# Patient Record
Sex: Male | Born: 1980
Health system: Southern US, Community
[De-identification: ages and names within clinical notes are randomized; demographics above are authoritative.]

## PROBLEM LIST (undated history)

## (undated) DIAGNOSIS — S329XXA Fracture of unspecified parts of lumbosacral spine and pelvis, initial encounter for closed fracture: Secondary | ICD-10-CM

## (undated) DIAGNOSIS — F419 Anxiety disorder, unspecified: Secondary | ICD-10-CM

## (undated) DIAGNOSIS — F909 Attention-deficit hyperactivity disorder, unspecified type: Secondary | ICD-10-CM

## (undated) DIAGNOSIS — H209 Unspecified iridocyclitis: Secondary | ICD-10-CM

## (undated) DIAGNOSIS — I10 Essential (primary) hypertension: Secondary | ICD-10-CM

## (undated) DIAGNOSIS — J939 Pneumothorax, unspecified: Secondary | ICD-10-CM

## (undated) HISTORY — PX: WISDOM TOOTH EXTRACTION: SHX21

## (undated) HISTORY — DX: Fracture of unspecified parts of lumbosacral spine and pelvis, initial encounter for closed fracture: S32.9XXA

## (undated) HISTORY — DX: Unspecified iridocyclitis: H20.9

## (undated) HISTORY — DX: Attention-deficit hyperactivity disorder, unspecified type: F90.9

## (undated) HISTORY — DX: Pneumothorax, unspecified: J93.9

## (undated) HISTORY — DX: Anxiety disorder, unspecified: F41.9

---
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
## 2008-05-14 ENCOUNTER — Ambulatory Visit: Payer: Self-pay | Admitting: Internal Medicine

## 2011-10-04 ENCOUNTER — Emergency Department (HOSPITAL_COMMUNITY)
Admission: EM | Admit: 2011-10-04 | Discharge: 2011-10-05 | Disposition: A | Discharge status: Home / Self Care | Payer: Self-pay | Attending: Emergency Medicine | Admitting: Emergency Medicine

## 2011-10-04 ENCOUNTER — Encounter: Payer: Self-pay | Admitting: *Deleted

## 2011-10-04 DIAGNOSIS — H11419 Vascular abnormalities of conjunctiva, unspecified eye: Secondary | ICD-10-CM | POA: Insufficient documentation

## 2011-10-04 DIAGNOSIS — H5789 Other specified disorders of eye and adnexa: Secondary | ICD-10-CM | POA: Insufficient documentation

## 2011-10-04 DIAGNOSIS — H209 Unspecified iridocyclitis: Secondary | ICD-10-CM | POA: Insufficient documentation

## 2011-10-04 DIAGNOSIS — H53149 Visual discomfort, unspecified: Secondary | ICD-10-CM | POA: Insufficient documentation

## 2011-10-04 DIAGNOSIS — H5710 Ocular pain, unspecified eye: Secondary | ICD-10-CM | POA: Insufficient documentation

## 2011-10-04 DIAGNOSIS — H538 Other visual disturbances: Secondary | ICD-10-CM | POA: Insufficient documentation

## 2011-10-04 MED ORDER — OXYCODONE-ACETAMINOPHEN 5-325 MG PO TABS: 1.0000 | ORAL_TABLET | Freq: Four times a day (QID) | ORAL | Status: AC | PRN

## 2011-10-04 MED ORDER — TETRACAINE HCL 0.5 % OP SOLN
2.0000 [drp] | Freq: Once | OPHTHALMIC | Status: AC
  Administered 2011-10-04: 2 [drp] via OPHTHALMIC
  Filled 2011-10-04: qty 2

## 2011-10-04 MED ORDER — FLUORESCEIN SODIUM 1 MG OP STRP
ORAL_STRIP | OPHTHALMIC | Status: AC
  Administered 2011-10-04: 23:00:00
  Filled 2011-10-04: qty 1

## 2011-10-04 MED ORDER — OXYCODONE-ACETAMINOPHEN 5-325 MG PO TABS
2.0000 | ORAL_TABLET | Freq: Once | ORAL | Status: AC
  Administered 2011-10-04: 2 via ORAL
  Filled 2011-10-04: qty 2

## 2011-10-04 NOTE — ED Notes (Signed)
Patient has been diagnosed with iritis or his right eye around New Years Day; patient was seen at the ED, given pain medication, and sent for a follow up at an eye doctor.  Patient was prescribed Cyclogyl 1% and Pred Forte; patient states that the pain has been getting worse, and that he is unable to keep his right eye open due to light sensitivity.  Patient currently rates pain 5/10 on the numerical pain scale; describes pain as "burning" and "irritation".  Patient is alert and oriented; family present at bedside.  Will continue to monitor.

## 2011-10-04 NOTE — ED Provider Notes (Signed)
History     CSN: [id number]  Arrival date & time 10/04/11  2036   First MD Initiated Contact with Patient 10/04/11 2222      Chief Complaint  Patient presents with  . Eye Pain    iritis of R eye     HPI  History provided by the patient. Patient is a 31 year old male with no significant past medical history who presents with persistent continued right eye pain for the past 6-7 days. Patient is originally from [place] and is here visiting family. Patient states that on New Year's Day he began to have gradually increasing right eye pain as if "someone poked him in the eye". Patient states he went to a hospital in [place] and then was referred to an optometrist there. He reports being diagnosed with iritis and being prescribed Pred forte eyedrops as well as cyclogyl. Patient was using these drops and returned for a followup visit a few days after and states that he continued to have increased redness to the eye and pain. He was told to continue the same drops and have another recheck later on. Today patient states he has almost run out of his eyedrops and he continues to have pain in the eye. He states the pain is worse with any bright lights. He has also been wearing a patch over his eye. He denies any fever or chills. Patient states that his vision seems blurry due to tears and has difficulty looking out of his eye. Patient denies any other symptoms.    History reviewed. No pertinent past medical history.  History reviewed. No pertinent past surgical history.  Family History  Problem Relation Age of Onset  . Hypertension Mother   . Hypertension Father   . Cancer Other     History  Substance Use Topics  . Smoking status: Current Everyday Smoker -- 0.5 packs/day  . Smokeless tobacco: Not on file  . Alcohol Use: No      Review of Systems  Constitutional: Negative for fever and chills.  Eyes: Positive for photophobia, pain, discharge and redness. Negative for itching.    All other systems reviewed and are negative.    Allergies  Vicodin  Home Medications   Current Outpatient Rx  Name Route Sig Dispense Refill  . GOODYS EXTRA STRENGTH PO Oral Take 1 packet by mouth once as needed. For pain     . CYCLOPENTOLATE HCL 1 % OP SOLN Right Eye Place 1 drop into the right eye 3 (three) times daily.      . OXYCODONE-ACETAMINOPHEN 5-325 MG PO TABS Oral Take 1-2 tablets by mouth every 4 (four) hours as needed.      Marland Kitchen PREDNISOLONE ACETATE 1 % OP SUSP Right Eye Place 1 drop into the right eye every hour.        BP 131/90  Pulse 71  Temp(Src) 98.6 F (37 C) (Oral)  Resp 20  SpO2 99%  Physical Exam  Nursing note and vitals reviewed. Constitutional: He is oriented to person, place, and time. He appears well-developed and well-nourished.  HENT:  Head: Normocephalic.  Eyes: EOM are normal. Pupils are equal, round, and reactive to light. Right eye exhibits discharge. Right conjunctiva is injected. Left pupil is round and reactive.  Slit lamp exam:      The right eye shows no corneal abrasion, no corneal flare, no corneal ulcer, no foreign body, no hyphema and no fluorescein uptake.  Cardiovascular: Normal rate and regular rhythm.   Pulmonary/Chest: Effort  normal and breath sounds normal.  Abdominal: Soft.  Neurological: He is alert and oriented to person, place, and time.  Skin: Skin is warm.  Psychiatric: He has a normal mood and affect. His behavior is normal.    ED Course  Procedures     1. Iritis       MDM  10:25 PM patient seen and evaluated. Patient in no acute distress.        Angus Seller, [place] 10/05/11 [phone]

## 2011-10-04 NOTE — ED Notes (Addendum)
C/o R eye pain since 12/31. Sudden onset. Seen at [facility] ED. Pain and sx continue. Mentions: light sensitive, HA, drainage, redness. Has taken 2 percocet, 2 advil, BC powder. Has cyclogel & pred forte. Spoke with a [employer] today.

## 2011-10-04 NOTE — ED Notes (Signed)
PA at bedside.

## 2011-10-04 NOTE — ED Notes (Signed)
Patient currently sitting up in bed; no respiratory or acute distress noted.  Patient updated on plan of care; informed patient that tetracaine and fluorescein strip has been placed at bedside for PA use.  Patient has no other questions or concerns at this time.  Will continue to monitor.

## 2011-10-06 NOTE — ED Provider Notes (Signed)
Medical screening examination/treatment/procedure(s) were performed by non-physician practitioner and as supervising physician I was immediately available for consultation/collaboration.    Suzi Roots, MD 10/06/11 [phone]

## 2012-12-20 ENCOUNTER — Emergency Department: Payer: Self-pay | Admitting: Emergency Medicine

## 2014-03-19 ENCOUNTER — Emergency Department: Payer: Self-pay | Admitting: Emergency Medicine

## 2014-03-19 LAB — HEPATIC FUNCTION PANEL A (ARMC)
Albumin: 4.6 g/dL (ref 3.4–5.0)
Alkaline Phosphatase: 75 U/L
Bilirubin, Direct: 0.1 mg/dL (ref 0.00–0.20)
Bilirubin,Total: 0.5 mg/dL (ref 0.2–1.0)
SGOT(AST): 55 U/L — ABNORMAL HIGH (ref 15–37)
SGPT (ALT): 40 U/L (ref 12–78)
Total Protein: 8.9 g/dL — ABNORMAL HIGH (ref 6.4–8.2)

## 2014-03-19 LAB — CBC WITH DIFFERENTIAL/PLATELET
BASOS ABS: 0.1 10*3/uL (ref 0.0–0.1)
BASOS PCT: 0.8 %
Eosinophil #: 0 10*3/uL (ref 0.0–0.7)
Eosinophil %: 0 %
HCT: 48.9 % (ref 40.0–52.0)
HGB: 16.1 g/dL (ref 13.0–18.0)
Lymphocyte #: 0.9 10*3/uL — ABNORMAL LOW (ref 1.0–3.6)
Lymphocyte %: 13.2 %
MCH: 30.4 pg (ref 26.0–34.0)
MCHC: 33 g/dL (ref 32.0–36.0)
MCV: 92 fL (ref 80–100)
MONOS PCT: 8.1 %
Monocyte #: 0.5 x10 3/mm (ref 0.2–1.0)
NEUTROS ABS: 5.1 10*3/uL (ref 1.4–6.5)
NEUTROS PCT: 77.9 %
PLATELETS: 196 10*3/uL (ref 150–440)
RBC: 5.3 10*6/uL (ref 4.40–5.90)
RDW: 12.7 % (ref 11.5–14.5)
WBC: 6.6 10*3/uL (ref 3.8–10.6)

## 2014-03-19 LAB — BASIC METABOLIC PANEL
ANION GAP: 13 (ref 7–16)
BUN: 27 mg/dL — ABNORMAL HIGH (ref 7–18)
CALCIUM: 10 mg/dL (ref 8.5–10.1)
CHLORIDE: 103 mmol/L (ref 98–107)
CO2: 17 mmol/L — AB (ref 21–32)
Creatinine: 1.26 mg/dL (ref 0.60–1.30)
EGFR (African American): >60
GLUCOSE: 129 mg/dL — AB (ref 65–99)
Osmolality: 273 (ref 275–301)
POTASSIUM: 3.9 mmol/L (ref 3.5–5.1)
SODIUM: 133 mmol/L — AB (ref 136–145)

## 2014-03-19 LAB — URINALYSIS, COMPLETE
BILIRUBIN, UR: NEGATIVE
GLUCOSE, UR: NEGATIVE mg/dL (ref 0–75)
Leukocyte Esterase: NEGATIVE
Nitrite: NEGATIVE
Ph: 5 (ref 4.5–8.0)
RBC,UR: 12 /HPF (ref 0–5)
SQUAMOUS EPITHELIAL: NONE SEEN
Specific Gravity: 1.03 (ref 1.003–1.030)
WBC UR: 1 /HPF (ref 0–5)

## 2014-03-19 LAB — TROPONIN I

## 2014-03-19 LAB — CK TOTAL AND CKMB (NOT AT ARMC)
CK, Total: 379 U/L — ABNORMAL HIGH
CK-MB: 1.8 ng/mL (ref 0.5–3.6)

## 2014-03-19 LAB — MAGNESIUM: Magnesium: 1.9 mg/dL

## 2014-12-04 DIAGNOSIS — F909 Attention-deficit hyperactivity disorder, unspecified type: Secondary | ICD-10-CM | POA: Insufficient documentation

## 2017-06-27 DIAGNOSIS — H109 Unspecified conjunctivitis: Secondary | ICD-10-CM | POA: Diagnosis not present

## 2017-06-28 ENCOUNTER — Emergency Department
Admission: EM | Admit: 2017-06-28 | Discharge: 2017-06-28 | Disposition: A | Discharge status: Home / Self Care | Payer: Commercial Managed Care - HMO | Attending: Emergency Medicine | Admitting: Emergency Medicine

## 2017-06-28 ENCOUNTER — Encounter: Payer: Self-pay | Admitting: Emergency Medicine

## 2017-06-28 DIAGNOSIS — H209 Unspecified iridocyclitis: Secondary | ICD-10-CM | POA: Diagnosis not present

## 2017-06-28 DIAGNOSIS — H5711 Ocular pain, right eye: Secondary | ICD-10-CM | POA: Diagnosis not present

## 2017-06-28 MED ORDER — OXYCODONE-ACETAMINOPHEN 5-325 MG PO TABS
1.0000 | ORAL_TABLET | Freq: Once | ORAL | Status: AC
  Administered 2017-06-28: 1 via ORAL
  Filled 2017-06-28: qty 1

## 2017-06-28 MED ORDER — FLUORESCEIN SODIUM 1 MG OP STRP
1.0000 | ORAL_STRIP | Freq: Once | OPHTHALMIC | Status: AC
  Administered 2017-06-28: 1 via OPHTHALMIC
  Filled 2017-06-28: qty 1

## 2017-06-28 MED ORDER — KETOROLAC TROMETHAMINE 60 MG/2ML IM SOLN
60.0000 mg | Freq: Once | INTRAMUSCULAR | Status: AC
  Administered 2017-06-28: 60 mg via INTRAMUSCULAR
  Filled 2017-06-28: qty 2

## 2017-06-28 MED ORDER — TETRACAINE HCL 0.5 % OP SOLN
2.0000 [drp] | Freq: Once | OPHTHALMIC | Status: AC
  Administered 2017-06-28: 2 [drp] via OPHTHALMIC
  Filled 2017-06-28: qty 4
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
## 2017-06-28 NOTE — Discharge Instructions (Signed)
Please go to the ophthalmologist office once you're discharged from the emergency department. I am concerned that he may have iritis and he feels that you need to have a full slit-lamp exam and have them prescribe medications.

## 2017-06-28 NOTE — ED Triage Notes (Signed)
Patient to ER for c/o right sided eye pain, photosensitivity, and redness. States redness began on Friday. Developed pain yesterday or early today. Denies any knowledge of anything hitting eye or feeling like something is in eye. Does HVAC for work.

## 2017-06-28 NOTE — ED Notes (Signed)
NAD noted at time of D/C. Pt denies questions or concerns. Pt ambulatory to the lobby at this time.  

## 2017-06-28 NOTE — ED Notes (Signed)
This RN to bedside, pt noted to be resting in bed with lights turned off, pt noted to be moving around in bed, respirations even and unlabored. Will continue to monitor for further patient needs.

## 2017-06-28 NOTE — ED Notes (Signed)
Patient unable to tolerate visual acuity test d/t pain and watering of eye on both sides.

## 2017-06-30 DIAGNOSIS — H209 Unspecified iridocyclitis: Secondary | ICD-10-CM | POA: Diagnosis not present

## 2017-07-15 DIAGNOSIS — H209 Unspecified iridocyclitis: Secondary | ICD-10-CM | POA: Diagnosis not present

## 2017-08-29 DIAGNOSIS — H209 Unspecified iridocyclitis: Secondary | ICD-10-CM | POA: Diagnosis not present

## 2017-11-23 DIAGNOSIS — H209 Unspecified iridocyclitis: Secondary | ICD-10-CM | POA: Diagnosis not present

## 2017-11-25 ENCOUNTER — Encounter: Payer: Self-pay | Admitting: Family Medicine

## 2017-11-25 ENCOUNTER — Ambulatory Visit (INDEPENDENT_AMBULATORY_CARE_PROVIDER_SITE_OTHER): Payer: 59 | Admitting: Family Medicine

## 2017-11-25 VITALS — BP 110/62 | HR 76 | Temp 97.6°F | Resp 16 | Ht 74.0 in | Wt 269.0 lb

## 2017-11-25 DIAGNOSIS — F909 Attention-deficit hyperactivity disorder, unspecified type: Secondary | ICD-10-CM | POA: Diagnosis not present

## 2017-11-25 DIAGNOSIS — Z8669 Personal history of other diseases of the nervous system and sense organs: Secondary | ICD-10-CM | POA: Insufficient documentation

## 2017-11-25 DIAGNOSIS — F419 Anxiety disorder, unspecified: Secondary | ICD-10-CM | POA: Insufficient documentation

## 2017-11-25 DIAGNOSIS — M546 Pain in thoracic spine: Secondary | ICD-10-CM

## 2017-11-25 DIAGNOSIS — G8929 Other chronic pain: Secondary | ICD-10-CM

## 2017-11-25 DIAGNOSIS — M459 Ankylosing spondylitis of unspecified sites in spine: Secondary | ICD-10-CM | POA: Insufficient documentation

## 2017-11-25 DIAGNOSIS — M545 Low back pain: Secondary | ICD-10-CM

## 2017-11-25 MED ORDER — AMPHETAMINE-DEXTROAMPHETAMINE 20 MG PO TABS: 10.0000 mg | ORAL_TABLET | Freq: Three times a day (TID) | ORAL | 0 refills | Status: DC
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
## 2017-11-25 NOTE — Assessment & Plan Note (Signed)
Suspect this is related to poor core strength, poor posture and lack of stretching Advised that this is all MSK and not neuropathic Advised that he is ok to start working out again Recommend yoga, core strengthening and aerobics in addition to weight training Could consider PT if needs additional help

## 2017-11-25 NOTE — Assessment & Plan Note (Signed)
Uncontrolled Was previously doing well on Adderall10mg  TID 3 month supply given F/u in 3 months

## 2017-11-25 NOTE — Assessment & Plan Note (Signed)
Uncontrolled, but stable Could be related to uncontrolled ADHD and chaotic life Will f/u after stable on Adderall Given coping strategies Consider therapy and SSRI

## 2017-11-25 NOTE — Assessment & Plan Note (Signed)
Doing well currently Continue to follow with [facility] annual eye exams

## 2017-11-25 NOTE — Patient Instructions (Signed)

## 2018-03-06 ENCOUNTER — Encounter: Payer: Self-pay | Admitting: Family Medicine
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
## 2018-03-10 ENCOUNTER — Ambulatory Visit (INDEPENDENT_AMBULATORY_CARE_PROVIDER_SITE_OTHER): Payer: 59 | Admitting: Family Medicine

## 2018-03-10 ENCOUNTER — Encounter: Payer: Self-pay | Admitting: Family Medicine

## 2018-03-10 VITALS — BP 130/84 | HR 79 | Temp 98.0°F | Resp 16 | Ht 74.0 in | Wt 246.0 lb

## 2018-03-10 DIAGNOSIS — Z113 Encounter for screening for infections with a predominantly sexual mode of transmission: Secondary | ICD-10-CM

## 2018-03-10 DIAGNOSIS — Z0000 Encounter for general adult medical examination without abnormal findings: Secondary | ICD-10-CM

## 2018-03-10 DIAGNOSIS — Z23 Encounter for immunization: Secondary | ICD-10-CM | POA: Diagnosis not present

## 2018-03-10 DIAGNOSIS — F909 Attention-deficit hyperactivity disorder, unspecified type: Secondary | ICD-10-CM | POA: Diagnosis not present

## 2018-03-10 DIAGNOSIS — Z114 Encounter for screening for human immunodeficiency virus [HIV]: Secondary | ICD-10-CM

## 2018-03-10 MED ORDER — AMPHETAMINE-DEXTROAMPHETAMINE 20 MG PO TABS: 10.0000 mg | ORAL_TABLET | Freq: Three times a day (TID) | ORAL | 0 refills | Status: DC

## 2018-03-10 NOTE — Patient Instructions (Signed)
Preventive Care 18-39 Years, Male Preventive care refers to lifestyle choices and visits with your health care provider that can promote health and wellness. What does preventive care include?  A yearly physical exam. This is also called an annual well check.  Dental exams once or twice a year.  Routine eye exams. Ask your health care provider how often you should have your eyes checked.  Personal lifestyle choices, including: ? Daily care of your teeth and gums. ? Regular physical activity. ? Eating a healthy diet. ? Avoiding tobacco and drug use. ? Limiting alcohol use. ? Practicing safe sex. What happens during an annual well check? The services and screenings done by your health care provider during your annual well check will depend on your age, overall health, lifestyle risk factors, and family history of disease. Counseling Your health care provider may ask you questions about your:  Alcohol use.  Tobacco use.  Drug use.  Emotional well-being.  Home and relationship well-being.  Sexual activity.  Eating habits.  Work and work Statistician.  Screening You may have the following tests or measurements:  Height, weight, and BMI.  Blood pressure.  Lipid and cholesterol levels. These may be checked every 5 years starting at age 34.  Diabetes screening. This is done by checking your blood sugar (glucose) after you have not eaten for a while (fasting).  Skin check.  Hepatitis C blood test.  Hepatitis B blood test.  Sexually transmitted disease (STD) testing.  Discuss your test results, treatment options, and if necessary, the need for more tests with your health care provider. Vaccines Your health care provider may recommend certain vaccines, such as:  Influenza vaccine. This is recommended every year.  Tetanus, diphtheria, and acellular pertussis (Tdap, Td) vaccine. You may need a Td booster every 10 years.  Varicella vaccine. You may need this if you  have not been vaccinated.  HPV vaccine. If you are 23 or younger, you may need three doses over 6 months.  Measles, mumps, and rubella (MMR) vaccine. You may need at least one dose of MMR.You may also need a second dose.  Pneumococcal 13-valent conjugate (PCV13) vaccine. You may need this if you have certain conditions and have not been vaccinated.  Pneumococcal polysaccharide (PPSV23) vaccine. You may need one or two doses if you smoke cigarettes or if you have certain conditions.  Meningococcal vaccine. One dose is recommended if you are age 65-21 years and a first-year college student living in a residence hall, or if you have one of several medical conditions. You may also need additional booster doses.  Hepatitis A vaccine. You may need this if you have certain conditions or if you travel or work in places where you may be exposed to hepatitis A.  Hepatitis B vaccine. You may need this if you have certain conditions or if you travel or work in places where you may be exposed to hepatitis B.  Haemophilus influenzae type b (Hib) vaccine. You may need this if you have certain risk factors.  Talk to your health care provider about which screenings and vaccines you need and how often you need them. This information is not intended to replace advice given to you by your health care provider. Make sure you discuss any questions you have with your health care provider. Document Released: 11/09/2001 Document Revised: 06/02/2016 Document Reviewed: 07/15/2015 Elsevier Interactive Patient Education  [employer].

## 2018-03-10 NOTE — Assessment & Plan Note (Signed)
Well controlled but does not seem to have as good of symptom control with generic medication Will write for brand name only medication Will continue current dose F/u in 3 months
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
## 2018-03-11 LAB — SPECIMEN STATUS REPORT

## 2018-03-11 LAB — CHLAMYDIA/GONOCOCCUS/TRICHOMONAS, NAA
Chlamydia by NAA: NEGATIVE
Gonococcus by NAA: NEGATIVE
Trich vag by NAA: NEGATIVE

## 2018-03-13 ENCOUNTER — Telehealth: Payer: Self-pay

## 2018-03-13 NOTE — Telephone Encounter (Signed)
-----   Message from Virginia Crews, MD sent at 03/13/2018  9:10 AM EDT ----- GC/CT and trichomonas negative.  Blood work not done yet  Brita Romp, Dionne Bucy, MD, MPH [facility] 03/13/2018 9:10 AM

## 2018-03-13 NOTE — Telephone Encounter (Signed)
Pt advised. Is planning on coming on this week or beginning of next week to have labs drawn.

## 2018-03-14 ENCOUNTER — Other Ambulatory Visit: Payer: Self-pay

## 2018-03-14 ENCOUNTER — Ambulatory Visit
Admission: RE | Admit: 2018-03-14 | Discharge: 2018-03-14 | Disposition: A | Discharge status: Home / Self Care | Payer: 59 | Source: Ambulatory Visit | Attending: Ophthalmology | Admitting: Ophthalmology

## 2018-03-14 ENCOUNTER — Other Ambulatory Visit
Admission: RE | Admit: 2018-03-14 | Discharge: 2018-03-14 | Disposition: A | Discharge status: Home / Self Care | Payer: 59 | Source: Ambulatory Visit | Attending: Ophthalmology | Admitting: Ophthalmology

## 2018-03-14 ENCOUNTER — Other Ambulatory Visit: Payer: Self-pay | Admitting: Physician Assistant

## 2018-03-14 ENCOUNTER — Other Ambulatory Visit: Payer: Self-pay | Admitting: Ophthalmology

## 2018-03-14 DIAGNOSIS — R748 Abnormal levels of other serum enzymes: Secondary | ICD-10-CM | POA: Diagnosis not present

## 2018-03-14 DIAGNOSIS — J984 Other disorders of lung: Secondary | ICD-10-CM | POA: Diagnosis not present

## 2018-03-14 DIAGNOSIS — H209 Unspecified iridocyclitis: Secondary | ICD-10-CM | POA: Insufficient documentation

## 2018-03-14 DIAGNOSIS — Z1159 Encounter for screening for other viral diseases: Secondary | ICD-10-CM

## 2018-03-14 DIAGNOSIS — D869 Sarcoidosis, unspecified: Secondary | ICD-10-CM

## 2018-03-14 DIAGNOSIS — Z0000 Encounter for general adult medical examination without abnormal findings: Secondary | ICD-10-CM | POA: Diagnosis not present

## 2018-03-15 LAB — LIPID PANEL
CHOL/HDL RATIO: 4.3 ratio (ref 0.0–5.0)
CHOLESTEROL TOTAL: 197 mg/dL (ref 100–199)
HDL: 46 mg/dL (ref >39)
LDL CALC: 129 mg/dL — AB (ref 0–99)
TRIGLYCERIDES: 110 mg/dL (ref 0–149)
VLDL Cholesterol Cal: 22 mg/dL (ref 5–40)

## 2018-03-15 LAB — CBC
HEMATOCRIT: 40.7 % (ref 37.5–51.0)
HEMOGLOBIN: 13.7 g/dL (ref 13.0–17.7)
MCH: 31.4 pg (ref 26.6–33.0)
MCHC: 33.7 g/dL (ref 31.5–35.7)
MCV: 93 fL (ref 79–97)
Platelets: 304 10*3/uL (ref 150–450)
RBC: 4.37 x10E6/uL (ref 4.14–5.80)
RDW: 12.8 % (ref 12.3–15.4)
WBC: 8.4 10*3/uL (ref 3.4–10.8)

## 2018-03-15 LAB — COMPREHENSIVE METABOLIC PANEL
A/G RATIO: 1.5 (ref 1.2–2.2)
ALT: 20 IU/L (ref 0–44)
AST: 24 IU/L (ref 0–40)
Albumin: 4.3 g/dL (ref 3.5–5.5)
Alkaline Phosphatase: 60 IU/L (ref 39–117)
BUN/Creatinine Ratio: 18 (ref 9–20)
BUN: 20 mg/dL (ref 6–20)
Bilirubin Total: 0.3 mg/dL (ref 0.0–1.2)
CALCIUM: 9.2 mg/dL (ref 8.7–10.2)
CO2: 23 mmol/L (ref 20–29)
Chloride: 103 mmol/L (ref 96–106)
Creatinine, Ser: 1.1 mg/dL (ref 0.76–1.27)
GFR, EST AFRICAN AMERICAN: 101 mL/min/{1.73_m2} (ref >59)
GFR, EST NON AFRICAN AMERICAN: 87 mL/min/{1.73_m2} (ref >59)
Globulin, Total: 2.8 g/dL (ref 1.5–4.5)
Glucose: 95 mg/dL (ref 65–99)
Potassium: 4.4 mmol/L (ref 3.5–5.2)
Sodium: 143 mmol/L (ref 134–144)
TOTAL PROTEIN: 7.1 g/dL (ref 6.0–8.5)

## 2018-03-15 LAB — ANGIOTENSIN CONVERTING ENZYME: Angiotensin-Converting Enzyme: 41 U/L (ref 14–82)

## 2018-03-15 LAB — RPR: RPR Ser Ql: NONREACTIVE

## 2018-03-15 LAB — HIV ANTIBODY (ROUTINE TESTING W REFLEX): HIV SCREEN 4TH GENERATION: NONREACTIVE

## 2018-03-15 LAB — HCV COMMENT:

## 2018-03-15 LAB — HEPATITIS C ANTIBODY (REFLEX)

## 2018-03-16 ENCOUNTER — Telehealth: Payer: Self-pay | Admitting: Family Medicine

## 2018-03-16 NOTE — Telephone Encounter (Signed)
He does not need a hepatitis panel for screening.  He did have negative Hep C and would have been vaccinated against Hep B as a child.  Virginia Crews, MD, MPH [facility] 03/16/2018 10:09 AM

## 2018-03-16 NOTE — Telephone Encounter (Signed)
-----   Message from Virginia Crews, MD sent at 03/15/2018 10:19 AM EDT ----- Normal kidney function, liver function, electrolytes, Blood counts.  Negative STD screening. Cholesterol is slightly high.  This will improve with diet low in saturated fat and regular exercise.  Virginia Crews, MD, MPH [facility] 03/15/2018 10:19 AM

## 2018-03-16 NOTE — Telephone Encounter (Signed)
Pt is requesting his lab results from 03/14/18. Please advise. Thanks TNP

## 2018-03-16 NOTE — Telephone Encounter (Signed)
Pt advised of results. He would like screening hepatitis panel checked. Would this be ok to order?

## 2018-03-16 NOTE — Telephone Encounter (Signed)
Pt advised.

## 2018-03-17 LAB — LYSOZYME, SERUM: Lysozyme: 6.1 ug/mL (ref 3.0–12.8)

## 2018-03-17 LAB — HLA-B27 ANTIGEN: HLA-B27: POSITIVE

## 2018-03-21 ENCOUNTER — Encounter: Payer: Self-pay | Admitting: Family Medicine

## 2018-03-21 DIAGNOSIS — H209 Unspecified iridocyclitis: Secondary | ICD-10-CM | POA: Diagnosis not present

## 2018-04-03 ENCOUNTER — Telehealth: Payer: Self-pay

## 2018-04-03 DIAGNOSIS — M545 Low back pain: Principal | ICD-10-CM

## 2018-04-03 DIAGNOSIS — Z1589 Genetic susceptibility to other disease: Secondary | ICD-10-CM

## 2018-04-03 DIAGNOSIS — G8929 Other chronic pain: Secondary | ICD-10-CM

## 2018-04-03 NOTE — Telephone Encounter (Signed)
Received note from [facility] regarding pt's uveitis and complaint of back pain. If pt is still experiencing back pain, Dr. Jacinto Reap is requesting xrays and a referral to rheumatology to work up possible ankylosing spondylitis. LMTCB to ask is he is still having back pain.

## 2018-04-11 DIAGNOSIS — H209 Unspecified iridocyclitis: Secondary | ICD-10-CM | POA: Diagnosis not present

## 2018-04-13 DIAGNOSIS — Z1589 Genetic susceptibility to other disease: Secondary | ICD-10-CM | POA: Insufficient documentation

## 2018-04-13 NOTE — Telephone Encounter (Signed)
Yes, pt was advised where to go for imaging, and that he may go at his convenience.

## 2018-04-13 NOTE — Telephone Encounter (Signed)
Pt is experiencing back pain still. Agrees to xray and referral. Please order.

## 2018-04-13 NOTE — Telephone Encounter (Addendum)
As Raquel Sarna said below, we received a letter from Dr. Edison Pace at [facility] eye regarding the patient's recurrent episodes of acute anterior uveitis of the right eye.  He has had about 3 episodes of this over the last 6 years.  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  He had mentioned back pain to his ophthalmologist who is concerned about possibility for ankylosing spondylitis given his HLA-B27 positive status.  As the patient is having back pain, I will order lumbar spine x-rays and refer him to a rheumatologist for further evaluation and treatment of his back pain.  Letter from [facility] will be scanned into the chart.  Does patient know to go to [facility] on [address] to get XRays?  Virginia Crews, MD, MPH [facility] 04/13/2018 11:23 AM

## 2018-05-04 DIAGNOSIS — H209 Unspecified iridocyclitis: Secondary | ICD-10-CM | POA: Diagnosis not present

## 2018-05-04 DIAGNOSIS — M546 Pain in thoracic spine: Secondary | ICD-10-CM | POA: Diagnosis not present

## 2018-05-04 DIAGNOSIS — M533 Sacrococcygeal disorders, not elsewhere classified: Secondary | ICD-10-CM | POA: Diagnosis not present

## 2018-05-04 DIAGNOSIS — M47816 Spondylosis without myelopathy or radiculopathy, lumbar region: Secondary | ICD-10-CM | POA: Diagnosis not present

## 2018-05-04 DIAGNOSIS — G8929 Other chronic pain: Secondary | ICD-10-CM | POA: Insufficient documentation

## 2018-05-09 DIAGNOSIS — H209 Unspecified iridocyclitis: Secondary | ICD-10-CM | POA: Diagnosis not present

## 2018-06-12 ENCOUNTER — Ambulatory Visit (INDEPENDENT_AMBULATORY_CARE_PROVIDER_SITE_OTHER): Payer: 59 | Admitting: Family Medicine

## 2018-06-12 ENCOUNTER — Encounter: Payer: Self-pay | Admitting: Family Medicine

## 2018-06-12 VITALS — BP 138/82 | HR 90 | Temp 98.8°F | Wt 241.6 lb

## 2018-06-12 DIAGNOSIS — Z87891 Personal history of nicotine dependence: Secondary | ICD-10-CM | POA: Insufficient documentation

## 2018-06-12 DIAGNOSIS — F17200 Nicotine dependence, unspecified, uncomplicated: Secondary | ICD-10-CM | POA: Diagnosis not present

## 2018-06-12 DIAGNOSIS — M545 Low back pain: Secondary | ICD-10-CM

## 2018-06-12 DIAGNOSIS — F909 Attention-deficit hyperactivity disorder, unspecified type: Secondary | ICD-10-CM

## 2018-06-12 DIAGNOSIS — Z1589 Genetic susceptibility to other disease: Secondary | ICD-10-CM

## 2018-06-12 DIAGNOSIS — G8929 Other chronic pain: Secondary | ICD-10-CM

## 2018-06-12 MED ORDER — AMPHETAMINE-DEXTROAMPHETAMINE 20 MG PO TABS: 20.0000 mg | ORAL_TABLET | Freq: Three times a day (TID) | ORAL | 0 refills | Status: DC

## 2018-06-12 NOTE — Progress Notes (Signed)
Patient: Joshua Bartlett Male    DOB: [birth year]   37 y.o.   MRN: [id number] Visit Date: 06/12/2018  Today's Provider: Lavon Paganini, MD   Chief Complaint  Patient presents with  . ADD Follow Up   Subjective:    I, [name], CMA, am acting as a scribe for Lavon Paganini, MD.   HPI  Follow up for ADD  The patient was last seen for this 3 months ago. Changes made at last visit include advised to continue Adderall.  He reports good compliance with treatment. He states he takes 1.5 tablets in morning, 1 tablet at lunch, 0.5 tablet between 2-3 pm.  If he takes higher doses in the afternoon, it is difficult to sleep.  He has balanced when to eat while taking medication to avoid appetite suppresion. He feels that condition is stable.   He is not having side effects.   HLA-B27 positive and chronic lumbar and thoracic back pain: Has seen Rheumatology.  Doesn't seem to have ankylosing spondylitis, though he has some features.  Will be seen again in 6 months.  He is considering seeing another Rheumatologist for a second opinion (at the behest of his mother).    Tobacco use disorder: Currently smoking 1 ppd.  He has quit in the past by vaping and working out more.  He believes that he has finally convinced himself to try to quit again.  He is too concerned about side effects to take any medications for this, but wonders if there are any other methods for quitting.  He has used the quitline in the past.   Allergies  Allergen Reactions  . Vicodin [Hydrocodone-Acetaminophen] Nausea And Vomiting     Current Outpatient Medications:  .  amphetamine-dextroamphetamine (ADDERALL) 20 MG tablet, Take 0.5 tablets (10 mg total) by mouth 3 (three) times daily., Disp: 45 tablet, Rfl: 0 .  amphetamine-dextroamphetamine (ADDERALL) 20 MG tablet, Take 0.5 tablets (10 mg total) by mouth 3 (three) times daily., Disp: 45 tablet, Rfl: 0 .  amphetamine-dextroamphetamine (ADDERALL) 20 MG tablet,  Take 0.5 tablets (10 mg total) by mouth 3 (three) times daily., Disp: 45 tablet, Rfl: 0   Review of Systems  Constitutional: Negative.   Respiratory: Negative.   Cardiovascular: Negative.   Musculoskeletal: Negative.   Psychiatric/Behavioral: Positive for decreased concentration.    Social History   Tobacco Use  . Smoking status: Current Every Day Smoker    Packs/day: 0.30    Years: 0.00    Pack years: 0.00    Types: Cigarettes  . Smokeless tobacco: Never Used  . Tobacco comment: started smoking at age 38  Substance Use Topics  . Alcohol use: No   Objective:   BP 138/82 (BP Location: Right Arm, Patient Position: Sitting, Cuff Size: Normal)   Pulse 90   Temp 98.8 F (37.1 C) (Oral)   Wt 241 lb 9.6 oz (109.6 kg)   SpO2 96%   BMI 31.02 kg/m  Vitals:   06/12/18 1558  BP: 138/82  Pulse: 90  Temp: 98.8 F (37.1 C)  TempSrc: Oral  SpO2: 96%  Weight: 241 lb 9.6 oz (109.6 kg)     Physical Exam  Constitutional: He is oriented to person, place, and time. He appears well-developed and well-nourished. No distress.  HENT:  Head: Normocephalic and atraumatic.  Mouth/Throat: Oropharynx is clear and moist.  Eyes: Conjunctivae are normal. No scleral icterus.  Neck: Neck supple. No thyromegaly present.  Cardiovascular: Normal rate, regular rhythm, normal heart  sounds and intact distal pulses.  No murmur heard. Pulmonary/Chest: Effort normal and breath sounds normal. No respiratory distress. He has no wheezes. He has no rales.  Musculoskeletal: He exhibits no edema or deformity.  Lymphadenopathy:    He has no cervical adenopathy.  Neurological: He is alert and oriented to person, place, and time.  Skin: Skin is warm and dry. Capillary refill takes less than 2 seconds. No rash noted.  Psychiatric: He has a normal mood and affect. His behavior is normal.  Vitals reviewed.       Assessment & Plan:   Problem List Items Addressed This Visit      Other   Adult ADHD -  Primary    Well-controlled Increase dose to 30 mg in the morning, 20 mg with lunch and 10 mg in the afternoon.  This seems to give him the best symptom control 76-monthprescriptions given Follow-up in 3 months      Chronic lumbar pain    Does not seem to be related to his HLA-B27 positive status or ankylosing spondylitis Given his line of work, it is most likely that he has MSK back pain As in the past, I recommended yoga, core strengthening, and aerobics in addition to his weight training He may seek a second opinion from another rheumatologist We could consider physical therapy in the future as well      HLA B27 positive    As above, patient does not seem to have ankylosing spondylitis He may seek a second opinion from another rheumatologist He will let me know if he needs a referral for this      Tobacco use disorder    3 to 5-minute discussion regarding the harms of smoking, the benefits of cessation, and methods of cessation He is not interested in Zyban or Chantix Discussed use of patches, gum, lozenges, and inhalers Discussed dangers of vaping and do not advise this method for cessation Discussed use of the quit line and how this can be helpful to have someone hold you accountable Exline we will follow-up at next visit          Return in about 3 months (around 09/11/2018) for ADD f/u.   The entirety of the information documented in the History of Present Illness, Review of Systems and Physical Exam were personally obtained by me. Portions of this information were initially documented by NTiburcio Pea CMA and reviewed by me for thoroughness and accuracy.    BVirginia Crews MD, MPH [facility]9/16/2019 4:52 PM

## 2018-06-12 NOTE — Assessment & Plan Note (Signed)
As above, patient does not seem to have ankylosing spondylitis He may seek a second opinion from another rheumatologist He will let me know if he needs a referral for this

## 2018-06-12 NOTE — Assessment & Plan Note (Signed)
3 to 5-minute discussion regarding the harms of smoking, the benefits of cessation, and methods of cessation He is not interested in Zyban or Chantix Discussed use of patches, gum, lozenges, and inhalers Discussed dangers of vaping and do not advise this method for cessation Discussed use of the quit line and how this can be helpful to have someone hold you accountable Exline we will follow-up at next visit

## 2018-06-12 NOTE — Assessment & Plan Note (Signed)
Does not seem to be related to his HLA-B27 positive status or ankylosing spondylitis Given his line of work, it is most likely that he has MSK back pain As in the past, I recommended yoga, core strengthening, and aerobics in addition to his weight training He may seek a second opinion from another rheumatologist We could consider physical therapy in the future as well

## 2018-06-12 NOTE — Assessment & Plan Note (Signed)
Well-controlled Increase dose to 30 mg in the morning, 20 mg with lunch and 10 mg in the afternoon.  This seems to give him the best symptom control 59-month prescriptions given Follow-up in 3 months

## 2018-06-12 NOTE — Patient Instructions (Signed)
Consider calling the Quitline 1-800-QUIT-NOW  Coping with Quitting Smoking Quitting smoking is a physical and mental challenge. You will face cravings, withdrawal symptoms, and temptation. Before quitting, work with your health care provider to make a plan that can help you cope. Preparation can help you quit and keep you from giving in. How can I cope with cravings? Cravings usually last for 5-10 minutes. If you get through it, the craving will pass. Consider taking the following actions to help you cope with cravings:  Keep your mouth busy: ? Chew sugar-free gum. ? Suck on hard candies or a straw. ? Brush your teeth.  Keep your hands and body busy: ? Immediately change to a different activity when you feel a craving. ? Squeeze or play with a ball. ? Do an activity or a hobby, like making bead jewelry, practicing needlepoint, or working with wood. ? Mix up your normal routine. ? Take a short exercise break. Go for a quick walk or run up and down stairs. ? Spend time in public places where smoking is not allowed.  Focus on doing something kind or helpful for someone else.  Call a friend or family member to talk during a craving.  Join a support group.  Call a quit line, such as 1-800-QUIT-NOW.  Talk with your health care provider about medicines that might help you cope with cravings and make quitting easier for you.  How can I deal with withdrawal symptoms? Your body may experience negative effects as it tries to get used to not having nicotine in the system. These effects are called withdrawal symptoms. They may include:  Feeling hungrier than normal.  Trouble concentrating.  Irritability.  Trouble sleeping.  Feeling depressed.  Restlessness and agitation.  Craving a cigarette.  To manage withdrawal symptoms:  Avoid places, people, and activities that trigger your cravings.  Remember why you want to quit.  Get plenty of sleep.  Avoid coffee and other  caffeinated drinks. These may worsen some of your symptoms.  How can I handle social situations? Social situations can be difficult when you are quitting smoking, especially in the first few weeks. To manage this, you can:  Avoid parties, bars, and other social situations where people might be smoking.  Avoid alcohol.  Leave right away if you have the urge to smoke.  Explain to your family and friends that you are quitting smoking. Ask for understanding and support.  Plan activities with friends or family where smoking is not an option.  What are some ways I can cope with stress? Wanting to smoke may cause stress, and stress can make you want to smoke. Find ways to manage your stress. Relaxation techniques can help. For example:  Breathe slowly and deeply, in through your nose and out through your mouth.  Listen to soothing, relaxing music.  Talk with a family member or friend about your stress.  Light a candle.  Soak in a bath or take a shower.  Think about a peaceful place.  What are some ways I can prevent weight gain? Be aware that many people gain weight after they quit smoking. However, not everyone does. To keep from gaining weight, have a plan in place before you quit and stick to the plan after you quit. Your plan should include:  Having healthy snacks. When you have a craving, it may help to: ? Eat plain popcorn, crunchy carrots, celery, or other cut vegetables. ? Chew sugar-free gum.  Changing how you eat: ? Eat  small portion sizes at meals. ? Eat 4-6 small meals throughout the day instead of 1-2 large meals a day. ? Be mindful when you eat. Do not watch television or do other things that might distract you as you eat.  Exercising regularly: ? Make time to exercise each day. If you do not have time for a long workout, do short bouts of exercise for 5-10 minutes several times a day. ? Do some form of strengthening exercise, like weight lifting, and some form of  aerobic exercise, like running or swimming.  Drinking plenty of water or other low-calorie or no-calorie drinks. Drink 6-8 glasses of water daily, or as much as instructed by your health care provider.  Summary  Quitting smoking is a physical and mental challenge. You will face cravings, withdrawal symptoms, and temptation to smoke again. Preparation can help you as you go through these challenges.  You can cope with cravings by keeping your mouth busy (such as by chewing gum), keeping your body and hands busy, and making calls to family, friends, or a helpline for people who want to quit smoking.  You can cope with withdrawal symptoms by avoiding places where people smoke, avoiding drinks with caffeine, and getting plenty of rest.  Ask your health care provider about the different ways to prevent weight gain, avoid stress, and handle social situations. This information is not intended to replace advice given to you by your health care provider. Make sure you discuss any questions you have with your health care provider. Document Released: 09/10/2016 Document Revised: 09/10/2016 Document Reviewed: 09/10/2016 Elsevier Interactive Patient Education  [employer].

## 2018-09-11 ENCOUNTER — Encounter: Payer: Self-pay | Admitting: Family Medicine

## 2018-09-11 ENCOUNTER — Ambulatory Visit (INDEPENDENT_AMBULATORY_CARE_PROVIDER_SITE_OTHER): Payer: 59 | Admitting: Family Medicine

## 2018-09-11 VITALS — BP 114/74 | HR 83 | Temp 98.5°F | Wt 237.2 lb

## 2018-09-11 DIAGNOSIS — F909 Attention-deficit hyperactivity disorder, unspecified type: Secondary | ICD-10-CM | POA: Diagnosis not present

## 2018-09-11 DIAGNOSIS — K439 Ventral hernia without obstruction or gangrene: Secondary | ICD-10-CM | POA: Diagnosis not present

## 2018-09-11 MED ORDER — AMPHETAMINE-DEXTROAMPHETAMINE 20 MG PO TABS: 20.0000 mg | ORAL_TABLET | Freq: Three times a day (TID) | ORAL | 0 refills | Status: DC

## 2018-09-11 NOTE — Patient Instructions (Signed)
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
## 2018-09-13 DIAGNOSIS — K439 Ventral hernia without obstruction or gangrene: Secondary | ICD-10-CM | POA: Insufficient documentation

## 2018-09-13 NOTE — Assessment & Plan Note (Signed)
Well controlled Continue adderall at current dose 15-month Rx given F/u in 3 months

## 2018-09-13 NOTE — Assessment & Plan Note (Signed)
Present for several years, but new diagnosis Stable, easily reducible Discussed seeing Surgery, but patient would like to wait on this Will continue to monitor Discussed signs of incarceration

## 2018-09-25 DIAGNOSIS — H02403 Unspecified ptosis of bilateral eyelids: Secondary | ICD-10-CM | POA: Diagnosis not present

## 2018-10-20 ENCOUNTER — Ambulatory Visit: Payer: 59 | Admitting: Family Medicine

## 2018-10-20 NOTE — Progress Notes (Deleted)
       Patient: Joshua Bartlett Male    DOB: [birth year]   38 y.o.   MRN: [id number] Visit Date: 10/20/2018  Today's Provider: Lavon Paganini, MD   No chief complaint on file.  Subjective:     Back Pain     Allergies  Allergen Reactions  . Vicodin [Hydrocodone-Acetaminophen] Nausea And Vomiting     Current Outpatient Medications:  .  amphetamine-dextroamphetamine (ADDERALL) 20 MG tablet, Take 1 tablet (20 mg total) by mouth 3 (three) times daily., Disp: 90 tablet, Rfl: 0 .  amphetamine-dextroamphetamine (ADDERALL) 20 MG tablet, Take 1 tablet (20 mg total) by mouth 3 (three) times daily., Disp: 90 tablet, Rfl: 0 .  amphetamine-dextroamphetamine (ADDERALL) 20 MG tablet, Take 1 tablet (20 mg total) by mouth 3 (three) times daily., Disp: 90 tablet, Rfl: 0  Review of Systems  Constitutional: Negative.   Respiratory: Negative.   Cardiovascular: Negative.   Musculoskeletal: Positive for back pain.    Social History   Tobacco Use  . Smoking status: Current Every Day Smoker    Packs/day: 0.30    Years: 0.00    Pack years: 0.00    Types: Cigarettes  . Smokeless tobacco: Never Used  . Tobacco comment: started smoking at age 68  Substance Use Topics  . Alcohol use: No      Objective:   There were no vitals taken for this visit. There were no vitals filed for this visit.   Physical Exam      Assessment & Plan        Lavon Paganini, MD  [facility]

## 2018-10-23 ENCOUNTER — Ambulatory Visit: Payer: Self-pay | Admitting: Family Medicine

## 2018-10-24 ENCOUNTER — Encounter: Payer: Self-pay | Admitting: Family Medicine

## 2018-10-24 ENCOUNTER — Ambulatory Visit (INDEPENDENT_AMBULATORY_CARE_PROVIDER_SITE_OTHER): Payer: 59 | Admitting: Family Medicine

## 2018-10-24 VITALS — BP 125/84 | HR 75 | Temp 97.8°F | Wt 223.8 lb

## 2018-10-24 DIAGNOSIS — G8929 Other chronic pain: Secondary | ICD-10-CM

## 2018-10-24 DIAGNOSIS — Z1589 Genetic susceptibility to other disease: Secondary | ICD-10-CM

## 2018-10-24 DIAGNOSIS — M62830 Muscle spasm of back: Secondary | ICD-10-CM | POA: Diagnosis not present

## 2018-10-24 DIAGNOSIS — M546 Pain in thoracic spine: Secondary | ICD-10-CM

## 2018-10-24 MED ORDER — CYCLOBENZAPRINE HCL 5 MG PO TABS: 5.0000 mg | ORAL_TABLET | Freq: Three times a day (TID) | ORAL | 1 refills | Status: DC | PRN

## 2018-10-24 NOTE — Patient Instructions (Addendum)
Flexeril as needed for muscle spasms  Back stretching

## 2018-10-24 NOTE — Progress Notes (Signed)
Patient: Joshua Bartlett Male    DOB: 17-Nov-1980   38 y.o.   MRN: [id number] Visit Date: 10/24/2018  Today's Provider: Lavon Paganini, MD   Chief Complaint  Patient presents with  . Back Pain   Subjective:    I, [name], CMA, am acting as a scribe for Lavon Paganini, MD.    Back Pain  This is a chronic problem. The current episode started more than 1 year ago. The problem occurs intermittently. The pain is present in the thoracic spine. The quality of the pain is described as aching, shooting and stabbing. The pain is at a severity of 10/10. Worse during: pain is worse in the morning. The symptoms are aggravated by lying down. Stiffness is present in the morning.   Patient is HLA B27 positive and has h/o multiple episodes of iritis.  He also has chronic back pain.  He was evaluated by Rheum for concern for possibility of ankylosing spondylitis.  Wanted to f.u in 6 months and recheck.  Xrays were benign at that time.    Allergies  Allergen Reactions  . Vicodin [Hydrocodone-Acetaminophen] Nausea And Vomiting     Current Outpatient Medications:  .  amphetamine-dextroamphetamine (ADDERALL) 20 MG tablet, Take 1 tablet (20 mg total) by mouth 3 (three) times daily., Disp: 90 tablet, Rfl: 0 .  amphetamine-dextroamphetamine (ADDERALL) 20 MG tablet, Take 1 tablet (20 mg total) by mouth 3 (three) times daily., Disp: 90 tablet, Rfl: 0 .  amphetamine-dextroamphetamine (ADDERALL) 20 MG tablet, Take 1 tablet (20 mg total) by mouth 3 (three) times daily., Disp: 90 tablet, Rfl: 0  Review of Systems  Constitutional: Negative.   Respiratory: Negative.   Cardiovascular: Negative.   Musculoskeletal: Positive for back pain.    Social History   Tobacco Use  . Smoking status: Current Every Day Smoker    Packs/day: 0.30    Years: 0.00    Pack years: 0.00    Types: Cigarettes  . Smokeless tobacco: Never Used  . Tobacco comment: started smoking at age 71  Substance Use Topics    . Alcohol use: No      Objective:   BP 125/84 (BP Location: Right Arm, Patient Position: Sitting, Cuff Size: Large)   Pulse 75   Temp 97.8 F (36.6 C) (Oral)   Wt 223 lb 12.8 oz (101.5 kg)   SpO2 96%   BMI 28.73 kg/m  Vitals:   10/24/18 1049  BP: 125/84  Pulse: 75  Temp: 97.8 F (36.6 C)  TempSrc: Oral  SpO2: 96%  Weight: 223 lb 12.8 oz (101.5 kg)     Physical Exam Vitals signs reviewed.  Constitutional:      General: He is not in acute distress.    Appearance: Normal appearance. He is not diaphoretic.  HENT:     Head: Normocephalic and atraumatic.     Mouth/Throat:     Pharynx: Oropharynx is clear.  Eyes:     General: No scleral icterus.    Conjunctiva/sclera: Conjunctivae normal.  Neck:     Musculoskeletal: Neck supple. No muscular tenderness.  Cardiovascular:     Rate and Rhythm: Normal rate and regular rhythm.     Pulses: Normal pulses.     Heart sounds: Normal heart sounds. No murmur.  Pulmonary:     Effort: Pulmonary effort is normal. No respiratory distress.     Breath sounds: Normal breath sounds. No wheezing or rhonchi.  Abdominal:     General: There is no  distension.     Palpations: Abdomen is soft.     Tenderness: There is no abdominal tenderness.  Musculoskeletal:     Right lower leg: No edema.     Left lower leg: No edema.     Comments: Back: No midline tenderness to palpation over spinous processes of cervical, thoracic, lumbar spine.  Range of motion is somewhat limited in hyperextension.  Normal strength and sensation of lower extremities.  Negative straight leg raise bilaterally.  Tightness and mild tenderness of paraspinal musculature of thoracic spine and latissimus dorsi bilaterally  Lymphadenopathy:     Cervical: No cervical adenopathy.  Skin:    General: Skin is warm and dry.     Capillary Refill: Capillary refill takes less than 2 seconds.     Findings: No rash.  Neurological:     Mental Status: He is alert and oriented to person,  place, and time. Mental status is at baseline.  Psychiatric:        Mood and Affect: Mood normal.        Behavior: Behavior normal.        Assessment & Plan   1. Back muscle spasm -Chronic and intermittent problem -He is working on stretching and home exercise program -He does not have any radiculopathy symptoms - I believe that this is a separate issue from his concern for ankylosing spondylitis -We will try Flexeril as needed -Also discussed NSAIDs, rest, ice, heat, stretching -Discussed return precautions  2. HLA B27 (HLA B27 positive) 3. Chronic bilateral thoracic back pain -Chronic problem -There is been some concern about his risk for ankylosing spondylitis given that he is HLA-B27 positive and has a history of multiple episodes of iritis - He would like a second opinion from another rheumatologist, so referral was placed today -As his x-rays were obtained less than 6 months ago by rheumatology, we will not repeat those today - Ambulatory referral to Rheumatology    Meds ordered this encounter  Medications  . cyclobenzaprine (FLEXERIL) 5 MG tablet    Sig: Take 1 tablet (5 mg total) by mouth 3 (three) times daily as needed for muscle spasms.    Dispense:  30 tablet    Refill:  1     Return if symptoms worsen or fail to improve.   The entirety of the information documented in the History of Present Illness, Review of Systems and Physical Exam were personally obtained by me. Portions of this information were initially documented by [name], CMA and reviewed by me for thoroughness and accuracy.    Virginia Crews, MD, MPH [facility] 10/25/2018 1:01 PM

## 2018-11-02 DIAGNOSIS — Z79899 Other long term (current) drug therapy: Secondary | ICD-10-CM | POA: Diagnosis not present

## 2018-11-02 DIAGNOSIS — M5489 Other dorsalgia: Secondary | ICD-10-CM | POA: Diagnosis not present

## 2018-11-02 DIAGNOSIS — H209 Unspecified iridocyclitis: Secondary | ICD-10-CM | POA: Diagnosis not present

## 2018-11-06 ENCOUNTER — Other Ambulatory Visit: Payer: Self-pay | Admitting: Internal Medicine

## 2018-11-06 ENCOUNTER — Other Ambulatory Visit (HOSPITAL_COMMUNITY): Payer: Self-pay | Admitting: Internal Medicine

## 2018-11-06 DIAGNOSIS — M5489 Other dorsalgia: Secondary | ICD-10-CM

## 2018-11-13 ENCOUNTER — Ambulatory Visit: Payer: 59

## 2018-11-17 DIAGNOSIS — H209 Unspecified iridocyclitis: Secondary | ICD-10-CM | POA: Diagnosis not present

## 2018-11-27 ENCOUNTER — Ambulatory Visit: Payer: 59

## 2018-11-28 ENCOUNTER — Ambulatory Visit
Admission: RE | Admit: 2018-11-28 | Discharge: 2018-11-28 | Disposition: A | Discharge status: Home / Self Care | Payer: 59 | Source: Ambulatory Visit | Attending: Family Medicine | Admitting: Family Medicine

## 2018-11-28 ENCOUNTER — Encounter: Payer: Self-pay | Admitting: Family Medicine

## 2018-11-28 ENCOUNTER — Ambulatory Visit
Admission: RE | Admit: 2018-11-28 | Discharge: 2018-11-28 | Disposition: A | Discharge status: Home / Self Care | Payer: 59 | Attending: Family Medicine | Admitting: Family Medicine

## 2018-11-28 ENCOUNTER — Other Ambulatory Visit: Payer: Self-pay

## 2018-11-28 ENCOUNTER — Ambulatory Visit (INDEPENDENT_AMBULATORY_CARE_PROVIDER_SITE_OTHER): Payer: 59 | Admitting: Family Medicine

## 2018-11-28 VITALS — BP 130/77 | HR 64 | Temp 97.7°F | Wt 224.2 lb

## 2018-11-28 DIAGNOSIS — S61309A Unspecified open wound of unspecified finger with damage to nail, initial encounter: Secondary | ICD-10-CM

## 2018-11-28 DIAGNOSIS — S6992XA Unspecified injury of left wrist, hand and finger(s), initial encounter: Secondary | ICD-10-CM

## 2018-11-28 DIAGNOSIS — S62662A Nondisplaced fracture of distal phalanx of right middle finger, initial encounter for closed fracture: Secondary | ICD-10-CM | POA: Diagnosis not present

## 2018-11-28 MED ORDER — DOXYCYCLINE HYCLATE 100 MG PO TABS: 100.0000 mg | ORAL_TABLET | Freq: Two times a day (BID) | ORAL | 0 refills | Status: AC

## 2018-11-28 NOTE — Patient Instructions (Signed)
Nail Avulsion  Nail avulsion is when a nail tears away from the nail bed due to an accident or injury. Nail avulsion can be painful. Your finger or toe may bleed a lot, and you may have some pain, redness, throbbing, and swelling while it heals. Your nail will grow back within several months. Once it grows back, it might not look the same as the old nail. This may happen even after taking good care of it.  Follow these instructions at home:  Wound care  · Follow instructions from your health care provider about how to take care of your wound. Make sure you:  ? Wash your hands with soap and water before and after you change your bandage (dressing). If soap and water are not available, use hand sanitizer.  ? Change your dressing as told by your health care provider.  ? Leave stitches (sutures), skin glue, or adhesive strips in place, if present. These skin closures may need to stay in place for 2 weeks or longer. If adhesive strip edges start to loosen and curl up, you may trim the loose edges. Do not remove adhesive strips completely unless your health care provider tells you to do that.  · Check your wound every day for signs of infection. Check for:  ? Redness, swelling, or pain.  ? Fluid or blood.  ? Warmth.  ? Pus or a bad smell.  · Do not take baths, swim, or use a hot tub until your health care provider approves. Ask your health care provider if you may take showers. You may only be allowed to take sponge baths.  · If you notice bleeding, press gently on the nail bed with a gauze pad. Do this for 15 minutes.  · Keep the wound dry for 48 hours. After 48 hours have passed, lightly wash the finger or toe in warm, soapy water 2-3 times a day. This helps to reduce pain and swelling. It also prevents infection.  Medicine  · Take over-the-counter and prescription medicines only as told by your health care provider. Do not take aspirin or products containing aspirin unless directed by your health care provider. These  products can increase bleeding.  · If you were prescribed an antibiotic medicine, take it or apply it as told by your health care provider. Do not stop taking or using the antibiotic even if you start to feel better.  General instructions    · Keep the injured hand or foot raised above the level of your heart as much as possible in the first 48 hours after the injury. This helps to reduce pain and swelling.  · Move the toe or finger often to avoid stiffness.  · Do not use any products that contain nicotine or tobacco, such as cigarettes, e-cigarettes, and chewing tobacco. These can delay healing. If you need help quitting, ask your health care provider.  Contact a health care provider if:  · You have increased redness, swelling, or pain around your wound.  · You have fluid or blood coming from your wound.  · You have pus or a bad smell coming from your wound.  · Your wound or the area around your wound feels warm to the touch.  Get help right away if:  · You have bleeding that does not stop, even when you apply pressure to the wound.  · You have a temperature that is higher than 100.4°F (38°C).  · The affected finger or toe looks white or black.    Summary  · Nail avulsion is when a nail tears away from the nail bed due to an accident or injury.  · Follow instructions from your health care provider about how to take care of your wound.  · Keep the injured hand or foot raised above the level of your heart as much as possible in the first 48 hours after the injury. This helps to reduce pain and swelling.  · Check your wound every day for signs of infection.  · Contact a health care provider if you have increased redness, swelling, or pain around your wound.  This information is not intended to replace advice given to you by your health care provider. Make sure you discuss any questions you have with your health care provider.  Document Released: 10/21/2004 Document Revised: 05/10/2018 Document Reviewed:  05/10/2018  Elsevier Interactive Patient Education © 2019 [employer]

## 2018-11-28 NOTE — Progress Notes (Signed)
Patient: Joshua Bartlett Male    DOB: 1981-06-01   38 y.o.   MRN: [id number] Visit Date: 11/28/2018  Today's Provider: Lavon Paganini, MD   Chief Complaint  Patient presents with  . Hand Injury   Subjective:     Hand Injury   The incident occurred 3 to 5 days ago. The incident occurred at work. The injury mechanism was a direct blow (hit with hammer). The pain is present in the left hand (middle finger). The quality of the pain is described as aching, burning, cramping, shooting and stabbing. The pain is at a severity of 6/10. The pain is moderate. The pain has been constant since the incident. Associated symptoms comments: Pt reports that the finger is still bleeding as of this am when he changed the bandage.. The symptoms are aggravated by movement. He has tried NSAIDs, acetaminophen and elevation (Pt only took medication on Saturday) for the symptoms. The treatment provided mild relief.    Allergies  Allergen Reactions  . Vicodin [Hydrocodone-Acetaminophen] Nausea And Vomiting     Current Outpatient Medications:  .  amphetamine-dextroamphetamine (ADDERALL) 20 MG tablet, Take 1 tablet (20 mg total) by mouth 3 (three) times daily., Disp: 90 tablet, Rfl: 0 .  amphetamine-dextroamphetamine (ADDERALL) 20 MG tablet, Take 1 tablet (20 mg total) by mouth 3 (three) times daily., Disp: 90 tablet, Rfl: 0 .  amphetamine-dextroamphetamine (ADDERALL) 20 MG tablet, Take 1 tablet (20 mg total) by mouth 3 (three) times daily., Disp: 90 tablet, Rfl: 0 .  cyclobenzaprine (FLEXERIL) 5 MG tablet, Take 1 tablet (5 mg total) by mouth 3 (three) times daily as needed for muscle spasms., Disp: 30 tablet, Rfl: 1  Review of Systems  Constitutional: Negative.   HENT: Negative.   Eyes: Negative.   Respiratory: Negative.   Cardiovascular: Negative.   Gastrointestinal: Negative.   Endocrine: Negative.   Genitourinary: Negative.   Musculoskeletal: Negative.   Skin: Negative.     Allergic/Immunologic: Negative.   Neurological: Negative.   Hematological: Negative.   Psychiatric/Behavioral: Negative.     Social History   Tobacco Use  . Smoking status: Current Every Day Smoker    Packs/day: 0.30    Years: 0.00    Pack years: 0.00    Types: Cigarettes  . Smokeless tobacco: Never Used  . Tobacco comment: started smoking at age 43  Substance Use Topics  . Alcohol use: No      Objective:   BP 130/77 (BP Location: Right Arm, Patient Position: Sitting, Cuff Size: Normal)   Pulse 64   Temp 97.7 F (36.5 C) (Oral)   Wt 224 lb 3.2 oz (101.7 kg)   SpO2 98%   BMI 28.79 kg/m  Vitals:   11/28/18 0855  BP: 130/77  Pulse: 64  Temp: 97.7 F (36.5 C)  TempSrc: Oral  SpO2: 98%  Weight: 224 lb 3.2 oz (101.7 kg)     Physical Exam Vitals signs reviewed.  Constitutional:      General: He is not in acute distress.    Appearance: Normal appearance. He is not diaphoretic.  HENT:     Head: Normocephalic and atraumatic.  Cardiovascular:     Rate and Rhythm: Normal rate and regular rhythm.     Pulses: Normal pulses.  Pulmonary:     Effort: Pulmonary effort is normal. No respiratory distress.  Musculoskeletal:     Right lower leg: No edema.     Left lower leg: No edema.  Comments: L middle finger: Nail avulsed and loosely sitting on top of nail bed.  Ulnar side of nail bed bleeding with some maceration. Significant bruising noted under nail.  Swelling of DIP with inability to flex well. Good cap refill. Sensation intact.  Skin:    General: Skin is warm and dry.     Capillary Refill: Capillary refill takes less than 2 seconds.     Findings: No rash.  Neurological:     Mental Status: He is alert and oriented to person, place, and time. Mental status is at baseline.     Sensory: No sensory deficit.     Motor: No weakness.     Gait: Gait normal.  Psychiatric:        Mood and Affect: Mood normal.        Behavior: Behavior normal.        Assessment &  Plan     1. Injury of finger of left hand, initial encounter 2. Nail avulsion, finger, initial encounter - new problem s/p hitting tip of finger with hammer - nail avulsed and significant bruising and swelling concerning for possible underlying fracture - will obtain XRay - may need Ortho referral pending XRay results - given possibility of open fracture with nail avulsion, will treat empiricially with Doxycycline - DG Finger Middle Left; Future   Meds ordered this encounter  Medications  . doxycycline (VIBRA-TABS) 100 MG tablet    Sig: Take 1 tablet (100 mg total) by mouth 2 (two) times daily for 7 days.    Dispense:  14 tablet    Refill:  0     Return if symptoms worsen or fail to improve.   The entirety of the information documented in the History of Present Illness, Review of Systems and Physical Exam were personally obtained by me. Portions of this information were initially documented by [name] and Lia Foyer, CMA and reviewed by me for thoroughness and accuracy.    Virginia Crews, MD, MPH [facility] 11/28/2018 10:30 AM

## 2018-11-29 ENCOUNTER — Telehealth: Payer: Self-pay

## 2018-11-29 DIAGNOSIS — S62663A Nondisplaced fracture of distal phalanx of left middle finger, initial encounter for closed fracture: Secondary | ICD-10-CM

## 2018-11-29 NOTE — Telephone Encounter (Signed)
-----   Message from Virginia Crews, MD sent at 11/29/2018 11:57 AM EST ----- Does have fracture of his finger.  Should refer to Hand surgery at [facility]. Please place order for phalanx fracture

## 2018-11-29 NOTE — Telephone Encounter (Signed)
Patient was advised and referral placed. 

## 2018-11-30 DIAGNOSIS — S62633A Displaced fracture of distal phalanx of left middle finger, initial encounter for closed fracture: Secondary | ICD-10-CM | POA: Diagnosis not present

## 2018-12-11 ENCOUNTER — Other Ambulatory Visit: Payer: Self-pay | Admitting: Family Medicine

## 2018-12-11 DIAGNOSIS — S62633A Displaced fracture of distal phalanx of left middle finger, initial encounter for closed fracture: Secondary | ICD-10-CM | POA: Diagnosis not present

## 2018-12-11 NOTE — Telephone Encounter (Signed)
Patient called to see if this had been done yet.  While talking to him and telling him there is an office policy that gives [place] 48 hours to refill med, he said that it would be ok he has the hard copy he just thought he could get you to send in another one so he did not have to turn it in and then go back to pick it up.    I told him that if he had the hard copy he would need to use it that we could not call in another.

## 2018-12-11 NOTE — Telephone Encounter (Signed)
Pt needing refill on:  amphetamine-dextroamphetamine (ADDERALL) 20 MG tablet  Please call into:  [facility] Address: [address], [place], [place] [address] Phone: [phone]  Thanks, [facility]

## 2018-12-11 NOTE — Telephone Encounter (Signed)
Looks like 3 months worth were printed in 08/2018.  It appears he has only filled one of these prescriptions.  Will need to use these before more refills given

## 2018-12-15 ENCOUNTER — Ambulatory Visit (INDEPENDENT_AMBULATORY_CARE_PROVIDER_SITE_OTHER): Payer: 59 | Admitting: Family Medicine

## 2018-12-15 DIAGNOSIS — M545 Low back pain: Secondary | ICD-10-CM

## 2018-12-15 DIAGNOSIS — Z1589 Genetic susceptibility to other disease: Secondary | ICD-10-CM

## 2018-12-15 DIAGNOSIS — S62663D Nondisplaced fracture of distal phalanx of left middle finger, subsequent encounter for fracture with routine healing: Secondary | ICD-10-CM | POA: Diagnosis not present

## 2018-12-15 DIAGNOSIS — S62663A Nondisplaced fracture of distal phalanx of left middle finger, initial encounter for closed fracture: Secondary | ICD-10-CM | POA: Insufficient documentation

## 2018-12-15 DIAGNOSIS — G8929 Other chronic pain: Secondary | ICD-10-CM

## 2018-12-15 DIAGNOSIS — F909 Attention-deficit hyperactivity disorder, unspecified type: Secondary | ICD-10-CM

## 2018-12-15 NOTE — Assessment & Plan Note (Signed)
Healing well per Ortho Discussed that fingernail is acting as protective shield for nail bed and to leave in place if possible

## 2018-12-15 NOTE — Assessment & Plan Note (Signed)
Well controlled Continue Adderall at current dose Refill current Rx on hand and can call for further refills F/u in 4 months

## 2018-12-15 NOTE — Assessment & Plan Note (Signed)
Being worked up by rheumatology for HLAB27 + status and possible AS Continue Naproxen and flexeril prn Have discussed exercises and yoga may be helpful

## 2018-12-15 NOTE — Progress Notes (Signed)
Patient: Joshua Bartlett Male    DOB: 01/10/1981   38 y.o.   MRN: [id number] Visit Date: 12/18/2018  Today's Provider: Lavon Paganini, MD   Chief Complaint  Patient presents with  . Follow-up   Subjective:      Virtual Visit via Video Note  I connected with Joshua Bartlett on 12/18/18 at  2:40 PM EDT by a video enabled telemedicine application and verified that I am speaking with the correct person using two identifiers.   I discussed the limitations of evaluation and management by telemedicine and the availability of in person appointments. The patient expressed understanding and agreed to proceed.  . Location of the patient: home . Location of the provider : office - [place]   HPI  Distal phalanx fracture: Office visit 11/28/2018 showed distal phalanx fracture.  Saw Ortho.  Reported it was closed and healing well.  Ok to keep taped and continue to watch.  F/u prn.  HLAB27 positive and back pain: ?Ankylosing spondylitis. Follwoed by Rheum.  Was Rx'd Naproxen, PPI - not taking PPI. Naproxen helps. MRI was ordered but patient did not get as he is waiting on 2nd opinion visit with [facility].  Flexeril also helps some  ADHD F/u - last seen 09/11/18. Doing well on Adderall.  Has not taken consistently.  Has only filled 1 of 3 Rxs from that time.  States that it works well when he is taking it.  Denies HTN, headaches, insomnia, appetite suppression, weight loss.    Allergies  Allergen Reactions  . Vicodin [Hydrocodone-Acetaminophen] Nausea And Vomiting     Current Outpatient Medications:  .  amphetamine-dextroamphetamine (ADDERALL) 20 MG tablet, Take 1 tablet (20 mg total) by mouth 3 (three) times daily., Disp: 90 tablet, Rfl: 0 .  amphetamine-dextroamphetamine (ADDERALL) 20 MG tablet, Take 1 tablet (20 mg total) by mouth 3 (three) times daily., Disp: 90 tablet, Rfl: 0 .  amphetamine-dextroamphetamine (ADDERALL) 20 MG tablet, Take 1 tablet (20 mg total)  by mouth 3 (three) times daily., Disp: 90 tablet, Rfl: 0 .  cyclobenzaprine (FLEXERIL) 5 MG tablet, Take 1 tablet (5 mg total) by mouth 3 (three) times daily as needed for muscle spasms., Disp: 30 tablet, Rfl: 1  Review of Systems  Constitutional: Negative.   HENT: Negative.   Respiratory: Negative.   Cardiovascular: Negative.   Gastrointestinal: Negative.   Genitourinary: Negative.   Musculoskeletal: Positive for arthralgias and back pain. Negative for gait problem.  Neurological: Negative.   Psychiatric/Behavioral: Negative.     Social History   Tobacco Use  . Smoking status: Current Every Day Smoker    Packs/day: 0.30    Years: 0.00    Pack years: 0.00    Types: Cigarettes  . Smokeless tobacco: Never Used  . Tobacco comment: started smoking at age 32  Substance Use Topics  . Alcohol use: No      Objective:    Physical Exam Constitutional:      General: He is not in acute distress.    Appearance: Normal appearance. He is not diaphoretic.  HENT:     Head: Normocephalic and atraumatic.  Eyes:     General: No scleral icterus.    Conjunctiva/sclera: Conjunctivae normal.  Pulmonary:     Effort: Pulmonary effort is normal. No respiratory distress.  Musculoskeletal:     Comments: L middle finger with finger nail avulsed from base and attached at tip. No further bleeding.  Neurological:  Mental Status: He is alert and oriented to person, place, and time. Mental status is at baseline.  Psychiatric:        Mood and Affect: Mood normal.        Behavior: Behavior normal.        Thought Content: Thought content normal.         Assessment & Plan   Problem List Items Addressed This Visit      Musculoskeletal and Integument   Closed nondisplaced fracture of distal phalanx of left middle finger    Healing well per Ortho Discussed that fingernail is acting as protective shield for nail bed and to leave in place if possible        Other   Adult ADHD - Primary     Well controlled Continue Adderall at current dose Refill current Rx on hand and can call for further refills F/u in 4 months      Chronic lumbar pain    Being worked up by rheumatology for HLAB27 + status and possible AS Continue Naproxen and flexeril prn Have discussed exercises and yoga may be helpful      HLA B27 positive    See above for chronic back pain Awaiting 2nd opinion from another Rheum office          Return in about 4 months (around 04/16/2019) for ADD f/u.    Follow Up Instructions:    I discussed the assessment and treatment plan with the patient. The patient was provided an opportunity to ask questions and all were answered. The patient agreed with the plan and demonstrated an understanding of the instructions.   The patient was advised to call back or seek an in-person evaluation if the symptoms worsen or if the condition fails to improve as anticipated.  I provided 25 minutes of non-face-to-face time during this encounter.    Virginia Crews, MD, MPH [facility] 12/18/2018 8:35 AM

## 2018-12-15 NOTE — Patient Instructions (Addendum)
Naproxen is anti-inflammatory - use as needed for back Omeprazole is to protect stomach lining while on Naproxen  Flexeril for muscle spasms as needed.  Fill prescriptions for Adderall.  Let me know when you run out and I will refill at that time.

## 2018-12-18 ENCOUNTER — Encounter: Payer: Self-pay | Admitting: Family Medicine

## 2018-12-18 NOTE — Assessment & Plan Note (Signed)
See above for chronic back pain Awaiting 2nd opinion from another Rheum office

## 2018-12-25 ENCOUNTER — Ambulatory Visit: Payer: Self-pay | Admitting: Rheumatology

## 2019-01-17 ENCOUNTER — Ambulatory Visit: Payer: Self-pay | Admitting: Rheumatology

## 2019-01-18 ENCOUNTER — Telehealth: Payer: Self-pay

## 2019-01-18 NOTE — Telephone Encounter (Signed)
LVM PHQ-2 Screening

## 2019-01-22 ENCOUNTER — Encounter: Payer: Self-pay | Admitting: Family Medicine

## 2019-01-22 ENCOUNTER — Ambulatory Visit (INDEPENDENT_AMBULATORY_CARE_PROVIDER_SITE_OTHER): Payer: 59 | Admitting: Family Medicine

## 2019-01-22 DIAGNOSIS — B354 Tinea corporis: Secondary | ICD-10-CM | POA: Diagnosis not present

## 2019-01-22 MED ORDER — CYCLOBENZAPRINE HCL 5 MG PO TABS: 5.0000 mg | ORAL_TABLET | Freq: Three times a day (TID) | ORAL | 1 refills | Status: DC | PRN

## 2019-01-22 MED ORDER — TERBINAFINE HCL 250 MG PO TABS: 250.0000 mg | ORAL_TABLET | Freq: Every day | ORAL | 0 refills | Status: AC

## 2019-01-22 NOTE — Patient Instructions (Signed)

## 2019-01-22 NOTE — Progress Notes (Signed)
Patient: Joshua Bartlett Male    DOB: 19-Feb-1981   38 y.o.   MRN: [id number] Visit Date: 01/22/2019  Today's Provider: Lavon Paganini, MD   Chief Complaint  Patient presents with  . Rash   Subjective:    Virtual Visit via Video Note  I connected with Joshua Bartlett on 01/22/19 at  3:00 PM EDT by a video enabled telemedicine application and verified that I am speaking with the correct person using two identifiers.   I discussed the limitations of evaluation and management by telemedicine and the availability of in person appointments. The patient expressed understanding and agreed to proceed.   Patient location: home Provider location: home office Persons involved in the visit: patient, provider   Rash  This is a recurrent problem. The problem has been waxing and waning since onset. The affected locations include the left lower leg and right lower leg. Rash characteristics: circular and red. He was exposed to nothing. Past treatments include nothing.   States he was diagnosed in the past and it got better with antifungal cream.  When it came back up, it seemed to be over larger areas of his body.  He tried topical cream without any relief.  Describes rash as red with raised borders and central clearing.  Pruritic.     Allergies  Allergen Reactions  . Vicodin [Hydrocodone-Acetaminophen] Nausea And Vomiting     Current Outpatient Medications:  .  amphetamine-dextroamphetamine (ADDERALL) 20 MG tablet, Take 1 tablet (20 mg total) by mouth 3 (three) times daily., Disp: 90 tablet, Rfl: 0 .  amphetamine-dextroamphetamine (ADDERALL) 20 MG tablet, Take 1 tablet (20 mg total) by mouth 3 (three) times daily., Disp: 90 tablet, Rfl: 0 .  amphetamine-dextroamphetamine (ADDERALL) 20 MG tablet, Take 1 tablet (20 mg total) by mouth 3 (three) times daily., Disp: 90 tablet, Rfl: 0 .  cyclobenzaprine (FLEXERIL) 5 MG tablet, Take 1 tablet (5 mg total) by mouth 3 (three) times daily as  needed for muscle spasms., Disp: 30 tablet, Rfl: 1 .  terbinafine (LAMISIL) 250 MG tablet, Take 1 tablet (250 mg total) by mouth daily for 10 days., Disp: 10 tablet, Rfl: 0  Review of Systems  Constitutional: Negative.   Respiratory: Negative.   Cardiovascular: Negative.   Musculoskeletal: Negative.   Skin: Positive for rash.    Social History   Tobacco Use  . Smoking status: Current Every Day Smoker    Packs/day: 0.30    Years: 0.00    Pack years: 0.00    Types: Cigarettes  . Smokeless tobacco: Never Used  . Tobacco comment: started smoking at age 89  Substance Use Topics  . Alcohol use: No      Objective:   There were no vitals taken for this visit. There were no vitals filed for this visit.   Physical Exam Constitutional:      Appearance: Normal appearance.  Pulmonary:     Effort: Pulmonary effort is normal. No respiratory distress.  Skin:    Comments: Unable to view rash well due to poor video quality  Neurological:     Mental Status: He is alert and oriented to person, place, and time. Mental status is at baseline.  Psychiatric:        Mood and Affect: Mood normal.        Behavior: Behavior normal.         Assessment & Plan    I discussed the assessment and treatment plan with  the patient. The patient was provided an opportunity to ask questions and all were answered. The patient agreed with the plan and demonstrated an understanding of the instructions.   The patient was advised to call back or seek an in-person evaluation if the symptoms worsen or if the condition fails to improve as anticipated.  1. Tinea corporis - discussed that this sounds like tinea corporis as it initially improved with antifungal, but that differential includes numular eczema - will start oral treatment as it has spread and failed topical therapy - lamisil daily x10 days - if not improving on antifungal therapy, consider topical steroid treatment for possible eczema - discussed  return precautions    Meds ordered this encounter  Medications  . cyclobenzaprine (FLEXERIL) 5 MG tablet    Sig: Take 1 tablet (5 mg total) by mouth 3 (three) times daily as needed for muscle spasms.    Dispense:  30 tablet    Refill:  1  . terbinafine (LAMISIL) 250 MG tablet    Sig: Take 1 tablet (250 mg total) by mouth daily for 10 days.    Dispense:  10 tablet    Refill:  0     Return if symptoms worsen or fail to improve.   The entirety of the information documented in the History of Present Illness, Review of Systems and Physical Exam were personally obtained by me. Portions of this information were initially documented by [name], CMA and reviewed by me for thoroughness and accuracy.    Virginia Crews, MD, MPH [facility] 01/22/2019 4:02 PM

## 2019-02-27 ENCOUNTER — Other Ambulatory Visit: Payer: Self-pay | Admitting: Family Medicine

## 2019-02-27 NOTE — Telephone Encounter (Signed)
Needs refill on   Adderall 20 mg  Flexeril 5 mg  [facility]worm medication?  [facility]

## 2019-02-28 MED ORDER — AMPHETAMINE-DEXTROAMPHETAMINE 20 MG PO TABS: 20.0000 mg | ORAL_TABLET | Freq: Three times a day (TID) | ORAL | 0 refills | Status: DC

## 2019-02-28 MED ORDER — CYCLOBENZAPRINE HCL 5 MG PO TABS: 5.0000 mg | ORAL_TABLET | Freq: Three times a day (TID) | ORAL | 1 refills | Status: DC | PRN

## 2019-03-02 ENCOUNTER — Telehealth: Payer: Self-pay

## 2019-03-02 NOTE — Telephone Encounter (Signed)
Okay to refill then

## 2019-03-02 NOTE — Telephone Encounter (Signed)
LMTCB

## 2019-03-02 NOTE — Telephone Encounter (Signed)
Patient states that the rash got better within three days of use of the medication. Please advise

## 2019-03-02 NOTE — Telephone Encounter (Signed)
Patient is requesting a refill on Terbinafine for ringworm on his elbow.

## 2019-03-02 NOTE — Telephone Encounter (Signed)
If it is not gone after that treatment, it may be eczema rather than ringworm.  Did he get any relief from initial treatment?

## 2019-03-05 MED ORDER — TERBINAFINE HCL 250 MG PO TABS: 250.0000 mg | ORAL_TABLET | Freq: Every day | ORAL | 0 refills | Status: AC

## 2019-03-05 NOTE — Telephone Encounter (Signed)
Medication is not on patient's med list. Place clarify dose and sig

## 2019-03-05 NOTE — Telephone Encounter (Signed)
Rx sent 

## 2019-04-06 ENCOUNTER — Encounter: Payer: Self-pay | Admitting: Emergency Medicine

## 2019-04-06 ENCOUNTER — Other Ambulatory Visit: Payer: Self-pay

## 2019-04-06 ENCOUNTER — Ambulatory Visit (INDEPENDENT_AMBULATORY_CARE_PROVIDER_SITE_OTHER): Payer: 59

## 2019-04-06 ENCOUNTER — Ambulatory Visit
Admission: EM | Admit: 2019-04-06 | Discharge: 2019-04-06 | Disposition: A | Discharge status: Home / Self Care | Payer: 59 | Attending: Family Medicine | Admitting: Family Medicine

## 2019-04-06 ENCOUNTER — Ambulatory Visit
Admission: EM | Admit: 2019-04-06 | Discharge: 2019-04-06 | Disposition: A | Discharge status: Home / Self Care | Payer: No Typology Code available for payment source | Attending: Family Medicine | Admitting: Family Medicine

## 2019-04-06 DIAGNOSIS — R0789 Other chest pain: Secondary | ICD-10-CM | POA: Diagnosis not present

## 2019-04-06 DIAGNOSIS — R079 Chest pain, unspecified: Secondary | ICD-10-CM | POA: Insufficient documentation
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
## 2019-04-06 NOTE — Discharge Instructions (Signed)
Recommend follow up with Primary Care Provider for further evaluation/possible referral

## 2019-04-06 NOTE — ED Triage Notes (Signed)
Patient c/o mid chest pain that started this morning. He denies any other symptoms.

## 2019-04-06 NOTE — ED Triage Notes (Signed)
Patient c/o mid chest pain that started this morning. Denies any other symptoms. Father recently passed away from MI.

## 2019-04-09 ENCOUNTER — Encounter: Payer: Self-pay | Admitting: Emergency Medicine

## 2019-05-03 ENCOUNTER — Other Ambulatory Visit: Payer: Self-pay | Admitting: Family Medicine

## 2019-05-03 MED ORDER — AMPHETAMINE-DEXTROAMPHETAMINE 20 MG PO TABS: 20.0000 mg | ORAL_TABLET | Freq: Three times a day (TID) | ORAL | 0 refills | Status: DC

## 2019-05-03 NOTE — Telephone Encounter (Signed)
Pt needing a refill on:  amphetamine-dextroamphetamine (ADDERALL) 20 MG tablet  Please fill at:  [id number] [name], [place] [phone] (Phone) [phone] (Fax)   Thanks, [employer]

## 2019-05-03 NOTE — Telephone Encounter (Signed)
One month supply sent. Due for f/u appt. Can be virtual

## 2019-05-10 ENCOUNTER — Telehealth: Payer: Self-pay | Admitting: Family Medicine

## 2019-05-10 NOTE — Telephone Encounter (Signed)
bPt called last week for a refill on his Adderall 20 mg.  He wants the generic brand if possible  He said it was sent to [facility]   [facility]  He does not want anything sent to [facility] anymore  CB#  [phone]  Thanks  Joshua Bartlett

## 2019-05-11 MED ORDER — AMPHETAMINE-DEXTROAMPHETAMINE 20 MG PO TABS: 20.0000 mg | ORAL_TABLET | Freq: Three times a day (TID) | ORAL | 0 refills | Status: DC

## 2019-05-11 NOTE — Telephone Encounter (Signed)
Rx sent. Please call and cancel Adderall at [facility]

## 2019-05-15 NOTE — Telephone Encounter (Signed)
rx canceled

## 2019-06-13 ENCOUNTER — Other Ambulatory Visit: Payer: Self-pay | Admitting: Family Medicine

## 2019-06-13 NOTE — Telephone Encounter (Signed)
Pt needs refill on Adderall 20 mg  [facility] [place]

## 2019-06-14 NOTE — Telephone Encounter (Signed)
LMTCB. Patient needs a follow up appointment scheduled it can be in person or virtual.

## 2019-06-14 NOTE — Telephone Encounter (Signed)
Overdue for follow-up. Can be virtual or in person

## 2019-06-15 DIAGNOSIS — Z23 Encounter for immunization: Secondary | ICD-10-CM | POA: Diagnosis not present

## 2019-06-20 ENCOUNTER — Telehealth (INDEPENDENT_AMBULATORY_CARE_PROVIDER_SITE_OTHER): Payer: BC Managed Care – PPO | Admitting: Family Medicine

## 2019-06-20 ENCOUNTER — Encounter: Payer: Self-pay | Admitting: Family Medicine

## 2019-06-20 DIAGNOSIS — G8929 Other chronic pain: Secondary | ICD-10-CM

## 2019-06-20 DIAGNOSIS — F909 Attention-deficit hyperactivity disorder, unspecified type: Secondary | ICD-10-CM

## 2019-06-20 DIAGNOSIS — M546 Pain in thoracic spine: Secondary | ICD-10-CM

## 2019-06-20 DIAGNOSIS — M545 Low back pain: Secondary | ICD-10-CM | POA: Diagnosis not present

## 2019-06-20 MED ORDER — AMPHETAMINE-DEXTROAMPHETAMINE 20 MG PO TABS: 20.0000 mg | ORAL_TABLET | Freq: Three times a day (TID) | ORAL | 0 refills | Status: DC

## 2019-06-20 MED ORDER — CYCLOBENZAPRINE HCL 5 MG PO TABS: 5.0000 mg | ORAL_TABLET | Freq: Three times a day (TID) | ORAL | 1 refills | Status: DC | PRN

## 2019-06-20 NOTE — Assessment & Plan Note (Signed)
Well controlled, no side effects Continue adderall at current dose Refilled Adderral x3 F/u in 4 months

## 2019-06-20 NOTE — Assessment & Plan Note (Signed)
Chronic Continue naproxen and flexeril prn

## 2019-06-20 NOTE — Progress Notes (Signed)
Patient: ABDURRAHEEM Bartlett Male    DOB: Jan 09, 1981   38 y.o.   MRN: [id number] Visit Date: 06/20/2019  Today's Provider: Lavon Paganini, MD   Chief Complaint  Patient presents with  . Adult ADHD   Subjective:    I, Joshua Bartlett CMA, am acting as a Education administrator for Joshua Paganini, MD.  Virtual Visit via Video Note  I connected with Joshua Bartlett on 06/20/19 at  3:20 PM EDT by a video enabled telemedicine application and verified that I am speaking with the correct person using two identifiers.   Patient location: home Provider location: [place] involved in the visit: patient, provider   I discussed the limitations of evaluation and management by telemedicine and the availability of in person appointments. The patient expressed understanding and agreed to proceed.   HPI  Adult ADHD Patient presents today via virtual visit for 6 month adult ADHD follow-up. Patient last office visit was on 12/15/2018. Patient was advised to continue his Adderall at current dose.  He states he is taking with good compliance and it is controlling him well.   Continues to have chronic back pain.  Takes Naproxen and flexeril prn   Allergies  Allergen Reactions  . Hydrocodone   . Vicodin [Hydrocodone-Acetaminophen] Nausea And Vomiting     Current Outpatient Medications:  .  amphetamine-dextroamphetamine (ADDERALL) 20 MG tablet, Take 1 tablet (20 mg total) by mouth 3 (three) times daily., Disp: 90 tablet, Rfl: 0 .  amphetamine-dextroamphetamine (ADDERALL) 20 MG tablet, Take 20 mg by mouth 3 (three) times daily., Disp: , Rfl:  .  amphetamine-dextroamphetamine (ADDERALL) 20 MG tablet, Take 1 tablet (20 mg total) by mouth 3 (three) times daily., Disp: 90 tablet, Rfl: 0 .  amphetamine-dextroamphetamine (ADDERALL) 20 MG tablet, Take 1 tablet (20 mg total) by mouth 3 (three) times daily., Disp: 90 tablet, Rfl: 0 .  cyclobenzaprine (FLEXERIL) 5 MG tablet, Take 1 tablet (5  mg total) by mouth 3 (three) times daily as needed for muscle spasms., Disp: 30 tablet, Rfl: 1  Review of Systems  Constitutional: Negative.   Respiratory: Negative.   Neurological: Negative.   Psychiatric/Behavioral: Negative.     Social History   Tobacco Use  . Smoking status: Former Smoker    Packs/day: 0.30    Years: 0.00    Pack years: 0.00    Types: Cigarettes  . Smokeless tobacco: Never Used  . Tobacco comment: started smoking at age 49  Substance Use Topics  . Alcohol use: No      Objective:   There were no vitals taken for this visit. There were no vitals filed for this visit.There is no height or weight on file to calculate BMI.   Physical Exam Constitutional:      General: He is not in acute distress.    Appearance: Normal appearance.  Pulmonary:     Effort: Pulmonary effort is normal. No respiratory distress.  Neurological:     Mental Status: He is alert and oriented to person, place, and time. Mental status is at baseline.  Psychiatric:        Mood and Affect: Mood normal.        Behavior: Behavior normal.      No results found for any visits on 06/20/19.     Assessment & Plan    I discussed the assessment and treatment plan with the patient. The patient was provided an opportunity to ask questions and all were answered. The  patient agreed with the plan and demonstrated an understanding of the instructions.   The patient was advised to call back or seek an in-person evaluation if the symptoms worsen or if the condition fails to improve as anticipated.  Problem List Items Addressed This Visit      Other   Adult ADHD - Primary    Well controlled, no side effects Continue adderall at current dose Refilled Adderral x3 F/u in 4 months      Chronic lumbar pain    Chronic Continue naproxen and flexeril prn      Relevant Medications   cyclobenzaprine (FLEXERIL) 5 MG tablet   Chronic midline thoracic back pain    Chronic Continue naproxen and  flexeril prn      Relevant Medications   cyclobenzaprine (FLEXERIL) 5 MG tablet       Return in about 4 months (around 10/20/2019) for CPE.   The entirety of the information documented in the History of Present Illness, Review of Systems and Physical Exam were personally obtained by me. Portions of this information were initially documented by [facility], CMA and reviewed by me for thoroughness and accuracy.    Bartlett, Joshua Bucy, MD MPH [facility]

## 2019-06-29 ENCOUNTER — Telehealth: Payer: Self-pay | Admitting: Family Medicine

## 2019-06-29 MED ORDER — AMPHETAMINE-DEXTROAMPHETAMINE 20 MG PO TABS: 20.0000 mg | ORAL_TABLET | Freq: Three times a day (TID) | ORAL | 0 refills | Status: DC

## 2019-06-29 NOTE — Telephone Encounter (Signed)
Pt needing to change his medication refill of amphetamine-dextroamphetamine (ADDERALL) 20 MG tablet over to: [facility] [name], [place] AT [place] [phone] (Phone) [phone] (Fax)   [facility] [address] [facility]   Needing it filled today if possible.  Thanks, [employer]

## 2019-06-29 NOTE — Telephone Encounter (Signed)
Left patient a message advising him that RX has been sent to [facility]

## 2019-06-29 NOTE — Telephone Encounter (Signed)
rx sent

## 2019-08-14 ENCOUNTER — Other Ambulatory Visit: Payer: Self-pay | Admitting: Family Medicine

## 2019-08-14 NOTE — Telephone Encounter (Signed)
Refill request for Adderall and flexeril

## 2019-08-14 NOTE — Telephone Encounter (Signed)
Medication Refill - Medication: amphetamine-dextroamphetamine (ADDERALL) 20 MG tablet/ cyclobenzaprine (FLEXERIL) 5 MG tablet  Has the patient contacted their pharmacy? Yes.   (Agent: If no, request that the patient contact the pharmacy for the refill.) (Agent: If yes, when and what did the pharmacy advise?)  Preferred Pharmacy (with phone number or street name):  [facility] [name], [place] [phone] (Phone) [phone] (Fax)     Agent: Please be advised that RX refills may take up to 3 business days. We ask that you follow-up with your pharmacy.

## 2019-08-15 MED ORDER — AMPHETAMINE-DEXTROAMPHETAMINE 20 MG PO TABS: 20.0000 mg | ORAL_TABLET | Freq: Three times a day (TID) | ORAL | 0 refills | Status: DC

## 2019-08-15 MED ORDER — CYCLOBENZAPRINE HCL 5 MG PO TABS: 5.0000 mg | ORAL_TABLET | Freq: Three times a day (TID) | ORAL | 1 refills | Status: DC | PRN

## 2019-09-27 ENCOUNTER — Other Ambulatory Visit: Payer: Self-pay | Admitting: Family Medicine

## 2019-09-27 NOTE — Telephone Encounter (Signed)
Requested medication (s) are due for refill today: yes  Requested medication (s) are on the active medication list: yes  Last refill: 06/20/2019  Future visit scheduled: no  Notes to clinic:  This refill cannot be delegated    Requested Prescriptions  Pending Prescriptions Disp Refills   amphetamine-dextroamphetamine (ADDERALL) 20 MG tablet 90 tablet 0    Sig: Take 1 tablet (20 mg total) by mouth 3 (three) times daily.      Not Delegated - Psychiatry:  Stimulants/ADHD Failed - 09/27/2019 11:34 AM      Failed - This refill cannot be delegated      Failed - Urine Drug Screen completed in last 360 days.      Failed - Valid encounter within last 3 months    Recent Outpatient Visits           3 months ago Adult ADHD   [place], Dionne Bucy, MD   8 months ago Tinea corporis   [facility], Dionne Bucy, MD   9 months ago Adult ADHD   [facility], Dionne Bucy, MD   10 months ago Injury of finger of left hand, initial encounter   [facility], Dionne Bucy, MD   11 months ago Back muscle spasm   [facility] [place], Dionne Bucy, MD                cyclobenzaprine (FLEXERIL) 5 MG tablet 30 tablet 1    Sig: Take 1 tablet (5 mg total) by mouth 3 (three) times daily as needed for muscle spasms.      Not Delegated - Analgesics:  Muscle Relaxants Failed - 09/27/2019 11:34 AM      Failed - This refill cannot be delegated      Passed - Valid encounter within last 6 months    Recent Outpatient Visits           3 months ago Adult ADHD   [facility], Dionne Bucy, MD   8 months ago [place] [facility], Dionne Bucy, MD   9 months ago Adult ADHD   [place], Dionne Bucy, MD   10 months ago Injury of finger of left hand, initial encounter   [facility], Dionne Bucy, MD   11  months ago Back muscle spasm   [facility] [place], Dionne Bucy, MD

## 2019-09-27 NOTE — Telephone Encounter (Signed)
Medication refill: amphetamine-dextroamphetamine (ADDERALL) 20 MG tablet [id number]  cyclobenzaprine (FLEXERIL) 5 MG tablet [id number]     Pharmacy:  [facility] [place], [place] AT [place] Phone:  [phone]  Fax:  [phone]     Pt aware of turn around time

## 2019-10-01 MED ORDER — CYCLOBENZAPRINE HCL 5 MG PO TABS: 5.0000 mg | ORAL_TABLET | Freq: Three times a day (TID) | ORAL | 1 refills | Status: DC | PRN

## 2019-10-01 MED ORDER — AMPHETAMINE-DEXTROAMPHETAMINE 20 MG PO TABS: 20.0000 mg | ORAL_TABLET | Freq: Three times a day (TID) | ORAL | 0 refills | Status: DC

## 2019-10-01 NOTE — Telephone Encounter (Signed)
Due for f/u at the end of this month for ADHD

## 2019-10-17 ENCOUNTER — Ambulatory Visit: Payer: Self-pay | Admitting: Family Medicine

## 2019-10-18 ENCOUNTER — Ambulatory Visit: Payer: BC Managed Care – PPO | Admitting: Family Medicine

## 2019-10-18 ENCOUNTER — Ambulatory Visit
Admission: RE | Admit: 2019-10-18 | Discharge: 2019-10-18 | Disposition: A | Discharge status: Home / Self Care | Payer: BC Managed Care – PPO | Source: Ambulatory Visit | Attending: Family Medicine | Admitting: Family Medicine

## 2019-10-18 ENCOUNTER — Telehealth: Payer: Self-pay

## 2019-10-18 ENCOUNTER — Encounter: Payer: Self-pay | Admitting: Family Medicine

## 2019-10-18 ENCOUNTER — Other Ambulatory Visit: Payer: Self-pay

## 2019-10-18 VITALS — BP 131/80 | HR 83 | Temp 95.9°F | Wt 251.0 lb

## 2019-10-18 DIAGNOSIS — L910 Hypertrophic scar: Secondary | ICD-10-CM

## 2019-10-18 DIAGNOSIS — S6992XA Unspecified injury of left wrist, hand and finger(s), initial encounter: Secondary | ICD-10-CM | POA: Diagnosis not present

## 2019-10-18 DIAGNOSIS — R2232 Localized swelling, mass and lump, left upper limb: Secondary | ICD-10-CM | POA: Diagnosis not present

## 2019-10-18 DIAGNOSIS — F909 Attention-deficit hyperactivity disorder, unspecified type: Secondary | ICD-10-CM

## 2019-10-18 MED ORDER — AMPHETAMINE-DEXTROAMPHETAMINE 20 MG PO TABS: 20.0000 mg | ORAL_TABLET | Freq: Three times a day (TID) | ORAL | 0 refills | Status: DC

## 2019-10-18 NOTE — Progress Notes (Signed)
Patient: Joshua Bartlett Male    DOB: [birth year]   39 y.o.   MRN: [id number] Visit Date: 10/18/2019  Today's Provider: Lavon Paganini, MD   Chief Complaint  Patient presents with  . ADHD  . Finger Injury   Subjective:     HPI   Patient presents today for 4 month Adult ADHD follow up, patient was told to continue taking Adderrall at the same dose. Patient is in good compliance with medication and the medication is working good for him.   Patient has finger injury on left hand that happened 4 months ago. Pt noticed knot between fingers where it is swollen also has some redness. He is not sure if the dog's tooth caught between 2nd and 3rd finger of L hand.  He is right hand dominant.     Allergies  Allergen Reactions  . Hydrocodone   . Vicodin [Hydrocodone-Acetaminophen] Nausea And Vomiting     Current Outpatient Medications:  .  amphetamine-dextroamphetamine (ADDERALL) 20 MG tablet, Take 1 tablet (20 mg total) by mouth 3 (three) times daily., Disp: 90 tablet, Rfl: 0 .  cyclobenzaprine (FLEXERIL) 5 MG tablet, Take 1 tablet (5 mg total) by mouth 3 (three) times daily as needed for muscle spasms., Disp: 30 tablet, Rfl: 1 .  amphetamine-dextroamphetamine (ADDERALL) 20 MG tablet, Take 1 tablet (20 mg total) by mouth 3 (three) times daily. (Patient not taking: Reported on 10/18/2019), Disp: 90 tablet, Rfl: 0 .  amphetamine-dextroamphetamine (ADDERALL) 20 MG tablet, Take 1 tablet (20 mg total) by mouth 3 (three) times daily. (Patient not taking: Reported on 10/18/2019), Disp: 90 tablet, Rfl: 0  Review of Systems  Constitutional: Negative.   Respiratory: Negative.   Cardiovascular: Negative.   Genitourinary: Negative.   Musculoskeletal: Negative for joint swelling.  Skin: Negative.   Neurological: Negative.   Psychiatric/Behavioral: Negative.     Social History   Tobacco Use  . Smoking status: Former Smoker    Packs/day: 0.30    Years: 0.00    Pack years: 0.00   Types: Cigarettes  . Smokeless tobacco: Never Used  . Tobacco comment: started smoking at age 11  Substance Use Topics  . Alcohol use: No      Objective:   BP 131/80 (BP Location: Right Arm, Patient Position: Sitting, Cuff Size: Large)   Pulse 83   Temp (!) 95.9 F (35.5 C) (Temporal)   Wt 251 lb (113.9 kg)   BMI 32.23 kg/m  Vitals:   10/18/19 0830  BP: 131/80  Pulse: 83  Temp: (!) 95.9 F (35.5 C)  TempSrc: Temporal  Weight: 251 lb (113.9 kg)  Body mass index is 32.23 kg/m.   Physical Exam Constitutional:      Appearance: Normal appearance.  HENT:     Head: Normocephalic and atraumatic.  Eyes:     General: No scleral icterus.    Conjunctiva/sclera: Conjunctivae normal.  Cardiovascular:     Rate and Rhythm: Normal rate and regular rhythm.     Pulses: Normal pulses.     Heart sounds: Normal heart sounds. No murmur.  Pulmonary:     Effort: Pulmonary effort is normal. No respiratory distress.     Breath sounds: Normal breath sounds. No wheezing or rhonchi.  Musculoskeletal:     Cervical back: Neck supple.     Right lower leg: No edema.     Left lower leg: No edema.  Lymphadenopathy:     Cervical: No cervical adenopathy.  Skin:  General: Skin is warm and dry.     Capillary Refill: Capillary refill takes less than 2 seconds.     Findings: No erythema or rash.     Comments: Nodule of scar tissue between L 2nd and 3rd digits  Neurological:     Mental Status: He is alert and oriented to person, place, and time. Mental status is at baseline.  Psychiatric:        Mood and Affect: Mood normal.        Behavior: Behavior normal.      No results found for any visits on 10/18/19.     Assessment & Plan    1. Scar, hypertrophic 2. Nodule of finger of left hand - new problem - occurred after hand injury ~4 months ago - reassurance given that there are no signs of infection - nodule that is palpable is likely some hypertrophic scar tissue  - will get XRay to  ensure no foreign body, given mechanism of previous injury - can use ice, massage to work on breaking it up over time - no functional issues for use of hand or fingers - DG Hand Complete Left; Future  3. Adult ADHD Well-controlled, no side effects Continue Adderall at current dose Refilled Adderall x3 Follow-up in 3 months at CPE    Meds ordered this encounter  Medications  . amphetamine-dextroamphetamine (ADDERALL) 20 MG tablet    Sig: Take 1 tablet (20 mg total) by mouth 3 (three) times daily.    Dispense:  90 tablet    Refill:  0    Do not fill <30 days from last refill  . amphetamine-dextroamphetamine (ADDERALL) 20 MG tablet    Sig: Take 1 tablet (20 mg total) by mouth 3 (three) times daily.    Dispense:  90 tablet    Refill:  0    Do not fill <30 days from last refill  . amphetamine-dextroamphetamine (ADDERALL) 20 MG tablet    Sig: Take 1 tablet (20 mg total) by mouth 3 (three) times daily.    Dispense:  90 tablet    Refill:  0    Do not fill <30 days from last refill     Return in about 3 months (around 01/16/2020) for CPE.   The entirety of the information documented in the History of Present Illness, Review of Systems and Physical Exam were personally obtained by me. Portions of this information were initially documented by Johny Shock, CMA and reviewed by me for thoroughness and accuracy.    Ellenie Salome, Dionne Bucy, MD MPH [facility]

## 2019-10-18 NOTE — Telephone Encounter (Signed)
Patient advised as below.  

## 2019-10-18 NOTE — Assessment & Plan Note (Signed)
Well-controlled, no side effects Continue Adderall at current dose Refilled Adderall x3 Follow-up in 3 months at CPE

## 2019-10-18 NOTE — Telephone Encounter (Signed)
-----   Message from Virginia Crews, MD sent at 10/18/2019  1:17 PM EST ----- Normal x-ray with no fracture or dislocation and no foreign body found.  Seems that the nodule is just scar tissue.  Recommend massage and ice as needed

## 2019-11-19 ENCOUNTER — Other Ambulatory Visit: Payer: Self-pay | Admitting: Family Medicine

## 2019-11-19 NOTE — Telephone Encounter (Signed)
Medication Refill - Medication: amphetamine-dextroamphetamine (ADDERALL) 20 MG tablet/cyclobenzaprine (FLEXERIL) 5 MG tablet  Has the patient contacted their pharmacy? No. (Agent: If no, request that the patient contact the pharmacy for the refill.) (Agent: If yes, when and what did the pharmacy advise?)  Preferred Pharmacy (with phone number or street name):  [facility] [name], [place] Phone:  [phone]  Fax:  [phone]       Agent: Please be advised that RX refills may take up to 3 business days. We ask that you follow-up with your pharmacy.

## 2019-11-20 NOTE — Telephone Encounter (Signed)
3 refills of Adderall were sent last month and he has only filled one.  Can we check with the pharmacy, please

## 2019-11-27 DIAGNOSIS — Z20828 Contact with and (suspected) exposure to other viral communicable diseases: Secondary | ICD-10-CM | POA: Diagnosis not present

## 2019-11-27 NOTE — Telephone Encounter (Signed)
[facility] does have prescriptions on file. They will go ahead and refill.

## 2019-11-28 MED ORDER — CYCLOBENZAPRINE HCL 5 MG PO TABS: 5.0000 mg | ORAL_TABLET | Freq: Three times a day (TID) | ORAL | 1 refills | Status: DC | PRN

## 2020-01-14 NOTE — Progress Notes (Signed)
No show

## 2020-01-18 ENCOUNTER — Ambulatory Visit (INDEPENDENT_AMBULATORY_CARE_PROVIDER_SITE_OTHER): Payer: BC Managed Care – PPO | Admitting: Family Medicine

## 2020-01-18 DIAGNOSIS — F17200 Nicotine dependence, unspecified, uncomplicated: Secondary | ICD-10-CM

## 2020-01-18 DIAGNOSIS — F909 Attention-deficit hyperactivity disorder, unspecified type: Secondary | ICD-10-CM

## 2020-01-18 DIAGNOSIS — Z0000 Encounter for general adult medical examination without abnormal findings: Secondary | ICD-10-CM

## 2020-01-18 DIAGNOSIS — Z5329 Procedure and treatment not carried out because of patient's decision for other reasons: Secondary | ICD-10-CM

## 2020-01-21 ENCOUNTER — Other Ambulatory Visit: Payer: Self-pay | Admitting: Family Medicine

## 2020-01-21 MED ORDER — AMPHETAMINE-DEXTROAMPHETAMINE 20 MG PO TABS: 20.0000 mg | ORAL_TABLET | Freq: Three times a day (TID) | ORAL | 0 refills | Status: DC

## 2020-01-21 MED ORDER — CYCLOBENZAPRINE HCL 5 MG PO TABS: 5.0000 mg | ORAL_TABLET | Freq: Three times a day (TID) | ORAL | 1 refills | Status: DC | PRN

## 2020-01-21 NOTE — Telephone Encounter (Signed)
Medication Refill - Medication: flexeril and adderall   Has the patient contacted their pharmacy? Yes.   (Agent: If no, request that the patient contact the pharmacy for the refill.) (Agent: If yes, when and what did the pharmacy advise?)  Preferred Pharmacy (with phone number or street name):  [facility] [name], [place]  [place] [address]  Phone: [phone] Fax: [phone]  Not a 24 hour pharmacy; exact hours not known.     Agent: Please be advised that RX refills may take up to 3 business days. We ask that you follow-up with your pharmacy.

## 2020-01-21 NOTE — Telephone Encounter (Signed)
Requested medication (s) are due for refill today: yes  Requested medication (s) are on the active medication list: yes  Last refill:  Cyclobenzaprine: 11/28/19    Adderall: 10/18/19  Future visit scheduled: yes  Notes to clinic:  both medications not delegated to NT to refill   Requested Prescriptions  Pending Prescriptions Disp Refills   cyclobenzaprine (FLEXERIL) 5 MG tablet 30 tablet 1    Sig: Take 1 tablet (5 mg total) by mouth 3 (three) times daily as needed for muscle spasms.      Not Delegated - Analgesics:  Muscle Relaxants Failed - 01/21/2020 11:54 AM      Failed - This refill cannot be delegated      Passed - Valid encounter within last 6 months    Recent Outpatient Visits           3 days ago No-show for appointment   [facility], Dionne Bucy, MD   3 months ago Scar, hypertrophic   [facility], Dionne Bucy, MD   7 months ago Adult ADHD   [facility], Dionne Bucy, MD   12 months ago [place] [facility], Dionne Bucy, MD   1 year ago Adult ADHD   [facility] [place], Dionne Bucy, MD                amphetamine-dextroamphetamine (ADDERALL) 20 MG tablet 90 tablet 0    Sig: Take 1 tablet (20 mg total) by mouth 3 (three) times daily.      Not Delegated - Psychiatry:  Stimulants/ADHD Failed - 01/21/2020 11:54 AM      Failed - This refill cannot be delegated      Failed - Urine Drug Screen completed in last 360 days.      Failed - Valid encounter within last 3 months    Recent Outpatient Visits           3 days ago No-show for appointment   [facility], Dionne Bucy, MD   3 months ago Scar, hypertrophic   [facility] [place], Dionne Bucy, MD   7 months ago Adult ADHD   [facility], Dionne Bucy, MD   12 months ago [place] [facility], Dionne Bucy, MD   1  year ago Adult ADHD   [facility] [place], Dionne Bucy, MD
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
## 2020-02-01 NOTE — Patient Instructions (Signed)
Preventive Care 19-37 Years Old, Male Preventive care refers to lifestyle choices and visits with your health care provider that can promote health and wellness. This includes:  A yearly physical exam. This is also called an annual well check.  Regular dental and eye exams.  Immunizations.  Screening for certain conditions.  Healthy lifestyle choices, such as eating a healthy diet, getting regular exercise, not using drugs or products that contain nicotine and tobacco, and limiting alcohol use. What can I expect for my preventive care visit? Physical exam Your health care provider will check:  Height and weight. These may be used to calculate body mass index (BMI), which is a measurement that tells if you are at a healthy weight.  Heart rate and blood pressure.  Your skin for abnormal spots. Counseling Your health care provider may ask you questions about:  Alcohol, tobacco, and drug use.  Emotional well-being.  Home and relationship well-being.  Sexual activity.  Eating habits.  Work and work Statistician. What immunizations do I need?  Influenza (flu) vaccine  This is recommended every year. Tetanus, diphtheria, and pertussis (Tdap) vaccine  You may need a Td booster every 10 years. Varicella (chickenpox) vaccine  You may need this vaccine if you have not already been vaccinated. Human papillomavirus (HPV) vaccine  If recommended by your health care provider, you may need three doses over 6 months. Measles, mumps, and rubella (MMR) vaccine  You may need at least one dose of MMR. You may also need a second dose. Meningococcal conjugate (MenACWY) vaccine  One dose is recommended if you are 45-76 years old and a Market researcher living in a residence hall, or if you have one of several medical conditions. You may also need additional booster doses. Pneumococcal conjugate (PCV13) vaccine  You may need this if you have certain conditions and were not  previously vaccinated. Pneumococcal polysaccharide (PPSV23) vaccine  You may need one or two doses if you smoke cigarettes or if you have certain conditions. Hepatitis A vaccine  You may need this if you have certain conditions or if you travel or work in places where you may be exposed to hepatitis A. Hepatitis B vaccine  You may need this if you have certain conditions or if you travel or work in places where you may be exposed to hepatitis B. Haemophilus influenzae type b (Hib) vaccine  You may need this if you have certain risk factors. You may receive vaccines as individual doses or as more than one vaccine together in one shot (combination vaccines). Talk with your health care provider about the risks and benefits of combination vaccines. What tests do I need? Blood tests  Lipid and cholesterol levels. These may be checked every 5 years starting at age 17.  Hepatitis C test.  Hepatitis B test. Screening   Diabetes screening. This is done by checking your blood sugar (glucose) after you have not eaten for a while (fasting).  Sexually transmitted disease (STD) testing. Talk with your health care provider about your test results, treatment options, and if necessary, the need for more tests. Follow these instructions at home: Eating and drinking   Eat a diet that includes fresh fruits and vegetables, whole grains, lean protein, and low-fat dairy products.  Take vitamin and mineral supplements as recommended by your health care provider.  Do not drink alcohol if your health care provider tells you not to drink.  If you drink alcohol: ? Limit how much you have to 0-2  drinks a day. ? Be aware of how much alcohol is in your drink. In the [place] one drink equals one 12 oz bottle of beer (355 mL), one 5 oz glass of wine (148 mL), or one 1 oz glass of hard liquor (44 mL). Lifestyle  Take daily care of your teeth and gums.  Stay active. Exercise for at least 30 minutes on 5 or  more days each week.  Do not use any products that contain nicotine or tobacco, such as cigarettes, e-cigarettes, and chewing tobacco. If you need help quitting, ask your health care provider.  If you are sexually active, practice safe sex. Use a condom or other form of protection to prevent STIs (sexually transmitted infections). What's next?  Go to your health care provider once a year for a well check visit.  Ask your health care provider how often you should have your eyes and teeth checked.  Stay up to date on all vaccines. This information is not intended to replace advice given to you by your health care provider. Make sure you discuss any questions you have with your health care provider. Document Revised: 09/07/2018 Document Reviewed: 09/07/2018 Elsevier Patient Education  2020 [employer].

## 2020-02-04 ENCOUNTER — Other Ambulatory Visit: Payer: Self-pay

## 2020-02-04 ENCOUNTER — Ambulatory Visit (INDEPENDENT_AMBULATORY_CARE_PROVIDER_SITE_OTHER): Payer: BC Managed Care – PPO | Admitting: Family Medicine

## 2020-02-04 ENCOUNTER — Encounter: Payer: Self-pay | Admitting: Family Medicine

## 2020-02-04 VITALS — BP 124/84 | HR 64 | Temp 97.3°F | Resp 16 | Ht 74.0 in | Wt 253.0 lb

## 2020-02-04 DIAGNOSIS — Z0000 Encounter for general adult medical examination without abnormal findings: Secondary | ICD-10-CM

## 2020-02-04 DIAGNOSIS — R748 Abnormal levels of other serum enzymes: Secondary | ICD-10-CM | POA: Diagnosis not present

## 2020-02-04 DIAGNOSIS — T148XXA Other injury of unspecified body region, initial encounter: Secondary | ICD-10-CM | POA: Diagnosis not present

## 2020-02-04 DIAGNOSIS — F909 Attention-deficit hyperactivity disorder, unspecified type: Secondary | ICD-10-CM

## 2020-02-04 NOTE — Assessment & Plan Note (Addendum)
Well controlled, no side effects on medications Patient doing well at home and work on medication Patient would like to cut back on medication Decrease Adderall to one tablet two times daily Patient concerned about possible ED from medication Refilled Adderall  Follow up in 4 months

## 2020-02-05 MED ORDER — AMPHETAMINE-DEXTROAMPHETAMINE 20 MG PO TABS: 20.0000 mg | ORAL_TABLET | Freq: Two times a day (BID) | ORAL | 0 refills | Status: DC

## 2020-02-20 ENCOUNTER — Other Ambulatory Visit: Payer: Self-pay

## 2020-02-20 ENCOUNTER — Ambulatory Visit: Payer: BC Managed Care – PPO | Admitting: Dermatology

## 2020-02-20 DIAGNOSIS — L309 Dermatitis, unspecified: Secondary | ICD-10-CM | POA: Diagnosis not present

## 2020-02-20 DIAGNOSIS — L905 Scar conditions and fibrosis of skin: Secondary | ICD-10-CM | POA: Diagnosis not present

## 2020-02-20 MED ORDER — CLOBETASOL PROP EMOLLIENT BASE 0.05 % EX CREA: TOPICAL_CREAM | CUTANEOUS | 0 refills | Status: DC
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
## 2020-02-20 NOTE — Patient Instructions (Signed)
Topical steroids (such as triamcinolone, fluocinolone, fluocinonide, mometasone, clobetasol, halobetasol, betamethasone, hydrocortisone) can cause thinning and lightening of the skin if they are used for too long in the same area. Your physician has selected the right strength medicine for your problem and area affected on the body. Please use your medication only as directed by your physician to prevent side effects.   . 

## 2020-03-10 DIAGNOSIS — Z20822 Contact with and (suspected) exposure to covid-19: Secondary | ICD-10-CM | POA: Diagnosis not present

## 2020-03-10 DIAGNOSIS — R509 Fever, unspecified: Secondary | ICD-10-CM | POA: Diagnosis not present

## 2020-03-10 DIAGNOSIS — R0981 Nasal congestion: Secondary | ICD-10-CM | POA: Diagnosis not present

## 2020-03-11 ENCOUNTER — Ambulatory Visit: Payer: BC Managed Care – PPO | Admitting: Dermatology

## 2020-03-11 DIAGNOSIS — Z20822 Contact with and (suspected) exposure to covid-19: Secondary | ICD-10-CM | POA: Diagnosis not present

## 2020-04-06 IMAGING — CR DG HAND COMPLETE 3+V*L*
1 series · 3 of 3 positions shown · non-contrast
Comparison: 11/28/2018

CLINICAL DATA: Previous hand injury between the second and third
digits. Evaluate for foreign body.

EXAM:
LEFT HAND - COMPLETE 3+ VIEW

[Series 1: dg hand complete left · 0.14mm/px · 3 of 3 slices shown]
[im 1/3]
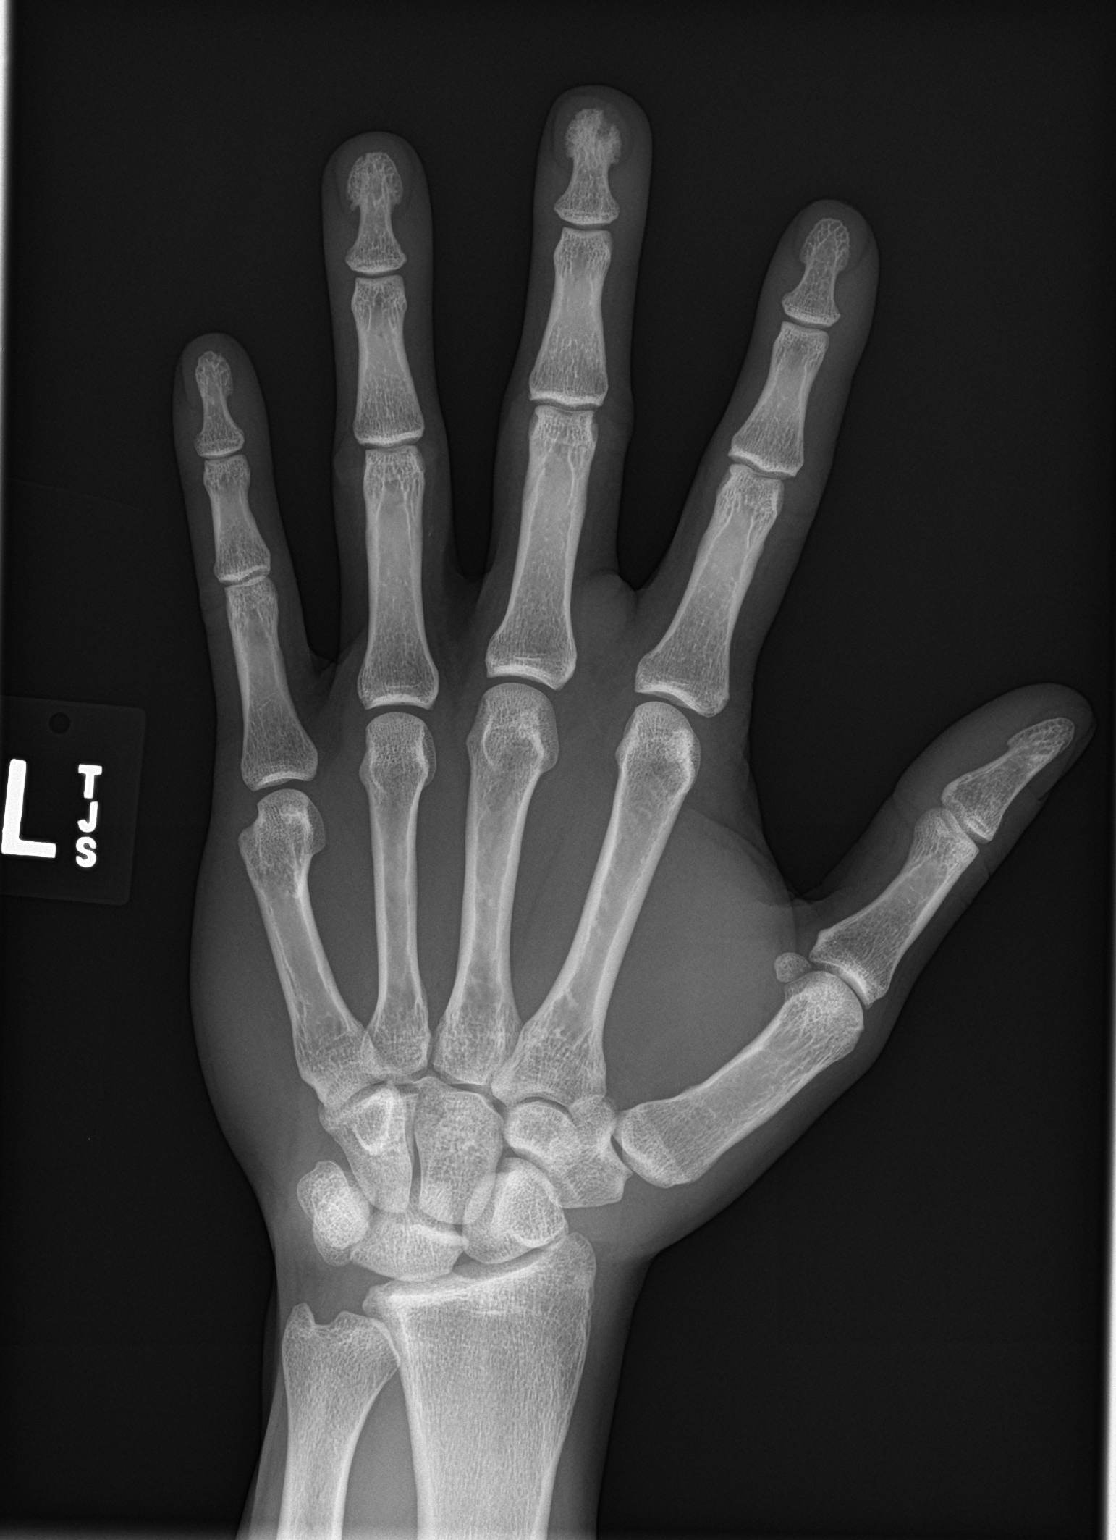
[im 2/3]
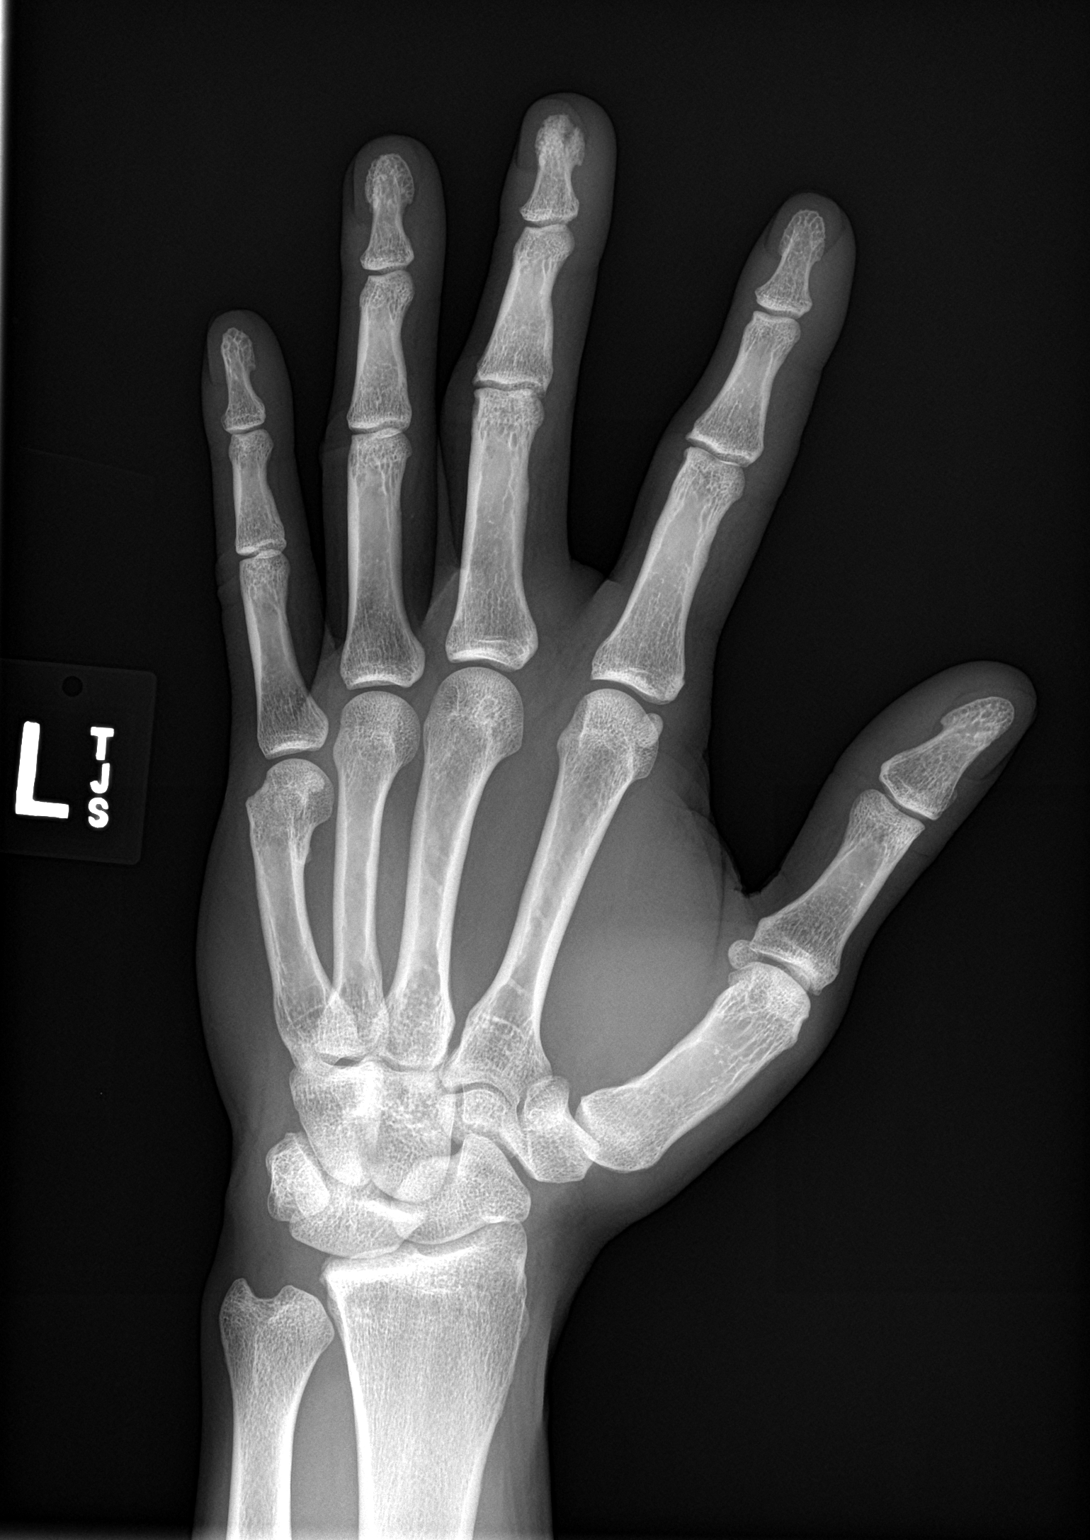
[im 3/3]
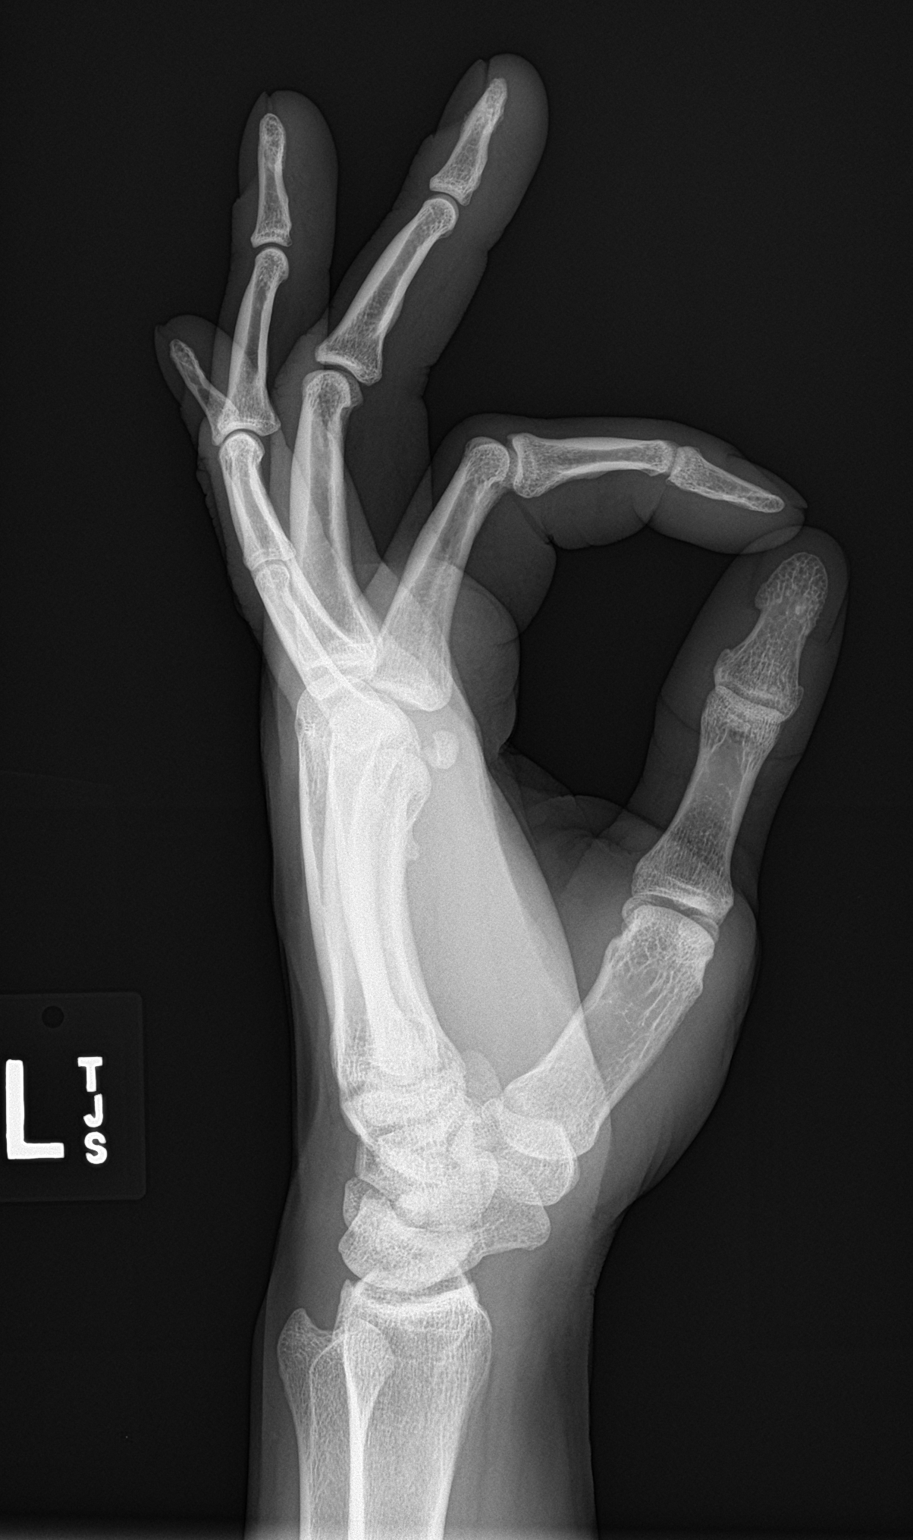

[3 of 3 positions shown; findings below may reference images not displayed]

FINDINGS: No acute fracture or dislocation. Posttraumatic deformity of the
distal tuft of the third digit. No significant arthropathy. No focal
soft tissue swelling, radiopaque foreign body, or soft tissue gas.
IMPRESSION: 1. No acute fracture or dislocation.
2. No radiopaque foreign body.

## 2020-04-14 ENCOUNTER — Other Ambulatory Visit: Payer: Self-pay

## 2020-04-14 ENCOUNTER — Ambulatory Visit: Payer: BC Managed Care – PPO | Admitting: Dermatology

## 2020-04-14 DIAGNOSIS — L237 Allergic contact dermatitis due to plants, except food: Secondary | ICD-10-CM | POA: Diagnosis not present

## 2020-04-14 DIAGNOSIS — D485 Neoplasm of uncertain behavior of skin: Secondary | ICD-10-CM | POA: Diagnosis not present

## 2020-04-14 DIAGNOSIS — L080 Pyoderma: Secondary | ICD-10-CM | POA: Diagnosis not present

## 2020-04-14 NOTE — Progress Notes (Signed)
   Follow-Up Visit   Subjective  Joshua Bartlett is a 38 y.o. male who presents for the following: follow-up scar (Hypertrophic vs Prurigo Nodule on left 2nd webspace. He is using clobetasol cream qod. When he was using twice a day, the area would swell up almost like a cyst and become painful. ). The contact dermatitis has cleared up after using the clobetasol cream a few times.   The following portions of the chart were reviewed this encounter and updated as appropriate:      Review of Systems:  No other skin or systemic complaints except as noted in HPI or Assessment and Plan.  Objective  Well appearing patient in no apparent distress; mood and affect are within normal limits.  A focused examination was performed including hands. Relevant physical exam findings are noted in the Assessment and Plan.  Objective  Left Hand 2nd Webspace: 7.0 x 4.39mm firm nodule     Objective  BL forearms: Clear today   Assessment & Plan  Neoplasm of uncertain behavior of skin Left Hand 2nd Webspace  Skin / nail biopsy Type of biopsy: tangential   Informed consent: discussed and consent obtained   Patient was prepped and draped in usual sterile fashion: Area prepped with alcohol. Anesthesia: the lesion was anesthetized in a standard fashion   Anesthetic:  1% lidocaine w/ epinephrine 1-100,000 buffered w/ 8.4% NaHCO3 Instrument used: flexible razor blade   Hemostasis achieved with: pressure, aluminum chloride and electrodesiccation   Outcome: patient tolerated procedure well   Post-procedure details: wound care instructions given   Post-procedure details comment:  Ointment and small bandage applied  Specimen 1 - Surgical pathology Differential Diagnosis: Prurigo Nodule vs Cyst vs other Check Margins: No 7.0 x 4.43mm firm nodule  Minimal change with topical clobetasol  Allergic contact dermatitis due to plants, except food BL forearms  Resolved with topical clobetasol  cream.  Return if symptoms worsen or fail to improve.   IJamesetta Orleans, CMA, am acting as scribe for Brendolyn Patty, MD .  Documentation: I have reviewed the above documentation for accuracy and completeness, and I agree with the above.  Brendolyn Patty MD

## 2020-04-14 NOTE — Patient Instructions (Signed)

## 2020-04-21 ENCOUNTER — Telehealth: Payer: Self-pay

## 2020-04-21 NOTE — Telephone Encounter (Signed)
-----   Message from Brendolyn Patty, MD sent at 04/21/2020 10:37 AM EDT ----- Skin , left hand 2nd webspace SUPPURATIVE GRANULOMATOUS INFLAMMATION  Benign, consistent with ruptured inflamed cyst

## 2020-04-21 NOTE — Telephone Encounter (Signed)
Advised pt of bx results/sh ?

## 2020-05-26 ENCOUNTER — Other Ambulatory Visit: Payer: Self-pay | Admitting: Family Medicine

## 2020-05-26 MED ORDER — AMPHETAMINE-DEXTROAMPHETAMINE 20 MG PO TABS: 20.0000 mg | ORAL_TABLET | Freq: Two times a day (BID) | ORAL | 0 refills | Status: DC

## 2020-05-26 NOTE — Telephone Encounter (Signed)
Copied from [place] [id number]. Topic: Quick Communication - Rx Refill/Question >> May 26, 2020 12:57 PM Mcneil, Ja-Kwan wrote: Medication: amphetamine-dextroamphetamine (ADDERALL) 20 MG tablet  Has the patient contacted their pharmacy? no  Preferred Pharmacy (with phone number or street name): [id number] [name], [place] Phone: [phone]  Fax: [phone]  Agent: Please be advised that RX refills may take up to 3 business days. We ask that you follow-up with your pharmacy.

## 2020-05-26 NOTE — Telephone Encounter (Signed)
Requested medication (s) are due for refill today: no  Requested medication (s) are on the active medication list: yes   Last refill:  02/05/2020  Future visit scheduled: yes   Notes to clinic: this refill cannot be delegated    Requested Prescriptions  Pending Prescriptions Disp Refills   amphetamine-dextroamphetamine (ADDERALL) 20 MG tablet 60 tablet 0    Sig: Take 1 tablet (20 mg total) by mouth 2 (two) times daily.      Not Delegated - Psychiatry:  Stimulants/ADHD Failed - 05/26/2020  1:04 PM      Failed - This refill cannot be delegated      Failed - Urine Drug Screen completed in last 360 days.      Failed - Valid encounter within last 3 months    Recent Outpatient Visits           3 months ago Annual physical exam   [facility] [place], Dionne Bucy, MD   4 months ago No-show for appointment   [facility], Dionne Bucy, MD   7 months ago Scar, hypertrophic   [facility], Dionne Bucy, MD   11 months ago Adult ADHD   [facility], Dionne Bucy, MD   1 year ago Tinea corporis   [facility], Dionne Bucy, MD       Future Appointments             In 1 week [facility], Dionne Bucy, MD [facility] [place]

## 2020-05-28 ENCOUNTER — Other Ambulatory Visit: Payer: Self-pay | Admitting: Sleep Medicine

## 2020-05-28 ENCOUNTER — Other Ambulatory Visit: Payer: BC Managed Care – PPO

## 2020-05-28 DIAGNOSIS — I471 Supraventricular tachycardia: Secondary | ICD-10-CM

## 2020-05-29 ENCOUNTER — Encounter: Payer: Self-pay | Admitting: Family Medicine

## 2020-05-29 LAB — NOVEL CORONAVIRUS, NAA: SARS-CoV-2, NAA: NOT DETECTED

## 2020-06-06 ENCOUNTER — Ambulatory Visit: Payer: BC Managed Care – PPO | Admitting: Family Medicine

## 2020-06-17 ENCOUNTER — Telehealth: Payer: Self-pay

## 2020-06-17 ENCOUNTER — Ambulatory Visit: Payer: BC Managed Care – PPO | Admitting: Family Medicine

## 2020-06-17 NOTE — Telephone Encounter (Signed)
FYI

## 2020-06-17 NOTE — Telephone Encounter (Signed)
Copied from [place] [id number]. Topic: Quick Communication - Appointment Cancellation >> Jun 17, 2020  2:19 PM Lennox Solders wrote: Patient called to cancel appointment scheduled for DR B today. Patient  HAS NOT: rescheduled their appointment.  Pt girlfriend called he is stuck at work and will call back   Route to department's PEC pool.

## 2020-07-01 ENCOUNTER — Other Ambulatory Visit: Payer: Self-pay | Admitting: Family Medicine

## 2020-07-01 NOTE — Telephone Encounter (Signed)
amphetamine-dextroamphetamine (ADDERALL) 20 MG tablet Medication Date: 05/26/2020 Department: [facility] Ordering/Authorizing: Virginia Crews, MD   [facility] [phone] [name], [place] - [place] Phone:  [phone]  Fax:  [phone]

## 2020-07-01 NOTE — Telephone Encounter (Signed)
Requested medication (s) are due for refill today:yes  Requested medication (s) are on the active medication list: yes  Last refill: 05/26/20  #60  0 refills  Future visit scheduled    no  Notes to clinic: not delegated Requested Prescriptions  Pending Prescriptions Disp Refills   amphetamine-dextroamphetamine (ADDERALL) 20 MG tablet 60 tablet 0    Sig: Take 1 tablet (20 mg total) by mouth 2 (two) times daily.      Not Delegated - Psychiatry:  Stimulants/ADHD Failed - 07/01/2020  5:37 PM      Failed - This refill cannot be delegated      Failed - Urine Drug Screen completed in last 360 days.      Failed - Valid encounter within last 3 months    Recent Outpatient Visits           4 months ago Annual physical exam   [facility], Dionne Bucy, MD   5 months ago No-show for appointment   [facility], Dionne Bucy, MD   8 months ago Scar, hypertrophic   [facility], Dionne Bucy, MD   1 year ago Adult ADHD   [facility], Dionne Bucy, MD   1 year ago Tinea corporis   [facility], Dionne Bucy, MD

## 2020-07-03 NOTE — Telephone Encounter (Signed)
See note

## 2020-07-03 NOTE — Telephone Encounter (Signed)
Declined Adderall as patient did not show up to his follow up. He should be scheduled an ADHD follow up with his PCP. Additionally, he has five no-shows at this clinic. Forwarding for PCPs awareness.

## 2020-07-10 DIAGNOSIS — Z23 Encounter for immunization: Secondary | ICD-10-CM | POA: Diagnosis not present

## 2021-02-18 ENCOUNTER — Telehealth: Payer: Self-pay

## 2021-02-18 NOTE — Telephone Encounter (Signed)
Copied from [place] [phone]. Topic: General - Other >> Feb 17, 2021  4:57 PM Tessa Lerner A wrote: Reason for CRM: Patient would like to be contacted regarding FMLA paperwork regarding their back   Patient's back pain is beginning to affect their job and they've been encouraged to reach out to their PCP to provide supporting documentation   Patient is in need of visit notes as well as relevant documentation to provide an explanation to their employer   Please contact to further advise when possible

## 2021-02-19 NOTE — Telephone Encounter (Signed)
Will need an office visit

## 2021-02-19 NOTE — Telephone Encounter (Signed)
Patient will fax FMLA forms. Patient advised that he may need an office visit.

## 2021-02-20 NOTE — Telephone Encounter (Signed)
Left message for patient to call for appt.

## 2021-02-20 NOTE — Telephone Encounter (Signed)
Pt is calling to schedule appt with Dr. B to complete FMLA paperwork. First available 04/03/21. Patient would like to know is there a work around Dr. Sharmaine Base schedule because his employer is wanting the paperwork ASAP. Pt is wanting to know should he go to the back dr perhaps to get a sooner appt. Please advise.

## 2021-02-26 NOTE — Telephone Encounter (Signed)
Patient advised. He will call rheumatology at [facility] for chronic back pain. Patient to call back if new referral is needed.

## 2021-02-26 NOTE — Telephone Encounter (Signed)
Either another person in our office or the orthopedist. The ortho does know him, so this may be a good option

## 2021-02-26 NOTE — Telephone Encounter (Signed)
I was going to contact patient to schedule him with another provider in office, please see message below and advise if okay or should patient go to specialist to address problem? KW

## 2021-03-27 DIAGNOSIS — M456 Ankylosing spondylitis lumbar region: Secondary | ICD-10-CM | POA: Diagnosis not present

## 2021-04-02 DIAGNOSIS — R918 Other nonspecific abnormal finding of lung field: Secondary | ICD-10-CM | POA: Diagnosis not present

## 2021-04-02 DIAGNOSIS — M2578 Osteophyte, vertebrae: Secondary | ICD-10-CM | POA: Diagnosis not present

## 2021-04-02 DIAGNOSIS — M545 Low back pain, unspecified: Secondary | ICD-10-CM | POA: Diagnosis not present

## 2021-04-02 DIAGNOSIS — M4184 Other forms of scoliosis, thoracic region: Secondary | ICD-10-CM | POA: Diagnosis not present

## 2021-04-02 DIAGNOSIS — M160 Bilateral primary osteoarthritis of hip: Secondary | ICD-10-CM | POA: Diagnosis not present

## 2021-04-02 DIAGNOSIS — M542 Cervicalgia: Secondary | ICD-10-CM | POA: Diagnosis not present

## 2021-04-02 DIAGNOSIS — G8929 Other chronic pain: Secondary | ICD-10-CM | POA: Diagnosis not present

## 2021-04-02 DIAGNOSIS — M533 Sacrococcygeal disorders, not elsewhere classified: Secondary | ICD-10-CM | POA: Diagnosis not present

## 2021-04-02 DIAGNOSIS — J929 Pleural plaque without asbestos: Secondary | ICD-10-CM | POA: Diagnosis not present

## 2021-04-02 DIAGNOSIS — M47814 Spondylosis without myelopathy or radiculopathy, thoracic region: Secondary | ICD-10-CM | POA: Diagnosis not present

## 2021-04-02 DIAGNOSIS — Z79899 Other long term (current) drug therapy: Secondary | ICD-10-CM | POA: Diagnosis not present

## 2021-04-02 DIAGNOSIS — M457 Ankylosing spondylitis of lumbosacral region: Secondary | ICD-10-CM | POA: Diagnosis not present

## 2021-04-02 DIAGNOSIS — Z111 Encounter for screening for respiratory tuberculosis: Secondary | ICD-10-CM | POA: Diagnosis not present

## 2021-04-02 DIAGNOSIS — M546 Pain in thoracic spine: Secondary | ICD-10-CM | POA: Diagnosis not present

## 2021-04-02 DIAGNOSIS — H209 Unspecified iridocyclitis: Secondary | ICD-10-CM | POA: Diagnosis not present

## 2021-04-03 ENCOUNTER — Ambulatory Visit: Payer: BC Managed Care – PPO | Admitting: Family Medicine

## 2021-04-09 DIAGNOSIS — M546 Pain in thoracic spine: Secondary | ICD-10-CM | POA: Diagnosis not present

## 2021-04-24 ENCOUNTER — Ambulatory Visit: Payer: BC Managed Care – PPO | Admitting: Family Medicine

## 2021-04-24 ENCOUNTER — Encounter: Payer: Self-pay | Admitting: Family Medicine

## 2021-04-24 ENCOUNTER — Telehealth: Payer: BC Managed Care – PPO | Admitting: Family Medicine

## 2021-04-24 ENCOUNTER — Other Ambulatory Visit: Payer: Self-pay

## 2021-04-24 VITALS — BP 139/102 | HR 70 | Temp 98.2°F | Resp 16 | Wt 285.3 lb

## 2021-04-24 DIAGNOSIS — M457 Ankylosing spondylitis of lumbosacral region: Secondary | ICD-10-CM | POA: Diagnosis not present

## 2021-04-24 DIAGNOSIS — Z1589 Genetic susceptibility to other disease: Secondary | ICD-10-CM | POA: Diagnosis not present

## 2021-04-24 DIAGNOSIS — E669 Obesity, unspecified: Secondary | ICD-10-CM | POA: Insufficient documentation

## 2021-04-24 DIAGNOSIS — Z6836 Body mass index (BMI) 36.0-36.9, adult: Secondary | ICD-10-CM

## 2021-04-24 DIAGNOSIS — F909 Attention-deficit hyperactivity disorder, unspecified type: Secondary | ICD-10-CM | POA: Diagnosis not present

## 2021-04-24 DIAGNOSIS — I10 Essential (primary) hypertension: Secondary | ICD-10-CM | POA: Diagnosis not present

## 2021-04-24 MED ORDER — HYDROCHLOROTHIAZIDE 12.5 MG PO TABS: 12.5000 mg | ORAL_TABLET | Freq: Every day | ORAL | 3 refills | Status: DC

## 2021-04-24 NOTE — Assessment & Plan Note (Signed)
New diagnosis Uncontrolled Has been elevated for an unknown amount of time Discussed not resuming Adderall until he can get blood pressure under control Stress at work is likely contributing as well as lifestyle changes Encourage diet and exercise Handout on DASH diet given Patient agrees to start HCTZ 12.5 mg daily Reviewed last metabolic panel Recheck in 1 month and repeat BMP
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
## 2021-04-24 NOTE — Assessment & Plan Note (Signed)
Followed by rheumatology On meloxicam currently, but will soon be starting Humira pending prior authorization Seeing some improvement

## 2021-04-24 NOTE — Assessment & Plan Note (Signed)
Chronic and uncontrolled Was previously well controlled on Adderall twice daily Discussed not resuming this when he has uncontrolled hypertension, but will reassess at his next visit about resuming Adderall

## 2021-04-24 NOTE — Assessment & Plan Note (Signed)
Discussed importance of healthy weight management Discussed diet and exercise  

## 2021-05-28 ENCOUNTER — Encounter: Payer: Self-pay | Admitting: Family Medicine

## 2021-05-28 ENCOUNTER — Other Ambulatory Visit: Payer: Self-pay

## 2021-05-28 ENCOUNTER — Ambulatory Visit: Payer: BC Managed Care – PPO | Admitting: Family Medicine

## 2021-05-28 VITALS — BP 106/70 | HR 72 | Temp 98.4°F | Resp 16 | Wt 283.6 lb

## 2021-05-28 DIAGNOSIS — F909 Attention-deficit hyperactivity disorder, unspecified type: Secondary | ICD-10-CM | POA: Diagnosis not present

## 2021-05-28 DIAGNOSIS — M457 Ankylosing spondylitis of lumbosacral region: Secondary | ICD-10-CM

## 2021-05-28 DIAGNOSIS — I10 Essential (primary) hypertension: Secondary | ICD-10-CM | POA: Diagnosis not present

## 2021-05-28 MED ORDER — HYDROCHLOROTHIAZIDE 12.5 MG PO TABS: 12.5000 mg | ORAL_TABLET | Freq: Every day | ORAL | 1 refills | Status: DC

## 2021-05-28 MED ORDER — AMPHETAMINE-DEXTROAMPHETAMINE 20 MG PO TABS: 20.0000 mg | ORAL_TABLET | Freq: Two times a day (BID) | ORAL | 0 refills | Status: DC

## 2021-05-28 NOTE — Assessment & Plan Note (Signed)
Still working to get Humira approved Seeing Rheum again soon

## 2021-05-28 NOTE — Assessment & Plan Note (Signed)
Previously well controlled on Adderall Will resume at previous dose now that BP is controlled

## 2021-05-28 NOTE — Progress Notes (Signed)
Established patient visit   Patient: Joshua Bartlett   DOB: 1981-01-23   40 y.o. Male  MRN: [id number] Visit Date: 05/28/2021  Today's healthcare provider: Lavon Paganini, MD   Chief Complaint  Patient presents with   Hypertension   Subjective  -------------------------------------------------------------------------------------------------------------------- Hypertension Pertinent negatives include no chest pain, headaches, neck pain, palpitations or shortness of breath.    Hypertension, follow-up  BP Readings from Last 3 Encounters:  05/28/21 106/70  04/24/21 (!) 139/102  02/04/20 124/84   Wt Readings from Last 3 Encounters:  05/28/21 283 lb 9.6 oz (128.6 kg)  04/24/21 285 lb 4.8 oz (129.4 kg)  02/04/20 253 lb (114.8 kg)     He was last seen for hypertension 1 months ago.  BP at that visit was 139/102. Management since that visit includes HCTZ 12.37m daily.  He reports excellent compliance with treatment. He is not having side effects.  He is following a Low fat, Low Sodium diet. He is not exercising. He does not smoke.  Use of agents associated with hypertension: none.   Outside blood pressures are not being checked. Symptoms: No chest pain No chest pressure  No palpitations No syncope  No dyspnea No orthopnea  No paroxysmal nocturnal dyspnea No lower extremity edema   Pertinent labs: Lab Results  Component Value Date   CHOL 197 03/14/2018   HDL 46 03/14/2018   LDLCALC 129 (H) 03/14/2018   TRIG 110 03/14/2018   CHOLHDL 4.3 03/14/2018   Lab Results  Component Value Date   NA 143 03/14/2018   K 4.4 03/14/2018   CREATININE 1.10 03/14/2018   GFRNONAA 87 03/14/2018   GFRAA 101 03/14/2018   GLUCOSE 95 03/14/2018     The ASCVD Risk score (Goff DC Jr., et al., 2013) failed to calculate for the following reasons:   The 2013 ASCVD risk score is only valid for ages 475to 730  Ankylosing Spondylitis  His insurance rejected coverage for Humira. For  replacement he is taking meloxicam. He is tolerating this medication. However his follow-up with rheumatology is scheduled for the beginning of next month for his ankylosing spondylitis.  ADHD He is agreeable to beginning 20 mg adderall after monitoring blood pressure.   ---------------------------------------------------------------------------------------------------   Patient Active Problem List   Diagnosis Date Noted   Primary hypertension 04/24/2021   Class 2 obesity without serious comorbidity with body mass index (BMI) of 36.0 to 36.9 in adult 04/24/2021   Ventral hernia without obstruction or gangrene 09/13/2018   Tobacco use disorder 06/12/2018   Iritis 05/04/2018   HLA B27 positive 04/13/2018   Anxiety 11/25/2017   History of uveitis 11/25/2017   Ankylosing spondylitis ([place] 11/25/2017   Adult ADHD 12/04/2014   Social History   Tobacco Use   Smoking status: Former    Packs/day: 0.30    Years: 0.00    Pack years: 0.00    Types: Cigarettes   Smokeless tobacco: Never   Tobacco comments:    started smoking at age 40 Vaping Use   Vaping Use: Every day  Substance Use Topics   Alcohol use: No   Drug use: No   Allergies  Allergen Reactions   Vicodin [Hydrocodone-Acetaminophen] Nausea And Vomiting   Hydrocodone Nausea Only      Medications: Outpatient Medications Prior to Visit  Medication Sig   HUMIRA PEN 40 MG/0.4ML PNKT    meloxicam (MOBIC) 15 MG tablet Take 15 mg by mouth daily.   [DISCONTINUED] amphetamine-dextroamphetamine (  ADDERALL) 20 MG tablet Take 1 tablet (20 mg total) by mouth 2 (two) times daily.   [DISCONTINUED] amphetamine-dextroamphetamine (ADDERALL) 20 MG tablet Take 1 tablet (20 mg total) by mouth 2 (two) times daily.   [DISCONTINUED] amphetamine-dextroamphetamine (ADDERALL) 20 MG tablet Take 1 tablet (20 mg total) by mouth 2 (two) times daily.   [DISCONTINUED] hydrochlorothiazide (HYDRODIURIL) 12.5 MG tablet Take 1 tablet (12.5 mg total) by  mouth daily.   No facility-administered medications prior to visit.    Review of Systems  Constitutional:  Negative for activity change, appetite change, chills, fatigue and fever.  HENT:  Negative for ear pain, sinus pressure, sinus pain and sore throat.   Eyes:  Negative for pain and visual disturbance.  Respiratory:  Negative for cough, chest tightness, shortness of breath and wheezing.   Cardiovascular:  Negative for chest pain, palpitations and leg swelling.  Gastrointestinal:  Negative for abdominal pain, blood in stool, diarrhea, nausea and vomiting.  Genitourinary:  Negative for flank pain, frequency and urgency.  Musculoskeletal:  Negative for back pain, myalgias and neck pain.  Neurological:  Negative for dizziness, weakness, light-headedness, numbness and headaches.       Objective  -------------------------------------------------------------------------------------------------------------------- BP 106/70 (BP Location: Left Arm, Patient Position: Sitting, Cuff Size: Large)   Pulse 72   Temp 98.4 F (36.9 C) (Oral)   Resp 16   Wt 283 lb 9.6 oz (128.6 kg)   BMI 36.41 kg/m  BP Readings from Last 3 Encounters:  05/28/21 106/70  04/24/21 (!) 139/102  02/04/20 124/84   Wt Readings from Last 3 Encounters:  05/28/21 283 lb 9.6 oz (128.6 kg)  04/24/21 285 lb 4.8 oz (129.4 kg)  02/04/20 253 lb (114.8 kg)      Physical Exam Vitals reviewed.  Constitutional:      General: He is not in acute distress.    Appearance: Normal appearance. He is not diaphoretic.  HENT:     Head: Normocephalic and atraumatic.  Eyes:     General: No scleral icterus.    Conjunctiva/sclera: Conjunctivae normal.  Cardiovascular:     Rate and Rhythm: Normal rate and regular rhythm.     Pulses: Normal pulses.     Heart sounds: Normal heart sounds. No murmur heard. Pulmonary:     Effort: Pulmonary effort is normal. No respiratory distress.     Breath sounds: Normal breath sounds. No wheezing  or rhonchi.  Abdominal:     General: There is no distension.     Palpations: Abdomen is soft.     Tenderness: There is no abdominal tenderness.  Musculoskeletal:     Cervical back: Neck supple.     Right lower leg: No edema.     Left lower leg: No edema.  Lymphadenopathy:     Cervical: No cervical adenopathy.  Skin:    General: Skin is warm and dry.     Capillary Refill: Capillary refill takes less than 2 seconds.     Findings: No rash.  Neurological:     Mental Status: He is alert and oriented to person, place, and time.     Cranial Nerves: No cranial nerve deficit.  Psychiatric:        Mood and Affect: Mood normal.        Behavior: Behavior normal.      No results found for any visits on 05/28/21.  Assessment & Plan  ---------------------------------------------------------------------------------------------------------------------- Problem List Items Addressed This Visit       Cardiovascular and Mediastinum   Primary  hypertension - Primary    Well controlled Continue current medications Recheck metabolic panel F/u in 3 months       Relevant Medications   hydrochlorothiazide (HYDRODIURIL) 12.5 MG tablet   Other Relevant Orders   Basic Metabolic Panel (BMET)     Musculoskeletal and Integument   Ankylosing spondylitis ([place])    Still working to get Humira approved Seeing Rheum again soon        Other   Adult ADHD    Previously well controlled on Adderall Will resume at previous dose now that BP is controlled         Return in about 2 months (around 07/28/2021) for CPE, as scheduled.      I,Essence Turner,acting as a Education administrator for Lavon Paganini, MD.,have documented all relevant documentation on the behalf of Lavon Paganini, MD,as directed by  Lavon Paganini, MD while in the presence of Lavon Paganini, MD.  I, Lavon Paganini, MD, have reviewed all documentation for this visit. The documentation on 05/28/21 for the exam, diagnosis, procedures,  and orders are all accurate and complete.   Tristain Daily, Dionne Bucy, MD, MPH [facility]

## 2021-05-28 NOTE — Assessment & Plan Note (Signed)
Well controlled Continue current medications Recheck metabolic panel F/u in 3 months  

## 2021-05-29 LAB — BASIC METABOLIC PANEL
BUN/Creatinine Ratio: 17 (ref 9–20)
BUN: 17 mg/dL (ref 6–20)
CO2: 24 mmol/L (ref 20–29)
Calcium: 9.4 mg/dL (ref 8.7–10.2)
Chloride: 101 mmol/L (ref 96–106)
Creatinine, Ser: 0.98 mg/dL (ref 0.76–1.27)
Glucose: 81 mg/dL (ref 65–99)
Potassium: 4.8 mmol/L (ref 3.5–5.2)
Sodium: 138 mmol/L (ref 134–144)
eGFR: 102 mL/min/{1.73_m2} (ref >59)

## 2021-06-13 DIAGNOSIS — Z20822 Contact with and (suspected) exposure to covid-19: Secondary | ICD-10-CM | POA: Diagnosis not present

## 2021-06-19 DIAGNOSIS — Z23 Encounter for immunization: Secondary | ICD-10-CM | POA: Diagnosis not present

## 2021-07-23 ENCOUNTER — Ambulatory Visit (INDEPENDENT_AMBULATORY_CARE_PROVIDER_SITE_OTHER): Payer: BC Managed Care – PPO | Admitting: Family Medicine

## 2021-07-23 ENCOUNTER — Encounter: Payer: Self-pay | Admitting: Family Medicine

## 2021-07-23 ENCOUNTER — Other Ambulatory Visit: Payer: Self-pay

## 2021-07-23 VITALS — BP 135/92 | HR 82 | Temp 98.5°F | Ht 74.0 in | Wt 276.8 lb

## 2021-07-23 DIAGNOSIS — I10 Essential (primary) hypertension: Secondary | ICD-10-CM

## 2021-07-23 DIAGNOSIS — Z0000 Encounter for general adult medical examination without abnormal findings: Secondary | ICD-10-CM | POA: Diagnosis not present

## 2021-07-23 DIAGNOSIS — F909 Attention-deficit hyperactivity disorder, unspecified type: Secondary | ICD-10-CM | POA: Diagnosis not present

## 2021-07-23 DIAGNOSIS — Z6835 Body mass index (BMI) 35.0-35.9, adult: Secondary | ICD-10-CM

## 2021-07-23 DIAGNOSIS — E66812 Obesity, class 2: Secondary | ICD-10-CM

## 2021-07-23 MED ORDER — AMPHETAMINE-DEXTROAMPHETAMINE 20 MG PO TABS: 20.0000 mg | ORAL_TABLET | Freq: Two times a day (BID) | ORAL | 0 refills | Status: DC

## 2021-07-23 MED ORDER — MELOXICAM 15 MG PO TABS: 15.0000 mg | ORAL_TABLET | Freq: Every day | ORAL | 1 refills | Status: DC

## 2021-07-23 MED ORDER — HYDROCHLOROTHIAZIDE 12.5 MG PO TABS: 12.5000 mg | ORAL_TABLET | Freq: Every day | ORAL | 1 refills | Status: DC
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
## 2021-07-23 NOTE — Assessment & Plan Note (Signed)
Improving, but BP slightly elevated Stopped HCTZ - will resume Recheck metabolic panel F/u in 3 month

## 2021-07-23 NOTE — Assessment & Plan Note (Signed)
Chronic and well controlled  Continue adderall at current dose Refilled x3

## 2021-07-23 NOTE — Assessment & Plan Note (Signed)
Discussed importance of healthy weight management Discussed diet and exercise  

## 2021-07-28 ENCOUNTER — Encounter: Payer: BC Managed Care – PPO | Admitting: Family Medicine

## 2021-08-04 DIAGNOSIS — M457 Ankylosing spondylitis of lumbosacral region: Secondary | ICD-10-CM | POA: Diagnosis not present

## 2021-08-04 DIAGNOSIS — Z7960 Long term (current) use of unspecified immunomodulators and immunosuppressants: Secondary | ICD-10-CM | POA: Diagnosis not present

## 2021-09-03 ENCOUNTER — Other Ambulatory Visit: Payer: Self-pay | Admitting: Family Medicine

## 2021-09-03 NOTE — Telephone Encounter (Signed)
Requested medication (s) are due for refill today: yes  Requested medication (s) are on the active medication list: yes   Last refill: 07/23/21 #60  0 refill  Future visit scheduled yes 10/29/21  Notes to clinic: Not delegated  Requested Prescriptions  Pending Prescriptions Disp Refills   amphetamine-dextroamphetamine (ADDERALL) 20 MG tablet 60 tablet 0    Sig: Take 1 tablet (20 mg total) by mouth 2 (two) times daily.     Not Delegated - Psychiatry:  Stimulants/ADHD Failed - 09/03/2021  2:40 PM      Failed - This refill cannot be delegated      Failed - Urine Drug Screen completed in last 360 days      Passed - Valid encounter within last 3 months    Recent Outpatient Visits           1 month ago Encounter for annual physical exam   [facility] [place], Dionne Bucy, MD   3 months ago Primary hypertension   [facility], Dionne Bucy, MD   4 months ago Adult ADHD   [facility] [place], Dionne Bucy, MD   1 year ago Annual physical exam   [facility] [place], Dionne Bucy, MD   1 year ago No-show for appointment   [facility], Dionne Bucy, MD       Future Appointments             In 1 month Bacigalupo, Dionne Bucy, MD [facility], [place]

## 2021-09-03 NOTE — Telephone Encounter (Signed)
Medication Refill - Medication:  amphetamine-dextroamphetamine (ADDERALL) 20 MG tablet   Has the patient contacted their pharmacy? Yes.   Contact PCP  Preferred Pharmacy (with phone number or street name):  [facility] [place], [place]  Has the patient been seen for an appointment in the last year OR does the patient have an upcoming appointment? Yes.    Agent: Please be advised that RX refills may take up to 3 business days. We ask that you follow-up with your pharmacy.

## 2021-09-04 MED ORDER — AMPHETAMINE-DEXTROAMPHETAMINE 20 MG PO TABS: 20.0000 mg | ORAL_TABLET | Freq: Two times a day (BID) | ORAL | 0 refills | Status: DC

## 2021-09-04 NOTE — Telephone Encounter (Signed)
Please advise refill?  LOV: 07/23/2021 NOV: 10/29/2021 Last refill: 07/23/2021 #60 w/3 refills

## 2021-10-20 ENCOUNTER — Other Ambulatory Visit: Payer: Self-pay | Admitting: Family Medicine

## 2021-10-20 NOTE — Telephone Encounter (Signed)
LOV: 07/23/2021  Thanks,   -Mickel Baas

## 2021-10-20 NOTE — Telephone Encounter (Signed)
Medication Refill - Medication: amphetamine-dextroamphetamine (ADDERALL) 20 MG tablet  Has the patient contacted their pharmacy? Yes.   (Agent: If no, request that the patient contact the pharmacy for the refill. If patient does not wish to contact the pharmacy document the reason why and proceed with request.) (Agent: If yes, when and what did the pharmacy advise?)  Preferred Pharmacy (with phone number or street name):  [id number] [name], [place] - [address]  [place] [address]  Phone: [phone] Fax: [phone]   Has the patient been seen for an appointment in the last year OR does the patient have an upcoming appointment? Yes.    Agent: Please be advised that RX refills may take up to 3 business days. We ask that you follow-up with your pharmacy.

## 2021-10-20 NOTE — Telephone Encounter (Signed)
Requested medication (s) are due for refill today:   Provider to review  Requested medication (s) are on the active medication list:   Yes  Future visit scheduled:   Yes in 1 wk   Last ordered: 09/04/2021 #60, 0 refills  Returned because it's a non delegated refill.   Requested Prescriptions  Pending Prescriptions Disp Refills   amphetamine-dextroamphetamine (ADDERALL) 20 MG tablet 60 tablet 0    Sig: Take 1 tablet (20 mg total) by mouth 2 (two) times daily.     Not Delegated - Psychiatry:  Stimulants/ADHD Failed - 10/20/2021 12:55 PM      Failed - This refill cannot be delegated      Failed - Urine Drug Screen completed in last 360 days      Passed - Valid encounter within last 3 months    Recent Outpatient Visits           2 months ago Encounter for annual physical exam   [facility] [employer], Dionne Bucy, MD   4 months ago Primary hypertension   [facility], Dionne Bucy, MD   5 months ago Adult ADHD   [facility] [place], Dionne Bucy, MD   1 year ago Annual physical exam   [facility] [place], Dionne Bucy, MD   1 year ago No-show for appointment   [facility], Dionne Bucy, MD       Future Appointments             In 1 week Bacigalupo, Dionne Bucy, MD [facility] [place]            Pr

## 2021-10-21 MED ORDER — AMPHETAMINE-DEXTROAMPHETAMINE 20 MG PO TABS: 20.0000 mg | ORAL_TABLET | Freq: Two times a day (BID) | ORAL | 0 refills | Status: DC

## 2021-10-29 ENCOUNTER — Other Ambulatory Visit: Payer: Self-pay

## 2021-10-29 ENCOUNTER — Ambulatory Visit (INDEPENDENT_AMBULATORY_CARE_PROVIDER_SITE_OTHER): Payer: BC Managed Care – PPO | Admitting: Family Medicine

## 2021-10-29 ENCOUNTER — Encounter: Payer: Self-pay | Admitting: Family Medicine

## 2021-10-29 VITALS — BP 132/89 | HR 82 | Temp 98.2°F | Resp 16 | Wt 279.1 lb

## 2021-10-29 DIAGNOSIS — F909 Attention-deficit hyperactivity disorder, unspecified type: Secondary | ICD-10-CM

## 2021-10-29 DIAGNOSIS — E669 Obesity, unspecified: Secondary | ICD-10-CM

## 2021-10-29 DIAGNOSIS — Z6835 Body mass index (BMI) 35.0-35.9, adult: Secondary | ICD-10-CM

## 2021-10-29 DIAGNOSIS — M457 Ankylosing spondylitis of lumbosacral region: Secondary | ICD-10-CM

## 2021-10-29 DIAGNOSIS — I10 Essential (primary) hypertension: Secondary | ICD-10-CM | POA: Diagnosis not present

## 2021-10-29 MED ORDER — AMPHETAMINE-DEXTROAMPHETAMINE 20 MG PO TABS: 20.0000 mg | ORAL_TABLET | Freq: Two times a day (BID) | ORAL | 0 refills | Status: DC

## 2021-10-29 NOTE — Progress Notes (Signed)
Established patient visit   Patient: Joshua Bartlett   DOB: 01/08/1981   41 y.o. Male  MRN: [id number] Visit Date: 10/29/2021  Today's healthcare provider: Lavon Paganini, MD    I,Joseline E Rosas,acting as a scribe for Lavon Paganini, MD.,have documented all relevant documentation on the behalf of Lavon Paganini, MD,as directed by  Lavon Paganini, MD while in the presence of Lavon Paganini, MD.   Chief Complaint  Patient presents with   follow-up HTN and ADHD   Subjective    HPI  Hypertension, follow-up  BP Readings from Last 3 Encounters:  10/29/21 132/89  07/23/21 (!) 135/92  05/28/21 106/70   Wt Readings from Last 3 Encounters:  10/29/21 279 lb 1.6 oz (126.6 kg)  07/23/21 276 lb 12.8 oz (125.6 kg)  05/28/21 283 lb 9.6 oz (128.6 kg)     He was last seen for hypertension 3 months ago.  BP at that visit was 135/92. Management since that visit includes resume HCTZ. Reports that he took it for a week, then he stopped taking it. Reports that he stopped the Meloxicam about 1 week in half. Reports that he noticed the Meloxicam was making his BP go up and made him sweaty.  He reports excellent compliance with treatment. He is not having side effects.  He is following a Regular diet. He is not exercising. He does not smoke. He does vape. Is very active with his work.   Outside blood pressures are not being checked. Symptoms: No chest pain No chest pressure  No palpitations No syncope  No dyspnea No orthopnea  No paroxysmal nocturnal dyspnea No lower extremity edema   Pertinent labs: Lab Results  Component Value Date   CHOL 197 03/14/2018   HDL 46 03/14/2018   LDLCALC 129 (H) 03/14/2018   TRIG 110 03/14/2018   CHOLHDL 4.3 03/14/2018   Lab Results  Component Value Date   NA 138 05/28/2021   K 4.8 05/28/2021   CREATININE 0.98 05/28/2021   EGFR 102 05/28/2021   GLUCOSE 81 05/28/2021     The ASCVD Risk score (Arnett DK, et al., 2019) failed  to calculate for the following reasons:   The 2019 ASCVD risk score is only valid for ages 63 to 67   ---------------------------------------------------------------------------------------------------  Follow up for ADHD  The patient was last seen for this 3 months ago. On Adderall. Stable.  Medications: Outpatient Medications Prior to Visit  Medication Sig   HUMIRA PEN 40 MG/0.4ML PNKT    hydrochlorothiazide (HYDRODIURIL) 12.5 MG tablet Take 1 tablet (12.5 mg total) by mouth daily.   [DISCONTINUED] amphetamine-dextroamphetamine (ADDERALL) 20 MG tablet Take 1 tablet (20 mg total) by mouth 2 (two) times daily.   [DISCONTINUED] amphetamine-dextroamphetamine (ADDERALL) 20 MG tablet Take 1 tablet (20 mg total) by mouth 2 (two) times daily.   [DISCONTINUED] amphetamine-dextroamphetamine (ADDERALL) 20 MG tablet Take 1 tablet (20 mg total) by mouth 2 (two) times daily.   [DISCONTINUED] meloxicam (MOBIC) 15 MG tablet Take 1 tablet (15 mg total) by mouth daily. (Patient not taking: Reported on 10/29/2021)   No facility-administered medications prior to visit.    Review of Systems per HPI     Objective    BP 132/89 (BP Location: Left Arm, Patient Position: Sitting, Cuff Size: Large)    Pulse 82    Temp 98.2 F (36.8 C) (Oral)    Resp 16    Wt 279 lb 1.6 oz (126.6 kg)    BMI 35.83 kg/m  {  Show previous vital signs (optional):23777}  Physical Exam Vitals reviewed.  Constitutional:      General: He is not in acute distress.    Appearance: Normal appearance. He is not diaphoretic.  HENT:     Head: Normocephalic and atraumatic.  Eyes:     General: No scleral icterus.    Conjunctiva/sclera: Conjunctivae normal.  Cardiovascular:     Rate and Rhythm: Normal rate and regular rhythm.     Pulses: Normal pulses.     Heart sounds: Normal heart sounds. No murmur heard. Pulmonary:     Effort: Pulmonary effort is normal. No respiratory distress.     Breath sounds: Normal breath sounds. No  wheezing or rhonchi.  Musculoskeletal:     Cervical back: Neck supple.     Right lower leg: No edema.     Left lower leg: No edema.  Lymphadenopathy:     Cervical: No cervical adenopathy.  Skin:    General: Skin is warm and dry.     Findings: No rash.  Neurological:     Mental Status: He is alert and oriented to person, place, and time.  Psychiatric:        Mood and Affect: Mood normal.        Behavior: Behavior normal.      No results found for any visits on 10/29/21.  Assessment & Plan     Problem List Items Addressed This Visit       Cardiovascular and Mediastinum   Primary hypertension - Primary    Fairly well controlled  Resume taking meds regularly Get labs from last visit F/u in 92m       Musculoskeletal and Integument   Ankylosing spondylitis ([place]    F/b Rheum Doing well on Humira        Other   Adult ADHD    Chronic and stable Continue adderall at current dose  Refilled x331m    Obesity    Discussed importance of healthy weight management Discussed diet and exercise       Relevant Medications   amphetamine-dextroamphetamine (ADDERALL) 20 MG tablet   amphetamine-dextroamphetamine (ADDERALL) 20 MG tablet   amphetamine-dextroamphetamine (ADDERALL) 20 MG tablet     Return in about 3 months (around 01/26/2022) for chronic disease f/u.      I, AnLavon PaganiniMD, have reviewed all documentation for this visit. The documentation on 10/29/21 for the exam, diagnosis, procedures, and orders are all accurate and complete.   Sakoya Win, AnDionne BucyMD, MPH [facility]

## 2021-10-29 NOTE — Assessment & Plan Note (Signed)
Fairly well controlled  Resume taking meds regularly Get labs from last visit F/u in 16m

## 2021-10-29 NOTE — Assessment & Plan Note (Signed)
F/b Rheum Doing well on Humira

## 2021-10-29 NOTE — Assessment & Plan Note (Signed)
Chronic and stable Continue adderall at current dose  Refilled x62m

## 2021-10-29 NOTE — Assessment & Plan Note (Signed)
Discussed importance of healthy weight management Discussed diet and exercise  

## 2021-11-30 ENCOUNTER — Other Ambulatory Visit: Payer: Self-pay | Admitting: Family Medicine

## 2021-11-30 DIAGNOSIS — Z79899 Other long term (current) drug therapy: Secondary | ICD-10-CM | POA: Diagnosis not present

## 2021-11-30 DIAGNOSIS — M457 Ankylosing spondylitis of lumbosacral region: Secondary | ICD-10-CM | POA: Diagnosis not present

## 2021-11-30 NOTE — Telephone Encounter (Signed)
Copied from [place] [phone]. Topic: Quick Communication - Rx Refill/Question ?>> Nov 30, 2021 12:39 PM Tessa Lerner A wrote: ?Medication: amphetamine-dextroamphetamine (ADDERALL) 20 MG tablet [[id number]]  ? ?cyclobenzaprine (FLEXERIL) 5 MG tablet [[id number]]  ? ? ?Has the patient contacted their pharmacy? No. ?(Agent: If no, request that the patient contact the pharmacy for the refill. If patient does not wish to contact the pharmacy document the reason why and proceed with request.) ?(Agent: If yes, when and what did the pharmacy advise?) ? ?Preferred Pharmacy (with phone number or street name): [id number] [name], [place] ?[place] [address] ?Phone: [phone] Fax: [phone] ?Hours: Not open 24 hours ? ? ?Has the patient been seen for an appointment in the last year OR does the patient have an upcoming appointment? Yes.   ? ?Agent: Please be advised that RX refills may take up to 3 business days. We ask that you follow-up with your pharmacy. ?

## 2021-12-01 NOTE — Telephone Encounter (Signed)
Requested medication (s) are due for refill today: flexeril discontinued 04/24/21 ? ?Requested medication (s) are on the active medication list: yes ? ?Adderall - 10/29/21 #60 0 refills ? ?Future visit scheduled: in 1 month   ? ?Notes to clinic:  not delegated per protocol. Adderall duplicate orders x 3. Flexeril discontinued 04/24/21 ? ? ?  ?Requested Prescriptions  ?Pending Prescriptions Disp Refills  ? amphetamine-dextroamphetamine (ADDERALL) 20 MG tablet 60 tablet 0  ?  Sig: Take 1 tablet (20 mg total) by mouth 2 (two) times daily.  ?  ? Not Delegated - Psychiatry:  Stimulants/ADHD Failed - 11/30/2021  5:13 PM  ?  ?  Failed - This refill cannot be delegated  ?  ?  Failed - Urine Drug Screen completed in last 360 days  ?  ?  Passed - Last BP in normal range  ?  BP Readings from Last 1 Encounters:  ?10/29/21 132/89  ?  ?  ?  ?  Passed - Last Heart Rate in normal range  ?  Pulse Readings from Last 1 Encounters:  ?10/29/21 82  ?  ?  ?  ?  Passed - Valid encounter within last 6 months  ?  Recent Outpatient Visits   ? ?      ? 1 month ago Primary hypertension  ? [facility]ig[facility], Dionne Bucy, MD  ? 4 months ago Encounter for annual physical exam  ? [facility] [place], Dionne Bucy, MD  ? 6 months ago Primary hypertension  ? [facility] Bacigal[place], Dionne Bucy, MD  ? 7 months ago Adult ADHD  ? [facility] Bacigal[facility], Dionne Bucy, MD  ? 1 year ago Annual physical exam  ? [facility], Dionne Bucy, MD  ? ?  ?  ?Future Appointments   ? ?        ? In 1 month Bacigalupo, Dionne Bucy, MD [facility]  ? ?  ? ?  ?  ?  ? cyclobenzaprine (FLEXERIL) 5 MG tablet 30 tablet 1  ?  Sig: Take 1 tablet (5 mg total) by mouth 3 (three) times daily as needed for muscle spasms.  ?  ? Not Delegated - Analgesics:  Muscle Relaxants Failed - 11/30/2021  5:13 PM  ?  ?  Failed - This refill cannot be delegated  ?  ?  Passed - Valid encounter within last 6  months  ?  Recent Outpatient Visits   ? ?      ? 1 month ago Primary hypertension  ? [facility] Bacigal[facility], Dionne Bucy, MD  ? 4 months ago Encounter for annual physical exam  ? [facility] [place], Dionne Bucy, MD  ? 6 months ago Primary hypertension  ? [facility] Bacigal[facility], Dionne Bucy, MD  ? 7 months ago Adult ADHD  ? [facility] Bacigal[facility], Dionne Bucy, MD  ? 1 year ago Annual physical exam  ? [facility], Dionne Bucy, MD  ? ?  ?  ?Future Appointments   ? ?        ? In 1 month Bacigalupo, Dionne Bucy, MD [facility]  ? ?  ? ?  ?  ?  ? ?

## 2021-12-03 MED ORDER — AMPHETAMINE-DEXTROAMPHETAMINE 20 MG PO TABS: 20.0000 mg | ORAL_TABLET | Freq: Two times a day (BID) | ORAL | 0 refills | Status: DC

## 2021-12-03 MED ORDER — CYCLOBENZAPRINE HCL 5 MG PO TABS: 5.0000 mg | ORAL_TABLET | Freq: Three times a day (TID) | ORAL | 1 refills | Status: DC | PRN

## 2022-01-06 ENCOUNTER — Other Ambulatory Visit: Payer: Self-pay | Admitting: Family Medicine

## 2022-01-06 NOTE — Telephone Encounter (Signed)
Medication Refill - Medication:  ?amphetamine-dextroamphetamine (ADDERALL) 20 MG tablet  ? ?Has the patient contacted their pharmacy? Yes.   ?Contact PCP ? ?Preferred Pharmacy (with phone number or street name):  ?[facility] #[id number] [name], [name]  ?[address] [place], [place] [address]  ?Phone:  [phone]  Fax:  [phone] ? ?Has the patient been seen for an appointment in the last year OR does the patient have an upcoming appointment? Yes.   ? ?Agent: Please be advised that RX refills may take up to 3 business days. We ask that you follow-up with your pharmacy. ?

## 2022-01-07 MED ORDER — AMPHETAMINE-DEXTROAMPHETAMINE 20 MG PO TABS
20.0000 mg | ORAL_TABLET | Freq: Two times a day (BID) | ORAL | 0 refills | Status: DC
Start: 2022-01-07 — End: 2022-05-24

## 2022-01-07 NOTE — Telephone Encounter (Signed)
Requested medication (s) are due for refill today: Yes ? ?Requested medication (s) are on the active medication list: Yes ? ?Last refill:  10/29/21 ? ?Future visit scheduled: Yes ? ?Notes to clinic:  See request. ? ? ? ?Requested Prescriptions  ?Pending Prescriptions Disp Refills  ? amphetamine-dextroamphetamine (ADDERALL) 20 MG tablet 60 tablet 0  ?  Sig: Take 1 tablet (20 mg total) by mouth 2 (two) times daily.  ?  ? Not Delegated - Psychiatry:  Stimulants/ADHD Failed - 01/06/2022  2:37 PM  ?  ?  Failed - This refill cannot be delegated  ?  ?  Failed - Urine Drug Screen completed in last 360 days  ?  ?  Passed - Last BP in normal range  ?  BP Readings from Last 1 Encounters:  ?10/29/21 132/89  ?  ?  ?  ?  Passed - Last Heart Rate in normal range  ?  Pulse Readings from Last 1 Encounters:  ?10/29/21 82  ?  ?  ?  ?  Passed - Valid encounter within last 6 months  ?  Recent Outpatient Visits   ? ?      ? 2 months ago Primary hypertension  ? [facility] Bacigalupo, Dionne Bucy, MD  ? 5 months ago Encounter for annual physical exam  ? [facility] [place], Dionne Bucy, MD  ? 7 months ago Primary hypertension  ? [facility] Bacigalupo, Dionne Bucy, MD  ? 8 months ago Adult ADHD  ? [facility] Bacigalupo, Dionne Bucy, MD  ? 1 year ago Annual physical exam  ? [facility], Dionne Bucy, MD  ? ?  ?  ?Future Appointments   ? ?        ? In 3 weeks Bacigalupo, Dionne Bucy, MD [facility]  ? ?  ? ?  ?  ?  ? ?

## 2022-01-22 ENCOUNTER — Other Ambulatory Visit: Payer: Self-pay | Admitting: Family Medicine

## 2022-01-22 NOTE — Telephone Encounter (Signed)
Requested medication (s) are due for refill today: yes ? ?Requested medication (s) are on the active medication list: no ? ?Last refill:  07/23/21 #90 1 RF ? ?Future visit scheduled: yes ? ?Notes to clinic:  med was dc'd "pt not taking medication" ? ? ?Requested Prescriptions  ?Pending Prescriptions Disp Refills  ? meloxicam (MOBIC) 15 MG tablet [Pharmacy Med Name: MELOXICAM 15MG TABLETS] 90 tablet 1  ?  Sig: TAKE 1 TABLET(15 MG) BY MOUTH DAILY  ?  ? Analgesics:  COX2 Inhibitors Failed - 01/22/2022  1:27 PM  ?  ?  Failed - Manual Review: Labs are only required if the patient has taken medication for more than 8 weeks.  ?  ?  Failed - HGB in normal range and within 360 days  ?  Hemoglobin  ?Date Value Ref Range Status  ?03/14/2018 13.7 13.0 - 17.7 g/dL Final  ?  ?  ?  ?  Failed - HCT in normal range and within 360 days  ?  Hematocrit  ?Date Value Ref Range Status  ?03/14/2018 40.7 37.5 - 51.0 % Final  ?  ?  ?  ?  Failed - AST in normal range and within 360 days  ?  AST  ?Date Value Ref Range Status  ?03/14/2018 24 0 - 40 IU/L Final  ? ?SGOT(AST)  ?Date Value Ref Range Status  ?03/19/2014 55 (H) 15 - 37 Unit/L Final  ?  ?  ?  ?  Failed - ALT in normal range and within 360 days  ?  ALT  ?Date Value Ref Range Status  ?03/14/2018 20 0 - 44 IU/L Final  ? ?SGPT (ALT)  ?Date Value Ref Range Status  ?03/19/2014 40 12 - 78 U/L Final  ?  ?  ?  ?  Passed - Cr in normal range and within 360 days  ?  Creatinine  ?Date Value Ref Range Status  ?03/19/2014 1.26 0.60 - 1.30 mg/dL Final  ? ?Creatinine, Ser  ?Date Value Ref Range Status  ?05/28/2021 0.98 0.76 - 1.27 mg/dL Final  ?  ?  ?  ?  Passed - eGFR is 30 or above and within 360 days  ?  EGFR (African American)  ?Date Value Ref Range Status  ?03/19/2014 >60  Final  ? ?GFR calc Af Wyvonnia Lora  ?Date Value Ref Range Status  ?03/14/2018 101 >59 mL/min/1.73 Final  ? ?EGFR (Non-African Amer.)  ?Date Value Ref Range Status  ?03/19/2014 >60  Final  ?  Comment:  ?  eGFR values <35m/min/1.73 m2  may be an indication of chronic ?kidney disease (CKD). ?Calculated eGFR is useful in patients with stable renal function. ?The eGFR calculation will not be reliable in acutely ill patients ?when serum creatinine is changing rapidly. It is not useful in  ?patients on dialysis. The eGFR calculation may not be applicable ?to patients at the low and high extremes of body sizes, pregnant ?women, and vegetarians. ?  ? ?GFR calc non Af Amer  ?Date Value Ref Range Status  ?03/14/2018 87 >59 mL/min/1.73 Final  ? ?eGFR  ?Date Value Ref Range Status  ?05/28/2021 102 >59 mL/min/1.73 Final  ?  ?  ?  ?  Passed - Patient is not pregnant  ?  ?  Passed - Valid encounter within last 12 months  ?  Recent Outpatient Visits   ? ?      ? [facility]  ? 6  months ago Encounter for annual physical exam  ? [facility] [place], Dionne Bucy, MD  ? 7 months ago Primary hypertension  ? [facility] Bacigalupo, Dionne Bucy, MD  ? 9 months ago Adult ADHD  ? [facility] Bacigalupo, Dionne Bucy, MD  ? 1 year ago Annual physical exam  ? [facility], Dionne Bucy, MD  ? ?  ?  ?Future Appointments   ? ?        ? In 6 days Bacigalupo, Dionne Bucy, MD [facility]  ? ?  ? ? ?  ?  ?  ? ? ? ? ?

## 2022-01-27 NOTE — Progress Notes (Deleted)
Established patient visit   Patient: Joshua Bartlett   DOB: [birth year]   41 y.o. Male  MRN: [id number] Visit Date: 01/28/2022  Today's healthcare provider: Lavon Paganini, MD   No chief complaint on file.  Subjective    HPI  Hypertension, follow-up  BP Readings from Last 3 Encounters:  10/29/21 132/89  07/23/21 (!) 135/92  05/28/21 106/70   Wt Readings from Last 3 Encounters:  10/29/21 279 lb 1.6 oz (126.6 kg)  07/23/21 276 lb 12.8 oz (125.6 kg)  05/28/21 283 lb 9.6 oz (128.6 kg)     He was last seen for hypertension 3 months ago.  BP at that visit was 132/89. Management since that visit includes Resume taking meds regularly.  He reports {excellent/good/fair/poor:19665} compliance with treatment. He {is/is not:9024} having side effects. {document side effects if present:1} He is following a {diet:21022986} diet. He {is/is not:9024} exercising. He does not smoke. Vape only.   Outside blood pressures are {***enter patient reported home BP readings, or 'not being checked':1}. Symptoms: {Yes/No:20286} chest pain {Yes/No:20286} chest pressure  {Yes/No:20286} palpitations {Yes/No:20286} syncope  {Yes/No:20286} dyspnea {Yes/No:20286} orthopnea  {Yes/No:20286} paroxysmal nocturnal dyspnea {Yes/No:20286} lower extremity edema   Pertinent labs Lab Results  Component Value Date   CHOL 197 03/14/2018   HDL 46 03/14/2018   LDLCALC 129 (H) 03/14/2018   TRIG 110 03/14/2018   CHOLHDL 4.3 03/14/2018   Lab Results  Component Value Date   NA 138 05/28/2021   K 4.8 05/28/2021   CREATININE 0.98 05/28/2021   EGFR 102 05/28/2021   GLUCOSE 81 05/28/2021     The ASCVD Risk score (Arnett DK, et al., 2019) failed to calculate for the following reasons:   The 2019 ASCVD risk score is only valid for ages 95 to 10  ---------------------------------------------------------------------------------------------------  Follow up for Adult ADHD  The patient was last seen for  this 3 months ago. Changes made at last visit include Continue adderall at current dose .  He reports {excellent/good/fair/poor:19665} compliance with treatment. He feels that condition is {improved/worse/unchanged:3041574}. He {is/is not:21021397} having side effects. ***  -----------------------------------------------------------------------------------------  Medications: Outpatient Medications Prior to Visit  Medication Sig   amphetamine-dextroamphetamine (ADDERALL) 20 MG tablet Take 1 tablet (20 mg total) by mouth 2 (two) times daily.   amphetamine-dextroamphetamine (ADDERALL) 20 MG tablet Take 1 tablet (20 mg total) by mouth 2 (two) times daily.   amphetamine-dextroamphetamine (ADDERALL) 20 MG tablet Take 1 tablet (20 mg total) by mouth 2 (two) times daily.   cyclobenzaprine (FLEXERIL) 5 MG tablet Take 1 tablet (5 mg total) by mouth 3 (three) times daily as needed for muscle spasms.   HUMIRA PEN 40 MG/0.4ML PNKT    hydrochlorothiazide (HYDRODIURIL) 12.5 MG tablet Take 1 tablet (12.5 mg total) by mouth daily.   meloxicam (MOBIC) 15 MG tablet TAKE 1 TABLET(15 MG) BY MOUTH DAILY   No facility-administered medications prior to visit.    Review of Systems      Objective    There were no vitals taken for this visit. BP Readings from Last 3 Encounters:  10/29/21 132/89  07/23/21 (!) 135/92  05/28/21 106/70   Wt Readings from Last 3 Encounters:  10/29/21 279 lb 1.6 oz (126.6 kg)  07/23/21 276 lb 12.8 oz (125.6 kg)  05/28/21 283 lb 9.6 oz (128.6 kg)      Physical Exam  ***  No results found for any visits on 01/28/22.  Assessment & Plan     ***  No  follow-ups on file.      {provider attestation***:1}   Lavon Paganini, MD  [facility] [phone] (phone) [phone] (fax)  [place]

## 2022-01-28 ENCOUNTER — Ambulatory Visit: Payer: BC Managed Care – PPO | Admitting: Family Medicine

## 2022-03-02 DIAGNOSIS — H209 Unspecified iridocyclitis: Secondary | ICD-10-CM | POA: Diagnosis not present

## 2022-03-02 DIAGNOSIS — Z79899 Other long term (current) drug therapy: Secondary | ICD-10-CM | POA: Diagnosis not present

## 2022-03-02 DIAGNOSIS — M457 Ankylosing spondylitis of lumbosacral region: Secondary | ICD-10-CM | POA: Diagnosis not present

## 2022-04-14 ENCOUNTER — Telehealth: Payer: Self-pay

## 2022-04-14 NOTE — Telephone Encounter (Signed)
Copied from [place] [id number]. Topic: General - Inquiry >> Apr 14, 2022  3:36 PM Marcellus Scott wrote: Reason for CRM: Pt is requesting a list of most recent appointments for insurance purposes. Pt stated that his employer requires proof that he has been seen by PCP.  The most recent appointment was on 10/29/2021.  Pt is requesting to pick this up.  Please advise. >> Apr 14, 2022  4:59 PM Chapman Fitch wrote: Pts wife will pick up paper work tomorrow / he wasn't able to make it today

## 2022-05-18 ENCOUNTER — Telehealth: Payer: Self-pay | Admitting: Family Medicine

## 2022-05-18 NOTE — Telephone Encounter (Signed)
Patient called, would like a refill of the following: Requested Prescriptions   Pending Prescriptions Disp Refills   amphetamine-dextroamphetamine (ADDERALL) 20 MG tablet 60 tablet 0    Sig: Take 1 tablet (20 mg total) by mouth 2 (two) times daily.   Pleas sent to: [facility] [place], [place], [place] [address].

## 2022-05-19 NOTE — Telephone Encounter (Signed)
Requested medication (s) are due for refill today: yes  Requested medication (s) are on the active medication list: yes  Last refill:  01/07/22 #60 0 refills  Future visit scheduled: no  Notes to clinic:  not delegated per protocol. Do you want to refill Rx? Called patient to schedule appt . No answer, LVMTCB     Requested Prescriptions  Pending Prescriptions Disp Refills   amphetamine-dextroamphetamine (ADDERALL) 20 MG tablet 60 tablet 0    Sig: Take 1 tablet (20 mg total) by mouth 2 (two) times daily.     Not Delegated - Psychiatry:  Stimulants/ADHD Failed - 05/18/2022  4:19 PM      Failed - This refill cannot be delegated      Failed - Urine Drug Screen completed in last 360 days      Failed - Valid encounter within last 6 months    Recent Outpatient Visits           6 months ago Primary hypertension   [place], Dionne Bucy, MD   10 months ago Encounter for annual physical exam   [facility], Dionne Bucy, MD   11 months ago Primary hypertension   [facility], Dionne Bucy, MD   1 year ago Adult ADHD   [facility], Dionne Bucy, MD   2 years ago Annual physical exam   [facility] [place], MD              Passed - Last BP in normal range    BP Readings from Last 1 Encounters:  10/29/21 132/89         Passed - Last Heart Rate in normal range    Pulse Readings from Last 1 Encounters:  10/29/21 82

## 2022-05-19 NOTE — Telephone Encounter (Signed)
Called patient to schedule appt for medication refills. No answer, LVMTCB #[phone]. 

## 2022-05-21 NOTE — Telephone Encounter (Signed)
Patient called in now has an appt to get his med , amphetamine-dextroamphetamine (ADDERALL) 20 MG tablet refilled. [facility] : [place], [place], [place] [address].  [phone]

## 2022-05-24 ENCOUNTER — Encounter: Payer: Self-pay | Admitting: Family Medicine

## 2022-05-24 ENCOUNTER — Telehealth (INDEPENDENT_AMBULATORY_CARE_PROVIDER_SITE_OTHER): Payer: BC Managed Care – PPO | Admitting: Family Medicine

## 2022-05-24 DIAGNOSIS — I10 Essential (primary) hypertension: Secondary | ICD-10-CM

## 2022-05-24 DIAGNOSIS — F909 Attention-deficit hyperactivity disorder, unspecified type: Secondary | ICD-10-CM

## 2022-05-24 MED ORDER — AMPHETAMINE-DEXTROAMPHETAMINE 20 MG PO TABS: 20.0000 mg | ORAL_TABLET | Freq: Two times a day (BID) | ORAL | 0 refills | Status: DC

## 2022-05-24 NOTE — Assessment & Plan Note (Signed)
Chronic. Doing well on current regimen, no changes. PDMP reviewed. Refills sent. F/u 3 months.

## 2022-05-24 NOTE — Assessment & Plan Note (Signed)
Recently restarted medication, tolerating well. Encouraged checking BP at home. Reviewed recent labs from Rheum office. F/u 3 months for in office exam/BP measurement.

## 2022-05-24 NOTE — Progress Notes (Signed)
MyChart Video Visit    Virtual Visit via Video Note   This visit type was conducted due to national recommendations for restrictions regarding the COVID-19 Pandemic (e.g. social distancing) in an effort to limit this patient's exposure and mitigate transmission in our community. This patient is at least at moderate risk for complications without adequate follow up. This format is felt to be most appropriate for this patient at this time. Physical exam was limited by quality of the video and audio technology used for the visit.   Patient location: work Provider location: [facility]  I discussed the limitations of evaluation and management by telemedicine and the availability of in person appointments. The patient expressed understanding and agreed to proceed.  Patient: Joshua Bartlett   DOB: 1981/07/14   41 y.o. Male  MRN: [id number] Visit Date: 05/24/2022  Today's healthcare provider: Myles Gip, DO   No chief complaint on file.  Subjective    HPI   Hypertension: - Medications: HCTZ - Compliance: good, some missed doses. Good compliance for the past 2 weeks.  - Checking BP at home: no - Denies any SOB, CP, vision changes, LE edema, medication SEs, or symptoms of hypotension  ADHD - Meds: adderall - Last follow up: 10/29/21 - Denies loss of appetite, GI upset, sleep disturbances, vision changes - notices if doesn't take. Sometimes forgets to take afternoon dose.  - years ago tried extended release, had more sleep troubles.  - Weight changes? no - Work performance: good, works at [employer] at home and with peers: good   Medications: Outpatient Medications Prior to Visit  Medication Sig   cyclobenzaprine (FLEXERIL) 5 MG tablet Take 1 tablet (5 mg total) by mouth 3 (three) times daily as needed for muscle spasms.   HUMIRA PEN 40 MG/0.4ML PNKT    hydrochlorothiazide (HYDRODIURIL) 12.5 MG tablet Take 1 tablet (12.5 mg total) by mouth daily.    meloxicam (MOBIC) 15 MG tablet TAKE 1 TABLET(15 MG) BY MOUTH DAILY   [DISCONTINUED] amphetamine-dextroamphetamine (ADDERALL) 20 MG tablet Take 1 tablet (20 mg total) by mouth 2 (two) times daily.   [DISCONTINUED] amphetamine-dextroamphetamine (ADDERALL) 20 MG tablet Take 1 tablet (20 mg total) by mouth 2 (two) times daily.   [DISCONTINUED] amphetamine-dextroamphetamine (ADDERALL) 20 MG tablet Take 1 tablet (20 mg total) by mouth 2 (two) times daily.   No facility-administered medications prior to visit.       Objective    There were no vitals taken for this visit.     Physical Exam  Well appearing, in NAD. Speaks in full sentences. Comfortable WOB on RA. No resp distress.      Assessment & Plan     Adult ADHD Chronic. Doing well on current regimen, no changes. PDMP reviewed. Refills sent. F/u 3 months.  Primary hypertension Recently restarted medication, tolerating well. Encouraged checking BP at home. Reviewed recent labs from Rheum office. F/u 3 months for in office exam/BP measurement.   Return in about 3 months (around 08/24/2022) for bp, adhd.     I discussed the assessment and treatment plan with the patient. The patient was provided an opportunity to ask questions and all were answered. The patient agreed with the plan and demonstrated an understanding of the instructions.   The patient was advised to call back or seek an in-person evaluation if the symptoms worsen or if the condition fails to improve as anticipated.  I provided 13 minutes of non-face-to-face time during this encounter.  Myles Gip, [place] [phone] (phone) [phone] (fax)  [place]

## 2022-06-14 ENCOUNTER — Telehealth: Payer: Self-pay | Admitting: Family Medicine

## 2022-06-14 MED ORDER — AMPHETAMINE-DEXTROAMPHETAMINE 30 MG PO TABS: 30.0000 mg | ORAL_TABLET | Freq: Every day | ORAL | 0 refills | Status: DC

## 2022-06-14 NOTE — Telephone Encounter (Signed)
Pt is calling to ask if he can increase the amphetamine-dextroamphetamine (ADDERALL) 20 MG tablet [[id number]]  to '30mg'$  . The '20mg'$  is on back ordered. Pt is currently taking one and a half pills. Please advise [address]  [phone]

## 2022-06-18 DIAGNOSIS — Z23 Encounter for immunization: Secondary | ICD-10-CM | POA: Diagnosis not present

## 2022-06-22 ENCOUNTER — Telehealth: Payer: Self-pay

## 2022-06-22 NOTE — Patient Outreach (Signed)
  Care Coordination   06/22/2022 Name: Joshua Bartlett MRN: [id number] DOB: March 04, 1981   Care Coordination Outreach Attempts:  An unsuccessful telephone outreach was attempted today to offer the patient information about available care coordination services as a benefit of their health plan.   Follow Up Plan:  Additional outreach attempts will be made to offer the patient care coordination information and services.   Encounter Outcome:  No Answer  Care Coordination Interventions Activated:  No   Care Coordination Interventions:  No, not indicated    Noreene Larsson RN, MSN, [facility]  Mobile: [phone]

## 2022-07-06 DIAGNOSIS — Z79899 Other long term (current) drug therapy: Secondary | ICD-10-CM | POA: Diagnosis not present

## 2022-07-06 DIAGNOSIS — M457 Ankylosing spondylitis of lumbosacral region: Secondary | ICD-10-CM | POA: Diagnosis not present

## 2022-07-06 DIAGNOSIS — H209 Unspecified iridocyclitis: Secondary | ICD-10-CM | POA: Diagnosis not present

## 2022-07-20 ENCOUNTER — Telehealth: Payer: Self-pay | Admitting: Family Medicine

## 2022-07-20 NOTE — Telephone Encounter (Signed)
Medication Refill - Medication: amphetamine-dextroamphetamine (ADDERALL) 20 MG tablet [[id number]]   Has the patient contacted their pharmacy? Yes.   (Agent: If no, request that the patient contact the pharmacy for the refill. If patient does not wish to contact the pharmacy document the reason why and proceed with request.) (Agent: If yes, when and what did the pharmacy advise?)  Preferred Pharmacy (with phone number or street name): [facility] #[id number] [name], [place] [address]  [place], [place] [address]  Phone:  [phone]  Fax:  [phone]  DEA #:  [id number] Has the patient been seen for an appointment in the last year OR does the patient have an upcoming appointment? Yes.    Agent: Please be advised that RX refills may take up to 3 business days. We ask that you follow-up with your pharmacy.

## 2022-07-21 NOTE — Telephone Encounter (Signed)
Requested medication (s) are due for refill today: yes  Requested medication (s) are on the active medication list: yes  Last refill:  05/24/22 #60 0 refills  Future visit scheduled: no  Notes to clinic:  not delegated per protocol. Do you want to refill Rx?     Requested Prescriptions  Pending Prescriptions Disp Refills   amphetamine-dextroamphetamine (ADDERALL) 20 MG tablet 60 tablet 0    Sig: Take 1 tablet (20 mg total) by mouth 2 (two) times daily.     Not Delegated - Psychiatry:  Stimulants/ADHD Failed - 07/20/2022  4:42 PM      Failed - This refill cannot be delegated      Failed - Urine Drug Screen completed in last 360 days      Passed - Last BP in normal range    BP Readings from Last 1 Encounters:  10/29/21 132/89         Passed - Last Heart Rate in normal range    Pulse Readings from Last 1 Encounters:  10/29/21 82         Passed - Valid encounter within last 6 months    Recent Outpatient Visits           1 month ago Adult ADHD   [facility] Myles Gip, DO   8 months ago Primary hypertension   [place], Dionne Bucy, MD   12 months ago Encounter for annual physical exam   [facility], Dionne Bucy, MD   1 year ago Primary hypertension   [place], Dionne Bucy, MD   1 year ago Adult ADHD   [facility] [place], Dionne Bucy, MD

## 2022-07-28 NOTE — Telephone Encounter (Addendum)
Patient calling back checking on the status of his amphetamine-dextroamphetamine (ADDERALL) 20 MG tablet. Patient states its been more then 72 hour and would like a follow up call. Patient would like a follow up call, please note the status.   [facility] #[id number] [name], [place] AT [address] [place]  [address] [place], [place] [address]  Phone:  [phone]  Fax:  [phone]

## 2022-07-29 ENCOUNTER — Other Ambulatory Visit: Payer: Self-pay | Admitting: Family Medicine

## 2022-07-29 MED ORDER — AMPHETAMINE-DEXTROAMPHETAMINE 30 MG PO TABS: 30.0000 mg | ORAL_TABLET | Freq: Every day | ORAL | 0 refills | Status: DC

## 2022-07-29 NOTE — Progress Notes (Signed)
These are for 2 different doses. I refilled the 30 mg dose (the latest). Needs f/u appt later this month (3 months from last visit)

## 2022-08-31 ENCOUNTER — Other Ambulatory Visit: Payer: Self-pay | Admitting: Family Medicine

## 2022-08-31 NOTE — Telephone Encounter (Signed)
Medication Refill - Medication: amphetamine-dextroamphetamine (ADDERALL) 20 MG tablet   Has the patient contacted their pharmacy? No   Preferred Pharmacy (with phone number or street name):  [facility] #[id number] [name], [place] Phone: [phone]  Fax: [phone]     Has the patient been seen for an appointment in the last year OR does the patient have an upcoming appointment? Yes.    Please assist patient further

## 2022-08-31 NOTE — Telephone Encounter (Signed)
Requested medication (s) are due for refill today:yes  Requested medication (s) are on the active medication list: yes  Last refill:  05/24/22  Future visit scheduled: yes  Notes to clinic:  Unable to refill per protocol, cannot delegate.      Requested Prescriptions  Pending Prescriptions Disp Refills   amphetamine-dextroamphetamine (ADDERALL) 20 MG tablet 60 tablet 0    Sig: Take 1 tablet (20 mg total) by mouth 2 (two) times daily.     Not Delegated - Psychiatry:  Stimulants/ADHD Failed - 08/31/2022  1:23 PM      Failed - This refill cannot be delegated      Failed - Urine Drug Screen completed in last 360 days      Passed - Last BP in normal range    BP Readings from Last 1 Encounters:  10/29/21 132/89         Passed - Last Heart Rate in normal range    Pulse Readings from Last 1 Encounters:  10/29/21 82         Passed - Valid encounter within last 6 months    Recent Outpatient Visits           3 months ago Adult ADHD   [facility] Myles Gip, DO   10 months ago Primary hypertension   [facility] [place], Dionne Bucy, MD   1 year ago Encounter for annual physical exam   [facility] [place], Dionne Bucy, MD   1 year ago Primary hypertension   [facility] [place], Dionne Bucy, MD   1 year ago Adult ADHD   [place], Dionne Bucy, MD       Future Appointments             In 4 weeks [place], Dionne Bucy, MD [facility], [place]

## 2022-09-02 MED ORDER — AMPHETAMINE-DEXTROAMPHETAMINE 20 MG PO TABS: 20.0000 mg | ORAL_TABLET | Freq: Two times a day (BID) | ORAL | 0 refills | Status: DC

## 2022-09-28 ENCOUNTER — Ambulatory Visit: Payer: BC Managed Care – PPO | Admitting: Family Medicine

## 2022-10-06 ENCOUNTER — Other Ambulatory Visit: Payer: Self-pay | Admitting: Family Medicine

## 2022-10-06 NOTE — Telephone Encounter (Signed)
Medication Refill - Medication: amphetamine-dextroamphetamine (ADDERALL) 20 MG tablet   Has the patient contacted their pharmacy? No.  Preferred Pharmacy (with phone number or street name):  [id number] - [place], [place] - [address] Phone: [phone]  Fax: [phone]     Has the patient been seen for an appointment in the last year OR does the patient have an upcoming appointment? Yes.    Agent: Please be advised that RX refills may take up to 3 business days. We ask that you follow-up with your pharmacy.  

## 2022-10-07 NOTE — Telephone Encounter (Signed)
Requested medication (s) are due for refill today: Yes  Requested medication (s) are on the active medication list: Yes  Last refill:  09/02/22  Future visit scheduled: No  Notes to clinic:  See request.    Requested Prescriptions  Pending Prescriptions Disp Refills   amphetamine-dextroamphetamine (ADDERALL) 20 MG tablet 60 tablet 0    Sig: Take 1 tablet (20 mg total) by mouth 2 (two) times daily.     Not Delegated - Psychiatry:  Stimulants/ADHD Failed - 10/06/2022  5:13 PM      Failed - This refill cannot be delegated      Failed - Urine Drug Screen completed in last 360 days      Passed - Last BP in normal range    BP Readings from Last 1 Encounters:  10/29/21 132/89         Passed - Last Heart Rate in normal range    Pulse Readings from Last 1 Encounters:  10/29/21 82         Passed - Valid encounter within last 6 months    Recent Outpatient Visits           4 months ago Adult ADHD   [facility] Myles Gip, DO   11 months ago Primary hypertension   [place], Dionne Bucy, MD   1 year ago Encounter for annual physical exam   [facility] [place], Dionne Bucy, MD   1 year ago Primary hypertension   [place], Dionne Bucy, MD   1 year ago Adult ADHD   [facility] [place], Dionne Bucy, MD

## 2022-10-08 NOTE — Telephone Encounter (Signed)
Please review. Last office visit 05/24/2022.  KP

## 2022-10-11 MED ORDER — AMPHETAMINE-DEXTROAMPHETAMINE 20 MG PO TABS: 20.0000 mg | ORAL_TABLET | Freq: Two times a day (BID) | ORAL | 0 refills | Status: DC

## 2022-11-30 ENCOUNTER — Other Ambulatory Visit: Payer: Self-pay | Admitting: Family Medicine

## 2022-11-30 NOTE — Telephone Encounter (Signed)
Medication Refill - Medication: amphetamine-dextroamphetamine (ADDERALL) 20 MG tablet   Has the patient contacted their pharmacy? No.  Preferred Pharmacy (with phone number or street name):  [facility] [id number] [name], [place] Phone: [phone]  Fax: [phone]     Has the patient been seen for an appointment in the last year OR does the patient have an upcoming appointment? Yes.    Agent: Please be advised that RX refills may take up to 3 business days. We ask that you follow-up with your pharmacy.

## 2022-12-01 DIAGNOSIS — M457 Ankylosing spondylitis of lumbosacral region: Secondary | ICD-10-CM | POA: Diagnosis not present

## 2022-12-01 DIAGNOSIS — Z79899 Other long term (current) drug therapy: Secondary | ICD-10-CM | POA: Diagnosis not present

## 2022-12-01 NOTE — Telephone Encounter (Signed)
Requested medication (s) are due for refill today: yes  Requested medication (s) are on the active medication list: yes    Last refill: 09/02/22  #60  0 refills  Future visit scheduled no  Notes to clinic: Not delegated, please review. Thank you.  Requested Prescriptions  Pending Prescriptions Disp Refills   amphetamine-dextroamphetamine (ADDERALL) 20 MG tablet 60 tablet 0    Sig: Take 1 tablet (20 mg total) by mouth 2 (two) times daily.     Not Delegated - Psychiatry:  Stimulants/ADHD Failed - 11/30/2022  5:58 PM      Failed - This refill cannot be delegated      Failed - Urine Drug Screen completed in last 360 days      Failed - Valid encounter within last 6 months    Recent Outpatient Visits           6 months ago Adult ADHD   [place], DO   1 year ago Primary hypertension   [place], Dionne Bucy, MD   1 year ago Encounter for annual physical exam   [place], Dionne Bucy, MD   1 year ago Primary hypertension   [place], Dionne Bucy, MD   1 year ago Adult ADHD   [place], MD              Passed - Last BP in normal range    BP Readings from Last 1 Encounters:  10/29/21 132/89         Passed - Last Heart Rate in normal range    Pulse Readings from Last 1 Encounters:  10/29/21 82

## 2022-12-02 MED ORDER — AMPHETAMINE-DEXTROAMPHETAMINE 20 MG PO TABS: 20.0000 mg | ORAL_TABLET | Freq: Two times a day (BID) | ORAL | 0 refills | Status: DC

## 2023-02-11 ENCOUNTER — Other Ambulatory Visit: Payer: Self-pay | Admitting: Family Medicine

## 2023-02-11 NOTE — Telephone Encounter (Signed)
Requested medication (s) are due for refill today: yes  Requested medication (s) are on the active medication list: yes  Last refill:  10/11/22  Future visit scheduled: yes  Notes to clinic:  Unable to refill per protocol, cannot delegate.      Requested Prescriptions  Pending Prescriptions Disp Refills   amphetamine-dextroamphetamine (ADDERALL) 20 MG tablet 60 tablet 0    Sig: Take 1 tablet (20 mg total) by mouth 2 (two) times daily. Need appt before next refill     Not Delegated - Psychiatry:  Stimulants/ADHD Failed - 02/11/2023  9:55 AM      Failed - This refill cannot be delegated      Failed - Urine Drug Screen completed in last 360 days      Failed - Valid encounter within last 6 months    Recent Outpatient Visits           8 months ago Adult ADHD   [facility] Caro [place], DO   1 year ago Primary hypertension   [facility] [place], Marzella Schlein, MD   1 year ago Encounter for annual physical exam   [facility] [place], Marzella Schlein, MD   1 year ago Primary hypertension   [facility] [place], Marzella Schlein, MD   1 year ago Adult ADHD   [facility] Erasmo Downer, MD              Passed - Last BP in normal range    BP Readings from Last 1 Encounters:  10/29/21 132/89         Passed - Last Heart Rate in normal range    Pulse Readings from Last 1 Encounters:  10/29/21 82

## 2023-02-11 NOTE — Telephone Encounter (Signed)
Medication Refill - Medication: amphetamine-dextroamphetamine (ADDERALL) 20 MG tablet [[id number]]   Has the patient contacted their pharmacy? Yes.   (Agent: If no, request that the patient contact the pharmacy for the refill. If patient does not wish to contact the pharmacy document the reason why and proceed with request.) (Agent: If yes, when and what did the pharmacy advise?)  Preferred Pharmacy (with phone number or street name): [id number] [place] Rough, [place] - [address] AT [facility] OF [address] [place], [place] [address] Phone: [phone]  Fax: [phone]  Has the patient been seen for an appointment in the last year OR does the patient have an upcoming appointment? Yes.    Agent: Please be advised that RX refills may take up to 3 business days. We ask that you follow-up with your pharmacy.

## 2023-02-16 ENCOUNTER — Ambulatory Visit (INDEPENDENT_AMBULATORY_CARE_PROVIDER_SITE_OTHER): Payer: BC Managed Care – PPO | Admitting: Family Medicine

## 2023-02-16 ENCOUNTER — Encounter: Payer: Self-pay | Admitting: Family Medicine

## 2023-02-16 ENCOUNTER — Ambulatory Visit
Admission: RE | Admit: 2023-02-16 | Discharge: 2023-02-16 | Disposition: A | Discharge status: Home / Self Care | Payer: BC Managed Care – PPO | Attending: Family Medicine | Admitting: Family Medicine

## 2023-02-16 ENCOUNTER — Ambulatory Visit
Admission: RE | Admit: 2023-02-16 | Discharge: 2023-02-16 | Disposition: A | Discharge status: Home / Self Care | Payer: BC Managed Care – PPO | Source: Ambulatory Visit | Attending: Family Medicine | Admitting: Family Medicine

## 2023-02-16 VITALS — BP 127/96 | HR 72 | Ht 74.0 in | Wt 265.4 lb

## 2023-02-16 DIAGNOSIS — L02511 Cutaneous abscess of right hand: Secondary | ICD-10-CM | POA: Insufficient documentation

## 2023-02-16 DIAGNOSIS — E6609 Other obesity due to excess calories: Secondary | ICD-10-CM | POA: Diagnosis not present

## 2023-02-16 DIAGNOSIS — Z6834 Body mass index (BMI) 34.0-34.9, adult: Secondary | ICD-10-CM

## 2023-02-16 DIAGNOSIS — I10 Essential (primary) hypertension: Secondary | ICD-10-CM | POA: Diagnosis not present

## 2023-02-16 DIAGNOSIS — M7989 Other specified soft tissue disorders: Secondary | ICD-10-CM | POA: Diagnosis not present

## 2023-02-16 DIAGNOSIS — F909 Attention-deficit hyperactivity disorder, unspecified type: Secondary | ICD-10-CM | POA: Diagnosis not present

## 2023-02-16 HISTORY — DX: Cutaneous abscess of right hand: L02.511

## 2023-02-16 MED ORDER — HYDROCHLOROTHIAZIDE 12.5 MG PO TABS: 12.5000 mg | ORAL_TABLET | Freq: Every day | ORAL | 1 refills | Status: DC

## 2023-02-16 MED ORDER — AMPHETAMINE-DEXTROAMPHETAMINE 20 MG PO TABS: 20.0000 mg | ORAL_TABLET | Freq: Two times a day (BID) | ORAL | 0 refills | Status: DC

## 2023-02-16 MED ORDER — SULFAMETHOXAZOLE-TRIMETHOPRIM 800-160 MG PO TABS: 1.0000 | ORAL_TABLET | Freq: Two times a day (BID) | ORAL | 0 refills | Status: DC

## 2023-02-16 NOTE — Assessment & Plan Note (Signed)
Chronic, improved Body mass index is 34.08 kg/m. Discussed importance of healthy weight management Discussed diet and exercise

## 2023-02-16 NOTE — Assessment & Plan Note (Signed)
Chronic, stable Request for refills PDMP reviewed; denies concern for palpitations, anxiety, difficulty sleeping, anorexia 1 month refills sent at this time with elevated BP Encourage f/u with PCP

## 2023-02-16 NOTE — Assessment & Plan Note (Signed)
Chronic, borderline Goal of <139/<89 without diagnosis of DM  Repeat CMP and CBC as well as TSH Patient has been off of HCTZ 12.5 mg; encouraged to restart and f/u with PCP

## 2023-02-16 NOTE — Assessment & Plan Note (Signed)
Acute, ongoing fluid filled pocket with occasional redness, tenderness, hardness x 'months' without known mechanism of trauma or injury Denies fevers or chills Recommend Xray, Abx and plan for I/D with surgery team

## 2023-02-16 NOTE — Progress Notes (Signed)
I,Joshua Bartlett,acting as a Neurosurgeon for Joshua Kindle, FNP.,have documented all relevant documentation on the behalf of Joshua Kindle, FNP,as directed by  Joshua Kindle, FNP while in the presence of Joshua Kindle, FNP.   Established patient visit   Patient: Joshua Bartlett   DOB: 11-27-1980   42 y.o. Male  MRN: [id number] Visit Date: 02/16/2023  Today's healthcare provider: Jacky Kindle, FNP  Introduced to nurse practitioner role and practice setting.  All questions answered.  Discussed provider/patient relationship and expectations.  Subjective    HPI  Patient reports his middle finger on his right hand has welling, red, hot to the touch. Reports he felt a hard spot for a few months and on different occasions it would swell but go away after a day. Reports the left side of his finger is swollen with some discoloration along with the center of his finger feels as if there is something embedded under the skin.   Medications: Outpatient Medications Prior to Visit  Medication Sig   HUMIRA PEN 40 MG/0.4ML PNKT    [DISCONTINUED] amphetamine-dextroamphetamine (ADDERALL) 20 MG tablet Take 1 tablet (20 mg total) by mouth 2 (two) times daily. Need appt before next refill   [DISCONTINUED] amphetamine-dextroamphetamine (ADDERALL) 20 MG tablet Take 1 tablet (20 mg total) by mouth 2 (two) times daily.   [DISCONTINUED] amphetamine-dextroamphetamine (ADDERALL) 30 MG tablet Take 1 tablet by mouth daily.   [DISCONTINUED] cyclobenzaprine (FLEXERIL) 5 MG tablet Take 1 tablet (5 mg total) by mouth 3 (three) times daily as needed for muscle spasms.   [DISCONTINUED] hydrochlorothiazide (HYDRODIURIL) 12.5 MG tablet Take 1 tablet (12.5 mg total) by mouth daily.   [DISCONTINUED] meloxicam (MOBIC) 15 MG tablet TAKE 1 TABLET(15 MG) BY MOUTH DAILY   No facility-administered medications prior to visit.    Review of Systems  Last CBC Lab Results  Component Value Date   WBC 8.4 03/14/2018   HGB 13.7  03/14/2018   HCT 40.7 03/14/2018   MCV 93 03/14/2018   MCH 31.4 03/14/2018   RDW 12.8 03/14/2018   PLT 304 03/14/2018   Last metabolic panel Lab Results  Component Value Date   GLUCOSE 81 05/28/2021   NA 138 05/28/2021   K 4.8 05/28/2021   CL 101 05/28/2021   CO2 24 05/28/2021   BUN 17 05/28/2021   CREATININE 0.98 05/28/2021   EGFR 102 05/28/2021   CALCIUM 9.4 05/28/2021   PROT 7.1 03/14/2018   ALBUMIN 4.3 03/14/2018   LABGLOB 2.8 03/14/2018   AGRATIO 1.5 03/14/2018   BILITOT 0.3 03/14/2018   ALKPHOS 60 03/14/2018   AST 24 03/14/2018   ALT 20 03/14/2018   ANIONGAP 13 03/19/2014   Last lipids Lab Results  Component Value Date   CHOL 197 03/14/2018   HDL 46 03/14/2018   LDLCALC 129 (H) 03/14/2018   TRIG 110 03/14/2018   CHOLHDL 4.3 03/14/2018     Objective    BP (!) 127/96 (BP Location: Left Arm, Patient Position: Sitting, Cuff Size: Normal)   Pulse 72   Ht 6\' 2"  (1.88 m)   Wt 265 lb 6.4 oz (120.4 kg)   SpO2 99%   BMI 34.08 kg/m   BP Readings from Last 3 Encounters:  02/16/23 (!) 127/96  10/29/21 132/89  07/23/21 (!) 135/92   Wt Readings from Last 3 Encounters:  02/16/23 265 lb 6.4 oz (120.4 kg)  10/29/21 279 lb 1.6 oz (126.6 kg)  07/23/21 276 lb 12.8 oz (125.6  kg)   SpO2 Readings from Last 3 Encounters:  02/16/23 99%  07/23/21 98%  04/24/21 100%   Physical Exam Vitals and nursing note reviewed.  Constitutional:      Appearance: Normal appearance. He is obese.  HENT:     Head: Normocephalic and atraumatic.  Cardiovascular:     Rate and Rhythm: Normal rate and regular rhythm.     Pulses: Normal pulses.     Heart sounds: Normal heart sounds.  Pulmonary:     Effort: Pulmonary effort is normal.     Breath sounds: Normal breath sounds.  Musculoskeletal:        General: Normal range of motion.     Cervical back: Normal range of motion.  Skin:    General: Skin is warm and dry.     Capillary Refill: Capillary refill takes less than 2 seconds.      Findings: Lesion present.       Neurological:     General: No focal deficit present.     Mental Status: He is alert and oriented to person, place, and time. Mental status is at baseline.     No results found for any visits on 02/16/23.  Assessment & Plan     Problem List Items Addressed This Visit       Cardiovascular and Mediastinum   Primary hypertension    Chronic, borderline Goal of <139/<89 without diagnosis of DM  Repeat CMP and CBC as well as TSH Patient has been off of HCTZ 12.5 mg; encouraged to restart and f/u with PCP      Relevant Medications   hydrochlorothiazide (HYDRODIURIL) 12.5 MG tablet   Other Relevant Orders   Comprehensive Metabolic Panel (CMET)   Lipid panel     Other   Abscess of right middle finger - Primary    Acute, ongoing fluid filled pocket with occasional redness, tenderness, hardness x 'months' without known mechanism of trauma or injury Denies fevers or chills Recommend Xray, Abx and plan for I/D with surgery team       Relevant Medications   sulfamethoxazole-trimethoprim (BACTRIM DS) 800-160 MG tablet   Other Relevant Orders   DG Hand Complete Right   Ambulatory referral to General Surgery   Adult ADHD    Chronic, stable Request for refills PDMP reviewed; denies concern for palpitations, anxiety, difficulty sleeping, anorexia 1 month refills sent at this time with elevated BP Encourage f/u with PCP       Obesity    Chronic, improved Body mass index is 34.08 kg/m. Discussed importance of healthy weight management Discussed diet and exercise       Relevant Medications   amphetamine-dextroamphetamine (ADDERALL) 20 MG tablet   Other Relevant Orders   CBC   Comprehensive Metabolic Panel (CMET)   Lipid panel   Hemoglobin A1c   TSH + free T4   Return in about 4 weeks (around 03/16/2023) for HTN management, ADHD- with PCP, blood pressure check.     Leilani Merl, FNP, have reviewed all documentation for this visit. The  documentation on 02/16/23 for the exam, diagnosis, procedures, and orders are all accurate and complete.  Joshua Kindle, FNP  [facility] [phone] (phone) [phone] (fax)  [facility]

## 2023-02-17 ENCOUNTER — Other Ambulatory Visit: Payer: Self-pay | Admitting: Family Medicine

## 2023-02-17 ENCOUNTER — Telehealth: Payer: Self-pay | Admitting: Family Medicine

## 2023-02-17 DIAGNOSIS — L02511 Cutaneous abscess of right hand: Secondary | ICD-10-CM | POA: Diagnosis not present

## 2023-02-17 LAB — CBC
Hematocrit: 40.8 % (ref 37.5–51.0)
Hemoglobin: 13.6 g/dL (ref 13.0–17.7)
MCH: 31.2 pg (ref 26.6–33.0)
MCHC: 33.3 g/dL (ref 31.5–35.7)
MCV: 94 fL (ref 79–97)
Platelets: 292 10*3/uL (ref 150–450)
RBC: 4.36 x10E6/uL (ref 4.14–5.80)
RDW: 11.9 % (ref 11.6–15.4)
WBC: 7.1 10*3/uL (ref 3.4–10.8)

## 2023-02-17 LAB — COMPREHENSIVE METABOLIC PANEL
ALT: 27 IU/L (ref 0–44)
AST: 31 IU/L (ref 0–40)
Albumin/Globulin Ratio: 1.6 (ref 1.2–2.2)
Albumin: 4.2 g/dL (ref 4.1–5.1)
Alkaline Phosphatase: 60 IU/L (ref 44–121)
BUN/Creatinine Ratio: 8 — ABNORMAL LOW (ref 9–20)
BUN: 8 mg/dL (ref 6–20)
Bilirubin Total: 0.3 mg/dL (ref 0.0–1.2)
CO2: 23 mmol/L (ref 20–29)
Calcium: 9.5 mg/dL (ref 8.7–10.2)
Chloride: 103 mmol/L (ref 96–106)
Creatinine, Ser: 0.96 mg/dL (ref 0.76–1.27)
Globulin, Total: 2.6 g/dL (ref 1.5–4.5)
Glucose: 91 mg/dL (ref 70–99)
Potassium: 4.8 mmol/L (ref 3.5–5.2)
Sodium: 140 mmol/L (ref 134–144)
Total Protein: 6.8 g/dL (ref 6.0–8.5)
eGFR: 103 mL/min/{1.73_m2} (ref >59)

## 2023-02-17 LAB — LIPID PANEL
Chol/HDL Ratio: 3.2 ratio (ref 0.0–5.0)
Cholesterol, Total: 191 mg/dL (ref 100–199)
HDL: 59 mg/dL (ref >39)
LDL Chol Calc (NIH): 121 mg/dL — ABNORMAL HIGH (ref 0–99)
Triglycerides: 60 mg/dL (ref 0–149)
VLDL Cholesterol Cal: 11 mg/dL (ref 5–40)

## 2023-02-17 LAB — TSH+FREE T4
Free T4: 0.94 ng/dL (ref 0.82–1.77)
TSH: 1.71 u[IU]/mL (ref 0.450–4.500)

## 2023-02-17 LAB — HEMOGLOBIN A1C
Est. average glucose Bld gHb Est-mCnc: 105 mg/dL
Hgb A1c MFr Bld: 5.3 % (ref 4.8–5.6)

## 2023-02-17 NOTE — Progress Notes (Signed)
Cholesterol shows elevated bad/LDL cholesterol; otherwise stable. I continue to recommend diet low in saturated fat and regular exercise - 30 min at least 5 times per week  All other labs are normal and stable.

## 2023-02-17 NOTE — Telephone Encounter (Signed)
Pt. Given lab results and instructions, verbalizes understanding. 

## 2023-02-23 NOTE — Progress Notes (Signed)
Xray indicates soft tissue edema as well as concern for foreign body in 4th finger. Continue to seek specialist care as recommended at OV.

## 2023-03-04 DIAGNOSIS — L02511 Cutaneous abscess of right hand: Secondary | ICD-10-CM | POA: Diagnosis not present

## 2023-03-09 ENCOUNTER — Ambulatory Visit: Payer: BC Managed Care – PPO | Admitting: Dermatology

## 2023-03-09 VITALS — BP 127/87

## 2023-03-09 DIAGNOSIS — B078 Other viral warts: Secondary | ICD-10-CM | POA: Diagnosis not present

## 2023-03-09 DIAGNOSIS — L700 Acne vulgaris: Secondary | ICD-10-CM | POA: Diagnosis not present

## 2023-03-09 MED ORDER — ADAPALENE 0.3 % EX GEL: CUTANEOUS | 5 refills | Status: DC

## 2023-03-09 NOTE — Progress Notes (Signed)
Follow-Up Visit   Subjective  Joshua Bartlett is a 42 y.o. male who presents for the following: check face, large pores. He would like a good skin regimen. He uses Charcoal cleanser and Neutrogena Scrub. Patient has oily skin. He also has a couple of spots on his palms to check. He works in [employer] and works with his hands a lot.   Patient originally had appointment for swollen finger due to splinter. He went to another office and had area cut out.   The following portions of the chart were reviewed this encounter and updated as appropriate: medications, allergies, medical history  Review of Systems:  No other skin or systemic complaints except as noted in HPI or Assessment and Plan.  Objective  Well appearing patient in no apparent distress; mood and affect are within normal limits.  A focused examination was performed of the following areas: Face  Relevant physical exam findings are noted in the Assessment and Plan.    Assessment & Plan   COMEDONAL ACNE  Exam: few scattered open comedones of the face.  Large pores mid face  Chronic and persistent condition with duration or expected duration over one year. Condition is symptomatic/ bothersome to patient. Not currently at goal.   Treatment Plan: Start adapalene 0.3% gel Apply a pea-sized amount to face every night as tolerated dsp 45g 5Rf.  Samples of Neutrogena Hydrating Cleansing Gel, HydroBoost, Water Gel; Cetaphil Oil Removing Wash and Moisturizer, Cetaphil BP Wash;  CeraVe AM/PM, Foaming Wash.  Topical retinoid medications like tretinoin/Retin-A, adapalene/Differin, tazarotene/Fabior, and Epiduo/Epiduo Forte can cause dryness and irritation when first started. Only apply a pea-sized amount to the entire affected area. Avoid applying it around the eyes, edges of mouth and creases at the nose. If you experience irritation, use a good moisturizer first and/or apply the medicine less often. If you are doing well with the  medicine, you can increase how often you use it until you are applying every night. Be careful with sun protection while using this medication as it can make you sensitive to the sun. This medicine should not be used by pregnant women.   Benzoyl peroxide can cause dryness and irritation of the skin. It can also bleach fabric. When used together with Aczone (dapsone) cream, it can stain the skin orange.  WART Exam: Firm flesh papules on R thenar palm, L index base, L thenar palm  Counseling Discussed viral / HPV (Human Papilloma Virus) etiology and risk of spread /infectivity to other areas of body as well as to other people.  Multiple treatments and methods may be required to clear warts and it is possible treatment may not be successful.  Treatment risks include discoloration; scarring and there is still potential for wart recurrence.  Treatment Plan: Destruction Procedure Note Destruction method: cryotherapy   Informed consent: discussed and consent obtained   Lesion destroyed using liquid nitrogen: Yes   Outcome: patient tolerated procedure well with no complications   Post-procedure details: wound care instructions given   Locations: R thenar palm x 1, L thenar palm x 1, L index base x 1 # of Lesions Treated: 3  Prior to procedure, discussed risks of blister formation, small wound, skin dyspigmentation, or rare scar following cryotherapy. Recommend Vaseline ointment to treated areas while healing.  Recommend using Curad Mediplast pads. Cut to fit wart or callus. Cover with Elastoplast waterproof tape or any waterproof band-aid. Change every 3 to 4 days, or sooner if necessary.  Treatment may require  several months of regular use before results are seen.   Return if symptoms worsen or fail to improve.  ICherlyn Labella, CMA, am acting as scribe for Willeen Niece, MD .   Documentation: I have reviewed the above documentation for accuracy and completeness, and I agree with the  above.  Willeen Niece, MD

## 2023-03-09 NOTE — Patient Instructions (Addendum)
Start Adapalene 0.3% Gel Apply a pea-sized amount to face every night as tolerated. May apply moisturizer first.   Topical retinoid medications like tretinoin/Retin-A, adapalene/Differin, tazarotene/Fabior, and Epiduo/Epiduo Forte can cause dryness and irritation when first started. Only apply a pea-sized amount to the entire affected area. Avoid applying it around the eyes, edges of mouth and creases at the nose. If you experience irritation, use a good moisturizer first and/or apply the medicine less often. If you are doing well with the medicine, you can increase how often you use it until you are applying every night. Be careful with sun protection while using this medication as it can make you sensitive to the sun. This medicine should not be used by pregnant women.   Cryotherapy Aftercare  Wash gently with soap and water everyday.   Apply Vaseline and Band-Aid daily until healed.   Recommend using Curad Mediplast pads. Cut to fit wart or callus. Cover with Elastoplast waterproof tape or any waterproof band-aid. Change every 3 to 4 days, or sooner if necessary.  Treatment may require several months of regular use before results are seen.   Viral Warts & Molluscum Contagiosum  Viral warts and molluscum contagiosum are growths of the skin caused by viral infection of the skin. If you have been given the diagnosis of viral warts or molluscum contagiosum there are a few things that you must understand about your condition:  There is no guaranteed treatment method available for this condition. Multiple treatments may be required, The treatments may be time consuming and require multiple visits to the dermatology office. The treatment may be expensive. You will be charged each time you come into the office to have the spots treated. The treated areas may develop new lesions further complicating treatment. The treated areas may leave a scar. There is no guarantee that even after multiple  treatments that the spots will be successfully treated. These are caused by a viral infection and can be spread to other areas of the skin and to other people by direct contact. Therefore, new spots may occur.  Due to recent changes in healthcare laws, you may see results of your pathology and/or laboratory studies on MyChart before the doctors have had a chance to review them. We understand that in some cases there may be results that are confusing or concerning to you. Please understand that not all results are received at the same time and often the doctors may need to interpret multiple results in order to provide you with the best plan of care or course of treatment. Therefore, we ask that you please give [place] 2 business days to thoroughly review all your results before contacting the office for clarification. Should we see a critical lab result, you will be contacted sooner.   If You Need Anything After Your Visit  If you have any questions or concerns for your doctor, please call our main line at [phone] and press option 4 to reach your doctor's medical assistant. If no one answers, please leave a voicemail as directed and we will return your call as soon as possible. Messages left after 4 pm will be answered the following business day.   You may also send [place] a message via MyChart. We typically respond to MyChart messages within 1-2 business days.  For prescription refills, please ask your pharmacy to contact our office. Our fax number is [phone].  If you have an urgent issue when the clinic is closed that cannot wait until the next  business day, you can page your doctor at the number below.    Please note that while we do our best to be available for urgent issues outside of office hours, we are not available 24/7.   If you have an urgent issue and are unable to reach [place], you may choose to seek medical care at your doctor's office, retail clinic, urgent care center, or emergency  room.  If you have a medical emergency, please immediately call 911 or go to the emergency department.  Pager Numbers  - Dr. Gwen Pounds: [phone]  - Dr. Neale Burly: [phone]  - Dr. Roseanne Reno: [phone]  In the event of inclement weather, please call our main line at [phone] for an update on the status of any delays or closures.  Dermatology Medication Tips: Please keep the boxes that topical medications come in in order to help keep track of the instructions about where and how to use these. Pharmacies typically print the medication instructions only on the boxes and not directly on the medication tubes.   If your medication is too expensive, please contact our office at [phone] option 4 or send [place] a message through MyChart.   We are unable to tell what your co-pay for medications will be in advance as this is different depending on your insurance coverage. However, we may be able to find a substitute medication at lower cost or fill out paperwork to get insurance to cover a needed medication.   If a prior authorization is required to get your medication covered by your insurance company, please allow [place] 1-2 business days to complete this process.  Drug prices often vary depending on where the prescription is filled and some pharmacies may offer cheaper prices.  The website www.goodrx.com contains coupons for medications through different pharmacies. The prices here do not account for what the cost may be with help from insurance (it may be cheaper with your insurance), but the website can give you the price if you did not use any insurance.  - You can print the associated coupon and take it with your prescription to the pharmacy.  - You may also stop by our office during regular business hours and pick up a GoodRx coupon card.  - If you need your prescription sent electronically to a different pharmacy, notify our office through [facility] or by phone at [phone]  option 4.     Si Usted Necesita Algo Despus de Su Visita  Tambin puede enviarnos un mensaje a travs de Clinical cytogeneticist. Por lo general respondemos a los mensajes de MyChart en el transcurso de 1 a 2 das hbiles.  Para renovar recetas, por favor pida a su farmacia que se ponga en contacto con nuestra oficina. Annie Sable de fax es [place] [phone].  Si tiene un asunto urgente cuando la clnica est cerrada y que no puede esperar hasta el siguiente da hbil, puede llamar/localizar a su doctor(a) al nmero que aparece a continuacin.   Por favor, tenga en cuenta que aunque hacemos todo lo posible para estar disponibles para asuntos urgentes fuera del horario de [place], no estamos disponibles las 24 horas del da, los 7 [address]  [address] de la [place].   Si tiene un problema urgente y no puede comunicarse con nosotros, puede optar por buscar atencin mdica  en el consultorio de su doctor(a), en una clnica privada, en un centro de atencin urgente o en una sala de emergencias.  Si tiene una emergencia mdica, por favor llame inmediatamente al 911 o  vaya a la sala de emergencias.  Nmeros de bper  - Dr. Gwen Pounds: [phone]  - Dra. Moye: [phone]  - Dra. Roseanne Reno: [phone]  En caso de inclemencias del [place], por favor llame a Lacy Duverney principal al [phone] para una actualizacin sobre el [place] de cualquier retraso o cierre.  Consejos para la medicacin en dermatologa: Por favor, guarde las cajas en las que vienen los medicamentos de uso tpico para ayudarle a seguir las instrucciones sobre dnde y cmo usarlos. Las farmacias generalmente imprimen las instrucciones del medicamento slo en las cajas y no directamente en los tubos del [place].   Si su medicamento es muy caro, por favor, pngase en contacto con Rolm Gala llamando al [phone] y presione la opcin 4 o envenos un mensaje a travs de Clinical cytogeneticist.   No podemos decirle cul ser su copago por los medicamentos  por adelantado ya que esto es diferente dependiendo de la cobertura de su seguro. Sin embargo, es posible que podamos encontrar un medicamento sustituto a Audiological scientist un formulario para que el seguro cubra el medicamento que se considera necesario.   Si se requiere una autorizacin previa para que su compaa de seguros [place] su medicamento, por favor permtanos de 1 a 2 das hbiles para completar [address]  Los precios de los medicamentos varan con frecuencia dependiendo del Environmental consultant de dnde se surte la receta y alguna farmacias pueden ofrecer precios ms baratos.  El sitio web www.goodrx.com tiene cupones para medicamentos de Health and safety inspector. Los precios aqu no tienen en cuenta lo que podra costar con la ayuda del seguro (puede ser ms barato con su seguro), pero el sitio web puede darle el precio si no utiliz Tourist information centre manager.  - Puede imprimir el cupn correspondiente y llevarlo con su receta a la farmacia.  - Tambin puede pasar por nuestra oficina durante el horario de atencin regular y Education officer, museum una tarjeta de cupones de GoodRx.  - Si necesita que su receta se enve electrnicamente a una farmacia diferente, informe a nuestra oficina a travs de MyChart de [place] o por telfono llamando al [phone] y presione la opcin 4.

## 2023-04-04 DIAGNOSIS — M549 Dorsalgia, unspecified: Secondary | ICD-10-CM | POA: Diagnosis not present

## 2023-04-04 DIAGNOSIS — M457 Ankylosing spondylitis of lumbosacral region: Secondary | ICD-10-CM | POA: Diagnosis not present

## 2023-06-08 ENCOUNTER — Other Ambulatory Visit: Payer: Self-pay | Admitting: Family Medicine

## 2023-06-08 NOTE — Telephone Encounter (Signed)
Medication Refill - Medication: amphetamine-dextroamphetamine (ADDERALL) 20 MG tablet   Has the patient contacted their pharmacy? No.  Preferred Pharmacy (with phone number or street name):  [id number] - [place], [place] - [address] Phone: [phone]  Fax: [phone]     Has the patient been seen for an appointment in the last year OR does the patient have an upcoming appointment? Yes.    Agent: Please be advised that RX refills may take up to 3 business days. We ask that you follow-up with your pharmacy.  

## 2023-06-09 NOTE — Telephone Encounter (Signed)
Requested medication (s) are due for refill today:   Provider to review  Requested medication (s) are on the active medication list:   Yes  Future visit scheduled:   No    Last ordered: 02/16/2023 #60, 0 refills  Non delegated refill   Requested Prescriptions  Pending Prescriptions Disp Refills   amphetamine-dextroamphetamine (ADDERALL) 20 MG tablet 60 tablet 0    Sig: Take 1 tablet (20 mg total) by mouth 2 (two) times daily.     Not Delegated - Psychiatry:  Stimulants/ADHD Failed - 06/08/2023  9:07 AM      Failed - This refill cannot be delegated      Failed - Urine Drug Screen completed in last 360 days      Passed - Last BP in normal range    BP Readings from Last 1 Encounters:  03/09/23 127/87         Passed - Last Heart Rate in normal range    Pulse Readings from Last 1 Encounters:  02/16/23 72         Passed - Valid encounter within last 6 months    Recent Outpatient Visits           3 months ago Abscess of right middle finger   [facility] Jacky Kindle, FNP   1 year ago Adult ADHD   [facility] Caro Laroche, DO   1 year ago Primary hypertension   [facility] [place], Marzella Schlein, MD   1 year ago Encounter for annual physical exam   [facility] [place], Marzella Schlein, MD   2 years ago Primary hypertension   [facility] [place], Marzella Schlein, MD

## 2023-06-17 DIAGNOSIS — Z23 Encounter for immunization: Secondary | ICD-10-CM | POA: Diagnosis not present

## 2023-07-05 ENCOUNTER — Encounter: Payer: Self-pay | Admitting: Family Medicine

## 2023-07-05 ENCOUNTER — Ambulatory Visit: Payer: BC Managed Care – PPO | Admitting: Family Medicine

## 2023-07-05 VITALS — BP 122/102 | HR 69 | Temp 97.9°F | Ht 74.0 in | Wt 262.0 lb

## 2023-07-05 DIAGNOSIS — F909 Attention-deficit hyperactivity disorder, unspecified type: Secondary | ICD-10-CM | POA: Diagnosis not present

## 2023-07-05 DIAGNOSIS — M457 Ankylosing spondylitis of lumbosacral region: Secondary | ICD-10-CM

## 2023-07-05 DIAGNOSIS — Z0000 Encounter for general adult medical examination without abnormal findings: Secondary | ICD-10-CM

## 2023-07-05 DIAGNOSIS — I10 Essential (primary) hypertension: Secondary | ICD-10-CM

## 2023-07-05 DIAGNOSIS — E66811 Obesity, class 1: Secondary | ICD-10-CM | POA: Diagnosis not present

## 2023-07-05 DIAGNOSIS — E6609 Other obesity due to excess calories: Secondary | ICD-10-CM

## 2023-07-05 DIAGNOSIS — Z87891 Personal history of nicotine dependence: Secondary | ICD-10-CM | POA: Diagnosis not present

## 2023-07-05 DIAGNOSIS — Z6833 Body mass index (BMI) 33.0-33.9, adult: Secondary | ICD-10-CM

## 2023-07-05 MED ORDER — AMPHETAMINE-DEXTROAMPHETAMINE 20 MG PO TABS: 20.0000 mg | ORAL_TABLET | Freq: Two times a day (BID) | ORAL | 0 refills | Status: DC

## 2023-07-05 MED ORDER — HYDROCHLOROTHIAZIDE 12.5 MG PO TABS: 12.5000 mg | ORAL_TABLET | Freq: Every day | ORAL | 3 refills | Status: DC

## 2023-07-05 NOTE — Assessment & Plan Note (Signed)
Patient on Adderall and has been managing well. -Continue Adderall as prescribed.

## 2023-07-05 NOTE — Assessment & Plan Note (Signed)
Discussed importance of healthy weight management Discussed diet and exercise  

## 2023-07-05 NOTE — Assessment & Plan Note (Signed)
Patient has stopped Humira and meloxicam due to personal preference and side effects. Discussed the importance of Humira in preventing disease progression. -Encourage discussion with Dr. Allena Katz regarding the benefits of Humira in preventing disease progression. -Use meloxicam as needed for pain control.

## 2023-07-05 NOTE — Progress Notes (Signed)
Complete physical exam  Patient: Joshua Bartlett   DOB: Mar 01, 1981   42 y.o. Male  MRN: [id number]  Subjective:    Chief Complaint  Patient presents with   ADHD    Patient was last seen about 1 year ago.  He reports good compliance and tolerance of the medicine and feels his dose if good.    Carrie OLUWATIMILEHIN BALFOUR is a 42 y.o. male who presents today for a complete physical exam. He reports consuming a general diet.  He generally feels well. He reports sleeping well. He does have additional problems to discuss today.   Discussed the use of AI scribe software for clinical note transcription with the patient, who gave verbal consent to proceed.  History of Present Illness   The patient, with a history of ADHD, high blood pressure, and ankylosing spondylitis, presents with concerns about potential cancer screenings due to a family history of cancer. The patient has been struggling with medication adherence due to concerns about taking multiple medications and potential side effects. The patient has stopped taking Humira and meloxicam, and has been inconsistent with blood pressure medication. The patient is currently taking Adderall for ADHD. The patient has also made dietary changes, such as eliminating red meat, which she reports has helped manage her symptoms. The patient has a history of smoking but quit in her early thirties. The patient has also received a flu shot recently.        Most recent fall risk assessment:    07/05/2023    8:19 AM  Fall Risk   Falls in the past year? 0  Number falls in past yr: 0  Injury with Fall? 0     Most recent depression screenings:    07/05/2023    8:19 AM 02/16/2023   11:10 AM  PHQ 2/9 Scores  PHQ - 2 Score 1 1  PHQ- 9 Score 7 2        Patient Care Team: Erasmo Downer, MD as PCP - General (Family Medicine) Beryle Flock, Marzella Schlein, MD (Family Medicine)   Outpatient Medications Prior to Visit  Medication Sig   [DISCONTINUED]  amphetamine-dextroamphetamine (ADDERALL) 20 MG tablet Take 1 tablet (20 mg total) by mouth 2 (two) times daily.   [DISCONTINUED] Adapalene (DIFFERIN) 0.3 % gel Apply a pea-sized amount to face every night as tolerated.   [DISCONTINUED] HUMIRA PEN 40 MG/0.4ML PNKT  (Patient not taking: Reported on 03/09/2023)   [DISCONTINUED] hydrochlorothiazide (HYDRODIURIL) 12.5 MG tablet Take 1 tablet (12.5 mg total) by mouth daily. (Patient not taking: Reported on 03/09/2023)   [DISCONTINUED] sulfamethoxazole-trimethoprim (BACTRIM DS) 800-160 MG tablet Take 1 tablet by mouth 2 (two) times daily. (Patient not taking: Reported on 03/09/2023)   No facility-administered medications prior to visit.    ROS     Objective:     BP (!) 122/102 (BP Location: Left Arm, Patient Position: Sitting, Cuff Size: Large)   Pulse 69   Temp 97.9 F (36.6 C) (Oral)   Ht 6\' 2"  (1.88 m)   Wt 262 lb (118.8 kg)   SpO2 100%   BMI 33.64 kg/m    Physical Exam Vitals reviewed.  Constitutional:      General: He is not in acute distress.    Appearance: Normal appearance. He is well-developed. He is not diaphoretic.  HENT:     Head: Normocephalic and atraumatic.     Right Ear: Tympanic membrane, ear canal and external ear normal.     Left Ear: Tympanic membrane,  ear canal and external ear normal.     Nose: Nose normal.     Mouth/Throat:     Mouth: Mucous membranes are moist.     Pharynx: Oropharynx is clear. No oropharyngeal exudate.  Eyes:     General: No scleral icterus.    Conjunctiva/sclera: Conjunctivae normal.     Pupils: Pupils are equal, round, and reactive to light.  Neck:     Thyroid: No thyromegaly.  Cardiovascular:     Rate and Rhythm: Normal rate and regular rhythm.     Heart sounds: Normal heart sounds. No murmur heard. Pulmonary:     Effort: Pulmonary effort is normal. No respiratory distress.     Breath sounds: Normal breath sounds. No wheezing or rales.  Abdominal:     General: There is no  distension.     Palpations: Abdomen is soft.     Tenderness: There is no abdominal tenderness.  Musculoskeletal:        General: No deformity.     Cervical back: Neck supple.     Right lower leg: No edema.     Left lower leg: No edema.  Lymphadenopathy:     Cervical: No cervical adenopathy.  Skin:    General: Skin is warm and dry.     Findings: No rash.  Neurological:     Mental Status: He is alert and oriented to person, place, and time. Mental status is at baseline.     Gait: Gait normal.  Psychiatric:        Mood and Affect: Mood normal.        Behavior: Behavior normal.        Thought Content: Thought content normal.      No results found for any visits on 07/05/23.     Assessment & Plan:    Routine Health Maintenance and Physical Exam  Immunization History  Administered Date(s) Administered   DTaP 01/20/1984, 04/06/1984, 03/01/1985, 03/21/1986, 06/30/1989   IPV 01/20/1984, 04/06/1984, 03/01/1985, 03/21/1986, 06/30/1989   Influenza,inj,Quad PF,6-35 Mos 06/30/2021   Influenza-Unspecified 06/15/2023   MMR 03/01/1985   Tdap 03/10/2018    Health Maintenance  Topic Date Due   COVID-19 Vaccine (1) Never done   DTaP/Tdap/Td (7 - Td or Tdap) 03/10/2028   INFLUENZA VACCINE  Completed   Hepatitis C Screening  Completed   HIV Screening  Completed   HPV VACCINES  Aged Out    Discussed health benefits of physical activity, and encouraged him to engage in regular exercise appropriate for his age and condition.  Problem List Items Addressed This Visit       Cardiovascular and Mediastinum   Primary hypertension    Elevated blood pressure noted during visit. Patient has been inconsistent with hydrochlorothiazide use. Discussed the importance of consistent medication use and the long-term benefits of blood pressure control. -Resume hydrochlorothiazide and incorporate into daily routine. -Check blood pressure and kidney function in 1 month.      Relevant Medications    hydrochlorothiazide (HYDRODIURIL) 12.5 MG tablet     Musculoskeletal and Integument   Ankylosing spondylitis (HCC)    Patient has stopped Humira and meloxicam due to personal preference and side effects. Discussed the importance of Humira in preventing disease progression. -Encourage discussion with Dr. Allena Katz regarding the benefits of Humira in preventing disease progression. -Use meloxicam as needed for pain control.        Other   Adult ADHD    Patient on Adderall and has been managing well. -Continue Adderall as  prescribed.      History of tobacco use disorder   Obesity    Discussed importance of healthy weight management Discussed diet and exercise       Relevant Medications   amphetamine-dextroamphetamine (ADDERALL) 20 MG tablet   amphetamine-dextroamphetamine (ADDERALL) 20 MG tablet   amphetamine-dextroamphetamine (ADDERALL) 20 MG tablet   Other Visit Diagnoses     Encounter for annual physical exam    -  Primary           Cancer Screening Patient has concerns due to family history of cancer. Discussed current guidelines for colon and lung cancer screening. -Plan for colon cancer screening at age 60. -Discuss lung cancer screening at age 31, pending changes in guidelines.  General Health Maintenance -Flu vaccine already administered for the current season. -Plan for routine labs next month to monitor kidney function and electrolytes.       Return in about 4 weeks (around 08/02/2023) for BP f/u.     Shirlee Latch, MD

## 2023-07-05 NOTE — Assessment & Plan Note (Signed)
Elevated blood pressure noted during visit. Patient has been inconsistent with hydrochlorothiazide use. Discussed the importance of consistent medication use and the long-term benefits of blood pressure control. -Resume hydrochlorothiazide and incorporate into daily routine. -Check blood pressure and kidney function in 1 month.

## 2023-08-02 ENCOUNTER — Encounter: Payer: Self-pay | Admitting: Family Medicine

## 2023-08-02 ENCOUNTER — Ambulatory Visit: Payer: BC Managed Care – PPO | Admitting: Family Medicine

## 2023-08-02 VITALS — BP 119/79 | HR 79 | Ht 74.0 in | Wt 256.0 lb

## 2023-08-02 DIAGNOSIS — Z79899 Other long term (current) drug therapy: Secondary | ICD-10-CM | POA: Diagnosis not present

## 2023-08-02 DIAGNOSIS — I10 Essential (primary) hypertension: Secondary | ICD-10-CM | POA: Diagnosis not present

## 2023-08-02 DIAGNOSIS — M457 Ankylosing spondylitis of lumbosacral region: Secondary | ICD-10-CM

## 2023-08-02 NOTE — Progress Notes (Signed)
Established Patient Office Visit  Subjective   Patient ID: Joshua Bartlett, male    DOB: 1981/06/08  Age: 42 y.o. MRN: [id number]  No chief complaint on file.   HPI  Discussed the use of AI scribe software for clinical note transcription with the patient, who gave verbal consent to proceed.  History of Present Illness   The patient, with a history of hypertension and an unspecified condition treated with Humira, presents for a routine follow-up. He reports that his blood pressure has improved significantly, with current readings at 119/79, just below the normal range. He is currently taking 12.5mg  of hydrochlorothiazide daily and has established a routine for medication adherence.  The patient is considering restarting Humira, which he had previously discontinued. He has two doses left and is unsure about the medication's expiration and the process of restarting the regimen. He also mentions a discount program for Humira, which he found challenging to navigate.  The patient is scheduled for labs today, which may be related to his consideration of restarting Humira. He has no other concerns or questions at this time.         ROS    Objective:     BP 119/79   Pulse 79   Ht 6\' 2"  (1.88 m)   Wt 256 lb (116.1 kg)   SpO2 97%   BMI 32.87 kg/m    Physical Exam Vitals reviewed.  Constitutional:      General: He is not in acute distress.    Appearance: Normal appearance. He is not diaphoretic.  HENT:     Head: Normocephalic and atraumatic.  Eyes:     General: No scleral icterus.    Conjunctiva/sclera: Conjunctivae normal.  Cardiovascular:     Rate and Rhythm: Normal rate and regular rhythm.     Heart sounds: Normal heart sounds. No murmur heard. Pulmonary:     Effort: Pulmonary effort is normal. No respiratory distress.     Breath sounds: Normal breath sounds. No wheezing or rhonchi.  Musculoskeletal:     Cervical back: Neck supple.     Right lower leg: No edema.      Left lower leg: No edema.  Lymphadenopathy:     Cervical: No cervical adenopathy.  Skin:    General: Skin is warm and dry.     Findings: No rash.  Neurological:     Mental Status: He is alert and oriented to person, place, and time. Mental status is at baseline.  Psychiatric:        Mood and Affect: Mood normal.        Behavior: Behavior normal.      No results found for any visits on 08/02/23.    The ASCVD Risk score (Arnett DK, et al., 2019) failed to calculate for the following reasons:   The 2019 ASCVD risk score is only valid for ages 54 to 53    Assessment & Plan:   Problem List Items Addressed This Visit       Cardiovascular and Mediastinum   Primary hypertension - Primary    Hypertension is well-controlled with current medication regimen. Blood pressure reading today is 119/79 mmHg. Currently taking 12.5 mg of hydrochlorothiazide daily. No issues or concerns reported. - Continue hydrochlorothiazide 12.5 mg daily      Relevant Orders   Comprehensive metabolic panel     Musculoskeletal and Integument   Ankylosing spondylitis (HCC)   Other Visit Diagnoses     Long-term use of high-risk medication  Relevant Orders   Comprehensive metabolic panel   CBC w/Diff/Platelet            Inquires about restarting Humira. Has two doses left and is concerned about the expiration date. Advised to check with Dr. Allena Katz regarding timing and refills to ensure continuous treatment. - Message Dr. Allena Katz to discuss restarting Humira and ensure refills are set up - Check expiration date on current Humira doses  General Health Maintenance Due for routine lab work, especially if considering restarting Humira. Labs will include blood counts, kidney, and liver function tests. - Order panel of blood counts, kidney, and liver function tests - Visit the lab today for blood work  Follow-up - Schedule follow-up appointment in three months.        Return in about 3 months  (around 11/02/2023) for ADD f/u, virtual ok.    Shirlee Latch, MD

## 2023-08-02 NOTE — Assessment & Plan Note (Signed)
Hypertension is well-controlled with current medication regimen. Blood pressure reading today is 119/79 mmHg. Currently taking 12.5 mg of hydrochlorothiazide daily. No issues or concerns reported. - Continue hydrochlorothiazide 12.5 mg daily

## 2023-08-03 LAB — COMPREHENSIVE METABOLIC PANEL
ALT: 17 [IU]/L (ref 0–44)
AST: 23 [IU]/L (ref 0–40)
Albumin: 4.3 g/dL (ref 4.1–5.1)
Alkaline Phosphatase: 61 [IU]/L (ref 44–121)
BUN/Creatinine Ratio: 14 (ref 9–20)
BUN: 13 mg/dL (ref 6–20)
Bilirubin Total: 0.3 mg/dL (ref 0.0–1.2)
CO2: 25 mmol/L (ref 20–29)
Calcium: 9.5 mg/dL (ref 8.7–10.2)
Chloride: 100 mmol/L (ref 96–106)
Creatinine, Ser: 0.92 mg/dL (ref 0.76–1.27)
Globulin, Total: 2.5 g/dL (ref 1.5–4.5)
Glucose: 88 mg/dL (ref 70–99)
Potassium: 4.4 mmol/L (ref 3.5–5.2)
Sodium: 138 mmol/L (ref 134–144)
Total Protein: 6.8 g/dL (ref 6.0–8.5)
eGFR: 109 mL/min/{1.73_m2} (ref >59)

## 2023-08-03 LAB — CBC WITH DIFFERENTIAL/PLATELET
Basophils Absolute: 0.1 10*3/uL (ref 0.0–0.2)
Basos: 1 %
EOS (ABSOLUTE): 0.2 10*3/uL (ref 0.0–0.4)
Eos: 2 %
Hematocrit: 42.4 % (ref 37.5–51.0)
Hemoglobin: 13.4 g/dL (ref 13.0–17.7)
Immature Grans (Abs): 0 10*3/uL (ref 0.0–0.1)
Immature Granulocytes: 0 %
Lymphocytes Absolute: 3.2 10*3/uL — ABNORMAL HIGH (ref 0.7–3.1)
Lymphs: 40 %
MCH: 29.9 pg (ref 26.6–33.0)
MCHC: 31.6 g/dL (ref 31.5–35.7)
MCV: 95 fL (ref 79–97)
Monocytes Absolute: 0.6 10*3/uL (ref 0.1–0.9)
Monocytes: 8 %
Neutrophils Absolute: 3.8 10*3/uL (ref 1.4–7.0)
Neutrophils: 49 %
Platelets: 311 10*3/uL (ref 150–450)
RBC: 4.48 x10E6/uL (ref 4.14–5.80)
RDW: 11.7 % (ref 11.6–15.4)
WBC: 7.9 10*3/uL (ref 3.4–10.8)

## 2023-08-09 ENCOUNTER — Other Ambulatory Visit: Payer: Self-pay | Admitting: Family Medicine

## 2023-08-09 MED ORDER — AMPHETAMINE-DEXTROAMPHETAMINE 20 MG PO TABS: 20.0000 mg | ORAL_TABLET | Freq: Two times a day (BID) | ORAL | 0 refills | Status: DC

## 2023-08-09 NOTE — Telephone Encounter (Signed)
Requested medication (s) are due for refill today -no  Requested medication (s) are on the active medication list -yes  Future visit scheduled -yes  Last refill: 07/05/23- 3 Rx #60  Notes to clinic: non delegated Rx  Requested Prescriptions  Pending Prescriptions Disp Refills   amphetamine-dextroamphetamine (ADDERALL) 20 MG tablet 60 tablet 0    Sig: Take 1 tablet (20 mg total) by mouth 2 (two) times daily.     Not Delegated - Psychiatry:  Stimulants/ADHD Failed - 08/09/2023  8:41 AM      Failed - This refill cannot be delegated      Failed - Urine Drug Screen completed in last 360 days      Passed - Last BP in normal range    BP Readings from Last 1 Encounters:  08/02/23 119/79         Passed - Last Heart Rate in normal range    Pulse Readings from Last 1 Encounters:  08/02/23 79         Passed - Valid encounter within last 6 months    Recent Outpatient Visits           1 week ago Primary hypertension   [facility] [place], Marzella Schlein, MD   1 month ago Encounter for annual physical exam   [facility] [place], Marzella Schlein, MD   5 months ago Abscess of right middle finger   [facility] Jacky Kindle, FNP   1 year ago Adult ADHD   [facility] Caro Laroche, DO   1 year ago Primary hypertension   [place] [facility] [place], Marzella Schlein, MD       Future Appointments             In 2 months Bacigalupo, Marzella Schlein, MD [facility], [facility]               Requested Prescriptions  Pending Prescriptions Disp Refills   amphetamine-dextroamphetamine (ADDERALL) 20 MG tablet 60 tablet 0    Sig: Take 1 tablet (20 mg total) by mouth 2 (two) times daily.     Not Delegated - Psychiatry:  Stimulants/ADHD Failed - 08/09/2023  8:41 AM      Failed - This refill cannot be delegated      Failed - Urine Drug Screen  completed in last 360 days      Passed - Last BP in normal range    BP Readings from Last 1 Encounters:  08/02/23 119/79         Passed - Last Heart Rate in normal range    Pulse Readings from Last 1 Encounters:  08/02/23 79         Passed - Valid encounter within last 6 months    Recent Outpatient Visits           1 week ago Primary hypertension   [facility] [place], Marzella Schlein, MD   1 month ago Encounter for annual physical exam   [place] Erasmo Downer, MD   5 months ago Abscess of right middle finger   [facility] Jacky Kindle, FNP   1 year ago Adult ADHD   [facility] Caro Laroche, DO   1 year ago Primary hypertension   [facility] [place], Marzella Schlein, MD  Future Appointments             In 2 months Bacigalupo, Marzella Schlein, MD [facility]

## 2023-08-09 NOTE — Telephone Encounter (Signed)
Medication Refill -  Most Recent Primary Care Visit:  Provider: Erasmo Downer  Department: [facility]  Visit Type: OFFICE VISIT  Date: 08/02/2023  Medication: amphetamine-dextroamphetamine (ADDERALL) 20 MG tablet [908   Has the patient contacted their pharmacy?no  Is this the correct pharmacy for this prescription? Yes If no, delete pharmacy and type the correct one.  This is the patient's preferred pharmacy:  [id number] [place], [place] - [address] AT [facility] [address]  [address] [place] [address]  Phone: [phone] Fax: [phone]  Hours: Not open 24 hours    Has the prescription been filled recently? Yes  Is the patient out of the medication? No  Has the patient been seen for an appointment in the last year OR does the patient have an upcoming appointment? Yes  Can we respond through MyChart? Yes  Agent: Please be advised that Rx refills may take up to 3 business days. We ask that you follow-up with your pharmacy.

## 2023-11-03 ENCOUNTER — Telehealth: Payer: BC Managed Care – PPO | Admitting: Family Medicine

## 2023-11-03 DIAGNOSIS — Z91199 Patient's noncompliance with other medical treatment and regimen due to unspecified reason: Secondary | ICD-10-CM

## 2023-11-03 NOTE — Progress Notes (Signed)
 No show for virtual appt - sent multiple links, unable to get on the phone.

## 2023-12-02 DIAGNOSIS — M457 Ankylosing spondylitis of lumbosacral region: Secondary | ICD-10-CM | POA: Diagnosis not present

## 2023-12-02 DIAGNOSIS — M549 Dorsalgia, unspecified: Secondary | ICD-10-CM | POA: Diagnosis not present

## 2023-12-22 ENCOUNTER — Other Ambulatory Visit: Payer: Self-pay | Admitting: Family Medicine

## 2023-12-22 MED ORDER — AMPHETAMINE-DEXTROAMPHETAMINE 20 MG PO TABS: 20.0000 mg | ORAL_TABLET | Freq: Two times a day (BID) | ORAL | 0 refills | Status: DC

## 2023-12-22 NOTE — Telephone Encounter (Signed)
 Copied from CRM [id number]. Topic: Clinical - Medication Refill >> Dec 22, 2023  1:05 PM Emylou G wrote: Most Recent Primary Care Visit:  Provider: Erasmo Downer  Department: [facility]  Visit Type: MYCHART VIDEO VISIT  Date: 11/03/2023  Medication: amphetamine-dextroamphetamine (ADDERALL) 20 MG tablet    Has the patient contacted their pharmacy? Yes (Agent: If no, request that the patient contact the pharmacy for the refill. If patient does not wish to contact the pharmacy document the reason why and proceed with request.) (Agent: If yes, when and what did the pharmacy advise?) said didn't have any refills  Is this the correct pharmacy for this prescription? Yes If no, delete pharmacy and type the correct one.  This is the patient's preferred pharmacy:  [id number] [place], [place] - [address] AT [facility] [address] [place] [address] Phone: [phone] Fax: [phone]     Has the prescription been filled recently? No  Is the patient out of the medication? Yes  Has the patient been seen for an appointment in the last year OR does the patient have an upcoming appointment? Yes  Can we respond through MyChart? Yes  Agent: Please be advised that Rx refills may take up to 3 business days. We ask that you follow-up with your pharmacy.

## 2023-12-22 NOTE — Telephone Encounter (Signed)
 Requested medication (s) are due for refill today: Yes  Requested medication (s) are on the active medication list: Yes  Last refill:  07/05/23  Future visit scheduled: Yes  Notes to clinic:  Unable to refill per protocol, cannot delegate.      Requested Prescriptions  Pending Prescriptions Disp Refills   amphetamine-dextroamphetamine (ADDERALL) 20 MG tablet 60 tablet 0    Sig: Take 1 tablet (20 mg total) by mouth 2 (two) times daily.     Not Delegated - Psychiatry:  Stimulants/ADHD Failed - 12/22/2023  2:19 PM      Failed - This refill cannot be delegated      Failed - Urine Drug Screen completed in last 360 days      Passed - Last BP in normal range    BP Readings from Last 1 Encounters:  08/02/23 119/79         Passed - Last Heart Rate in normal range    Pulse Readings from Last 1 Encounters:  08/02/23 79         Passed - Valid encounter within last 6 months    Recent Outpatient Visits           1 month ago No-show for appointment   [facility] [place], Marzella Schlein, MD

## 2023-12-29 ENCOUNTER — Ambulatory Visit: Admitting: Family Medicine

## 2023-12-29 ENCOUNTER — Telehealth (INDEPENDENT_AMBULATORY_CARE_PROVIDER_SITE_OTHER): Admitting: Family Medicine

## 2023-12-29 ENCOUNTER — Encounter: Payer: Self-pay | Admitting: Family Medicine

## 2023-12-29 VITALS — BP 120/72

## 2023-12-29 DIAGNOSIS — F909 Attention-deficit hyperactivity disorder, unspecified type: Secondary | ICD-10-CM | POA: Diagnosis not present

## 2023-12-29 DIAGNOSIS — I10 Essential (primary) hypertension: Secondary | ICD-10-CM | POA: Diagnosis not present

## 2023-12-29 DIAGNOSIS — M457 Ankylosing spondylitis of lumbosacral region: Secondary | ICD-10-CM

## 2023-12-29 DIAGNOSIS — K439 Ventral hernia without obstruction or gangrene: Secondary | ICD-10-CM

## 2023-12-29 MED ORDER — AMPHETAMINE-DEXTROAMPHETAMINE 20 MG PO TABS: 20.0000 mg | ORAL_TABLET | Freq: Two times a day (BID) | ORAL | 0 refills | Status: DC

## 2023-12-29 NOTE — Assessment & Plan Note (Signed)
 ADHD managed with Adderall. Reports improved focus and consistency with medication, though occasionally misses doses. Current regimen of 20 mg Adderall twice daily, with flexibility for half doses as needed, effectively maintains focus without affecting sleep. - Continue Adderall 20 mg twice daily with flexibility for half doses as needed. - Send prescription refills to [facility] at [facility].

## 2023-12-29 NOTE — Assessment & Plan Note (Signed)
 Blood pressure well-controlled without medication. Previously prescribed hydrochlorothiazide but not currently taking it. Recent blood pressure readings satisfactory without medication. - Monitor blood pressure without medication.

## 2023-12-29 NOTE — Assessment & Plan Note (Signed)
 Chronic ventral hernia above the umbilicus present since at least 2019, with recent acute pain episode following a cough, causing transient protrusion and tenderness in the left pelvic area. Possible new inguinal hernia. No current bulging or tenderness on self-examination. Referral to [facility] for evaluation and potential robotic surgical repair, which offers improved recovery. - Refer to [facility] for evaluation and potential robotic surgical repair of hernia. - Await call from [facility] to schedule an appointment.

## 2023-12-29 NOTE — Progress Notes (Signed)
 MyChart Video Visit    Virtual Visit via Video Note   This format is felt to be most appropriate for this patient at this time. Physical exam was limited by quality of the video and audio technology used for the visit.    Patient location: work Restaurant manager, fast food location: [employer] Persons involved in the visit: patient, provider  I discussed the limitations of evaluation and management by telemedicine and the availability of in person appointments. The patient expressed understanding and agreed to proceed.  Patient: Joshua Bartlett   DOB: 11/23/1980   43 y.o. Male  MRN: [id number] Visit Date: 12/29/2023  Today's healthcare provider: Shirlee Latch, MD   No chief complaint on file.  Subjective    HPI   Discussed the use of AI scribe software for clinical note transcription with the patient, who gave verbal consent to proceed.  History of Present Illness   Joshua Bartlett, a patient with a history of ADHD and a known hernia, presents for a routine follow-up. He reports that his ADHD symptoms have improved with medication, although he admits to occasionally forgetting to take it. He has experimented with different dosing schedules to manage his symptoms and sleep patterns, and has found that taking one to one and a half tablets in the morning and another half to one tablet no later than 2 PM works best for him. He denies any adverse effects from the medication.  Regarding his hernia, he expresses a desire to pursue surgical repair. He reports a recent episode of severe pain in the area of the hernia, which occurred upon coughing during sleep. The pain was severe enough to limit his mobility for a day, but resolved by the next day. He denies any changes in the appearance of the hernia. He also mentions unintentional weight loss, but it is unclear if this is related to the hernia.         Review of Systems      Objective    BP 120/72 Comment: recent Rheum  visit      Physical Exam Constitutional:      General: He is not in acute distress.    Appearance: Normal appearance. He is not diaphoretic.  HENT:     Head: Normocephalic.  Eyes:     Conjunctiva/sclera: Conjunctivae normal.  Pulmonary:     Effort: Pulmonary effort is normal. No respiratory distress.  Neurological:     Mental Status: He is alert and oriented to person, place, and time. Mental status is at baseline.        Assessment & Plan     Problem List Items Addressed This Visit       Cardiovascular and Mediastinum   Primary hypertension   Blood pressure well-controlled without medication. Previously prescribed hydrochlorothiazide but not currently taking it. Recent blood pressure readings satisfactory without medication. - Monitor blood pressure without medication.         Musculoskeletal and Integument   Ankylosing spondylitis (HCC)   F/b Rheumatology        Other   Adult ADHD - Primary   ADHD managed with Adderall. Reports improved focus and consistency with medication, though occasionally misses doses. Current regimen of 20 mg Adderall twice daily, with flexibility for half doses as needed, effectively maintains focus without affecting sleep. - Continue Adderall 20 mg twice daily with flexibility for half doses as needed. - [facility]      Ventral hernia without obstruction or gangrene  Chronic ventral hernia above the umbilicus present since at least 2019, with recent acute pain episode following a cough, causing transient protrusion and tenderness in the left pelvic area. Possible new inguinal hernia. No current bulging or tenderness on self-examination. Referral to [facility] for evaluation and potential robotic surgical repair, which offers improved recovery. - Refer to [facility] for evaluation and potential robotic surgical repair of hernia. - Await call from [facility] to schedule an  appointment.      Relevant Orders   Ambulatory referral to General Surgery     Meds ordered this encounter  Medications   amphetamine-dextroamphetamine (ADDERALL) 20 MG tablet    Sig: Take 1 tablet (20 mg total) by mouth 2 (two) times daily.    Dispense:  60 tablet    Refill:  0    Do not fill <30 days from last refill   amphetamine-dextroamphetamine (ADDERALL) 20 MG tablet    Sig: Take 1 tablet (20 mg total) by mouth 2 (two) times daily.    Dispense:  60 tablet    Refill:  0    Do not fill <30 days from last refill   amphetamine-dextroamphetamine (ADDERALL) 20 MG tablet    Sig: Take 1 tablet (20 mg total) by mouth 2 (two) times daily.    Dispense:  60 tablet    Refill:  0    Do not fill <30 days from last refill     Return in about 6 months (around 06/29/2024) for CPE.     I discussed the assessment and treatment plan with the patient. The patient was provided an opportunity to ask questions and all were answered. The patient agreed with the plan and demonstrated an understanding of the instructions.   The patient was advised to call back or seek an in-person evaluation if the symptoms worsen or if the condition fails to improve as anticipated.  Shirlee Latch, MD [facility] [phone] (phone) [phone] (fax)  [facility]

## 2023-12-29 NOTE — Assessment & Plan Note (Signed)
 F/b Rheumatology
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
## 2024-01-03 ENCOUNTER — Telehealth: Payer: Self-pay | Admitting: Surgery

## 2024-01-03 ENCOUNTER — Ambulatory Visit: Payer: Self-pay | Admitting: Surgery

## 2024-01-03 ENCOUNTER — Encounter: Payer: Self-pay | Admitting: Surgery

## 2024-01-03 ENCOUNTER — Ambulatory Visit: Admitting: Surgery

## 2024-01-03 VITALS — BP 133/81 | HR 76 | Ht 74.0 in | Wt 250.0 lb

## 2024-01-03 DIAGNOSIS — K436 Other and unspecified ventral hernia with obstruction, without gangrene: Secondary | ICD-10-CM

## 2024-01-03 NOTE — Patient Instructions (Signed)
 You have requested to have a Ventral Hernia Repair. This will be done by Dr Claudine Mouton at [facility]. Please see your (BLUE) Pre-care sheet for more information. Our surgery scheduler will call you to look at surgery dates and to go over surgery information.   If you are on any injectable weight loss medication, you will need to stop taking your GLP-1 injectable (weight loss) medications 8 days before your surgery to avoid any complications with anesthesia.   You will need to arrange to be out of work for approximately 1-2 weeks and then you may return with a lifting restriction for 4 more weeks. If you have FMLA or Disability paperwork that needs to be filled out, please have your company fax your paperwork to [phone] or you may drop this by either office. This paperwork will be filled out within 3 days after your surgery has been completed.     Ventral Hernia A ventral hernia (also called an incisional hernia) is a hernia that occurs at the site of a weakness in the abdomen. The abdominal wall spans from your lower chest down to your pelvis. If the abdominal wall is weakened from a surgical incision, a hernia can occur. A hernia is a bulge of bowel or muscle tissue pushing out on the weakened part of the abdominal wall. Ventral hernias can get bigger from straining or lifting. Obese and older people are at higher risk for a ventral hernia. People who develop infections after surgery or require repeat incisions at the same site on the abdomen are also at increased risk. CAUSES  A ventral hernia occurs because of weakness in the abdominal wall at an incision site.  SYMPTOMS  Common symptoms include: A visible bulge or lump on the abdominal wall. Pain or tenderness around the lump. Increased discomfort if you cough or make a sudden movement. If the hernia has blocked part of the intestine, a serious complication can occur (incarcerated or strangulated hernia). This can become a problem that requires  emergency surgery because the blood flow to the blocked intestine may be cut off. Symptoms may include: Feeling sick to your stomach (nauseous). Throwing up (vomiting). Stomach swelling (distention) or bloating. Fever. Rapid heartbeat. DIAGNOSIS  Your health care provider will take a medical history and perform a physical exam. Various tests may be ordered, such as: Blood tests. Urine tests. Ultrasonography. X-rays. Computed tomography (CT). TREATMENT  Watchful waiting may be all that is needed for a smaller hernia that does not cause symptoms. Your health care provider may recommend the use of a supportive belt (truss) that helps to keep the abdominal wall intact. For larger hernias or those that cause pain, surgery to repair the hernia is usually recommended. If a hernia becomes strangulated, emergency surgery needs to be done right away. HOME CARE INSTRUCTIONS Avoid putting pressure or strain on the abdominal area. Avoid heavy lifting. Use good body positioning for physical tasks. Ask your health care provider about proper body positioning. Use a supportive belt as directed by your health care provider. Maintain a healthy weight. Eat foods that are high in fiber, such as whole grains, fruits, and vegetables. Fiber helps prevent difficult bowel movements (constipation). Drink enough fluids to keep your urine clear or pale yellow. Follow up with your health care provider as directed. SEEK MEDICAL CARE IF:  Your hernia seems to be getting larger or more painful. SEEK IMMEDIATE MEDICAL CARE IF:  You have abdominal pain that is sudden and sharp. Your pain becomes severe. You  have repeated vomiting. You are sweating a lot. You notice a rapid heartbeat. You develop a fever. MAKE SURE YOU:  Understand these instructions. Will watch your condition. Will get help right away if you are not doing well or get worse.     Open Ventral Hernia Repair Open ventral hernia repair is a surgery  to fix a ventral hernia. A ventral hernia,  is a bulge of body tissue or intestines that pushes through the front part of the abdomen. This can happen if the connective tissue covering the muscles over the abdomen has a weak spot or is torn because of a surgical cut (incision) from a previous surgery. A ventral hernia repair is often done soon after diagnosis to stop the hernia from getting bigger, becoming uncomfortable, or becoming an emergency. This surgery usually takes about 2 hours, but the time can vary greatly.  LET [facility] CARE PROVIDER KNOW ABOUT: Any allergies you have. All medicines you are taking, including steroids, vitamins, herbs, eye drops, creams, and over-the-counter medicines. Previous problems you or members of your family have had with the use of anesthetics. Any blood disorders you have. Previous surgeries you have had. Medical conditions you have.  RISKS AND COMPLICATIONS  Generally, Open ventral hernia repair is a safe procedure. However, as with any surgical procedure, problems can occur. Possible problems include: Bleeding. Trouble passing urine or having a bowel movement after the surgery. Infection. Pneumonia. Blood clots. Pain in the area of the hernia. A bulge in the area of the hernia that may be caused by a collection of fluid. Injury to intestines or other structures in the abdomen. Return of the hernia after surgery.  BEFORE THE PROCEDURE  You may need to have blood tests, urine tests, a chest X-ray, or an electrocardiogram done before the day of the surgery. Ask your health care provider about changing or stopping your regular medicines. This is especially important if you are taking diabetes medicines or blood thinners. You may need to wash with a special type of germ-killing soap. Do not eat or drink anything after midnight the night before the procedure or as directed by your health care provider. Make plans to have someone drive you home after  the procedure.  PROCEDURE  Small monitors will be put on your body. They are used to check your heart, blood pressure, and oxygen level. An IV access tube will be put into a vein in your hand or arm. Fluids and medicine will flow directly into your body through the IV tube. You will be given medicine that makes you go to sleep (general anesthetic). Your abdomen will be cleaned with a special soap to kill any germs on your skin. Once you are asleep, a moderate - large size incision will be made in your abdomen. The size of incision depends on how large your hernia is. Your surgeon puts the tissue or intestines that formed the hernia back in place. A screen-like patch (mesh) is used to close the hernia. This helps make the area stronger. Stitches, tacks, or staples are used to keep the mesh in place. Medicine and a bandage (dressing) or skin glue will be put over the incision.  AFTER THE PROCEDURE  You will stay in a recovery area until the anesthetic wears off. Your blood pressure and pulse will be checked often. You may be able to go home the same day or may need to stay in the hospital for 1-2 days after surgery. Your surgeon will decide  when you can go home depending upon your recovery. You may feel some pain. You will be given medicine for pain. You will be urged to do breathing exercises that involve taking deep breaths. This helps prevent a lung infection after a surgery. You may have to wear compression stockings while you are in the hospital. These stockings help keep blood clots from forming in your legs.   This information is not intended to replace advice given to you by your health care provider. Make sure you discuss any questions you have with your health care provider.   Document Released: 08/30/2012 Document Revised: 09/18/2013 Document Reviewed: 08/30/2012 Elsevier Interactive Patient Education [employer]

## 2024-01-03 NOTE — Telephone Encounter (Signed)
 Patient has been advised of Pre-Admission date/time, and Surgery date at [facility].  Surgery Date: 01/11/24 Preadmission Testing Date: 01/06/24 (phone 8a-1p  Patient has been made aware of the scheduling process and surgery information is given.   Patient has been made aware to call [phone], between 1-3:00pm the day before surgery, to find out what time to arrive for surgery.

## 2024-01-06 ENCOUNTER — Inpatient Hospital Stay
Admission: RE | Admit: 2024-01-06 | Discharge: 2024-01-06 | Disposition: A | Discharge status: Home / Self Care | Source: Ambulatory Visit

## 2024-01-06 HISTORY — DX: Essential (primary) hypertension: I10

## 2024-01-06 NOTE — Pre-Procedure Instructions (Signed)
 Pat called for his scheduled PAT appt, he reports that he needs too cancel his surgery and reschedule, I informed him to call MD office today asap. Secure message sent by this writer to [employer] .

## 2024-01-09 ENCOUNTER — Telehealth: Payer: Self-pay | Admitting: Surgery

## 2024-01-09 NOTE — Telephone Encounter (Signed)
 Patient told preadmit that he wants to reschedule his surgery with Dr. Ofilia Benton on 01/11/24.  Multiple attempts have been made to call patient, left messages for him. To date patient has yet to return call. Surgery for 01/11/24 is cancelled.  Will await patient to call.

## 2024-01-11 ENCOUNTER — Ambulatory Visit: Admission: RE | Admit: 2024-01-11 | Source: Home / Self Care | Admitting: Surgery

## 2024-01-11 ENCOUNTER — Encounter: Admission: RE | Payer: Self-pay | Source: Home / Self Care

## 2024-01-11 SURGERY — REPAIR, HERNIA, VENTRAL
Anesthesia: General | Site: Abdomen

## 2024-02-06 ENCOUNTER — Ambulatory Visit: Payer: Self-pay | Admitting: Family Medicine

## 2024-02-06 NOTE — Telephone Encounter (Signed)
 Copied from CRM [id number]. Topic: Clinical - Medication Refill >> Feb 06, 2024  3:29 PM Lynnie Saucier S wrote: Medication: amphetamine -dextroamphetamine  (ADDERALL) 20 MG tablet  Has the patient contacted their pharmacy? Yes (Agent: If no, request that the patient contact the pharmacy for the refill. If patient does not wish to contact the pharmacy document the reason why and proceed with request.) (Agent: If yes, when and what did the pharmacy advise?)  This is the patient's preferred pharmacy:  [facility] #[id number] [place], [place] - [address] AT [facility] OF [address] [place] [address] Phone: [phone] Fax: [phone]    Is this the correct pharmacy for this prescription? Yes If no, delete pharmacy and type the correct one.   Has the prescription been filled recently? No  Is the patient out of the medication? Yes  Has the patient been seen for an appointment in the last year OR does the patient have an upcoming appointment? Yes  Can we respond through MyChart? Yes  Agent: Please be advised that Rx refills may take up to 3 business days. We ask that you follow-up with your pharmacy.

## 2024-02-06 NOTE — Telephone Encounter (Signed)
 Copied from CRM [id number]. Topic: Clinical - Medication Refill >> Feb 06, 2024  3:31 PM Lynnie Saucier S wrote: Medication: amphetamine -dextroamphetamine  (ADDERALL) 20 MG tablet  Has the patient contacted their pharmacy? No (Agent: If no, request that the patient contact the pharmacy for the refill. If patient does not wish to contact the pharmacy document the reason why and proceed with request.) (Agent: If yes, when and what did the pharmacy advise?)  This is the patient's preferred pharmacy:  [facility] #[id number] [place], [place] - [address] AT [facility] OF [address] [place] [address] Phone: [phone] Fax: [phone]   Is this the correct pharmacy for this prescription? Yes If no, delete pharmacy and type the correct one.   Has the prescription been filled recently? No  Is the patient out of the medication? Yes  Has the patient been seen for an appointment in the last year OR does the patient have an upcoming appointment? Yes  Can we respond through MyChart? Yes  Agent: Please be advised that Rx refills may take up to 3 business days. We ask that you follow-up with your pharmacy.

## 2024-02-29 ENCOUNTER — Other Ambulatory Visit: Payer: Self-pay | Admitting: Family Medicine

## 2024-02-29 NOTE — Telephone Encounter (Signed)
 Pt called to report that he requested this two weeks ago and called last week but the phone lines disconnected once someone answered as if they hung up. Please advise, pt has been out for two weeks.    Copied from CRM [id number]. Topic: Clinical - Medication Refill >> Feb 29, 2024  8:12 AM Bearl Botts E wrote: Medication: amphetamine -dextroamphetamine  (ADDERALL) 20 MG tablet  Has the patient contacted their pharmacy? Yes (Agent: If no, request that the patient contact the pharmacy for the refill. If patient does not wish to contact the pharmacy document the reason why and proceed with request.) (Agent: If yes, when and what did the pharmacy advise?)  This is the patient's preferred pharmacy:  [facility] #[id number] [place], [place] - [address] AT [facility] OF [address] [place] [address] Phone: [phone] Fax: [phone]  Is this the correct pharmacy for this prescription? Yes If no, delete pharmacy and type the correct one.   Has the prescription been filled recently? Yes  Is the patient out of the medication? Yes  Has the patient been seen for an appointment in the last year OR does the patient have an upcoming appointment? Yes  Can we respond through MyChart? Yes.  Agent: Please be advised that Rx refills may take up to 3 business days. We ask that you follow-up with your pharmacy.

## 2024-03-01 NOTE — Telephone Encounter (Signed)
 Requested medications are due for refill today.  yes  Requested medications are on the active medications list.  yes  Last refill. 12/29/2023 #60 0 rf  Future visit scheduled.   no  Notes to clinic.  Refill not delegated.    Requested Prescriptions  Pending Prescriptions Disp Refills   amphetamine -dextroamphetamine  (ADDERALL) 20 MG tablet 60 tablet 0    Sig: Take 1 tablet (20 mg total) by mouth 2 (two) times daily.     Not Delegated - Psychiatry:  Stimulants/ADHD Failed - 03/01/2024 11:02 AM      Failed - This refill cannot be delegated      Failed - Urine Drug Screen completed in last 360 days      Passed - Last BP in normal range    BP Readings from Last 1 Encounters:  01/03/24 133/81         Passed - Last Heart Rate in normal range    Pulse Readings from Last 1 Encounters:  01/03/24 76         Passed - Valid encounter within last 6 months    Recent Outpatient Visits           2 months ago Adult ADHD   [facility] [place], Stan Eans, MD   3 months ago No-show for appointment   [facility] [place], Stan Eans, MD

## 2024-03-02 MED ORDER — AMPHETAMINE-DEXTROAMPHETAMINE 20 MG PO TABS: 20.0000 mg | ORAL_TABLET | Freq: Two times a day (BID) | ORAL | 0 refills | Status: DC

## 2024-04-02 ENCOUNTER — Other Ambulatory Visit: Payer: Self-pay | Admitting: Family Medicine

## 2024-04-02 NOTE — Telephone Encounter (Signed)
 Requested medication (s) are due for refill today: yes   Requested medication (s) are on the active medication list: yes   Last refill:  03/02/24 #60  Future visit scheduled: yes   Notes to clinic:  Please review for refill. Refill not delegated per protocol.     Requested Prescriptions  Pending Prescriptions Disp Refills   amphetamine -dextroamphetamine  (ADDERALL) 20 MG tablet 60 tablet 0    Sig: Take 1 tablet (20 mg total) by mouth 2 (two) times daily.     Not Delegated - Psychiatry:  Stimulants/ADHD Failed - 04/02/2024  4:55 PM      Failed - This refill cannot be delegated      Failed - Urine Drug Screen completed in last 360 days      Passed - Last BP in normal range    BP Readings from Last 1 Encounters:  01/03/24 133/81         Passed - Last Heart Rate in normal range    Pulse Readings from Last 1 Encounters:  01/03/24 76         Passed - Valid encounter within last 6 months    Recent Outpatient Visits           3 months ago Adult ADHD   [facility] [place], Jon HERO, MD   5 months ago No-show for appointment   [facility] [place], Jon HERO, MD

## 2024-04-02 NOTE — Telephone Encounter (Unsigned)
 Copied from CRM [id number]. Topic: Clinical - Medication Refill >> Apr 02, 2024  8:02 AM Carlatta H wrote: Medication:amphetamine -dextroamphetamine  (ADDERALL) 20 MG tablet   Has the patient contacted their pharmacy? No (Agent: If no, request that the patient contact the pharmacy for the refill. If patient does not wish to contact the pharmacy document the reason why and proceed with request.) (Agent: If yes, when and what did the pharmacy advise?)  This is the patient's preferred pharmacy:  [facility] #[id number] [place], [place] - [address] AT [facility] OF [address] [place] [address] Phone: [phone] Fax: [phone]    Is this the correct pharmacy for this prescription? Yes If no, delete pharmacy and type the correct one.   Has the prescription been filled recently? No  Is the patient out of the medication? Yes  Has the patient been seen for an appointment in the last year OR does the patient have an upcoming appointment? Yes  Can we respond through MyChart? Yes  Agent: Please be advised that Rx refills may take up to 3 business days. We ask that you follow-up with your pharmacy.

## 2024-04-03 MED ORDER — AMPHETAMINE-DEXTROAMPHETAMINE 20 MG PO TABS: 20.0000 mg | ORAL_TABLET | Freq: Two times a day (BID) | ORAL | 0 refills | Status: DC

## 2024-05-11 ENCOUNTER — Inpatient Hospital Stay
Admit: 2024-05-11 | Discharge: 2024-05-11 | Disposition: A | Discharge status: Home / Self Care | Attending: Student | Admitting: Student

## 2024-05-11 ENCOUNTER — Telehealth: Payer: Self-pay

## 2024-05-11 ENCOUNTER — Inpatient Hospital Stay

## 2024-05-11 ENCOUNTER — Encounter: Payer: Self-pay | Admitting: Cardiology

## 2024-05-11 ENCOUNTER — Other Ambulatory Visit

## 2024-05-11 ENCOUNTER — Inpatient Hospital Stay
Admission: EM | Admit: 2024-05-11 | Discharge: 2024-05-14 | DRG: 321 | Disposition: A | Discharge status: Other Acute Inpatient Hospital | Attending: Internal Medicine | Admitting: Internal Medicine

## 2024-05-11 ENCOUNTER — Emergency Department

## 2024-05-11 ENCOUNTER — Encounter
Admission: EM | Disposition: A | Discharge status: Other Acute Inpatient Hospital | Payer: Self-pay | Source: Home / Self Care | Attending: Pulmonary Disease

## 2024-05-11 DIAGNOSIS — I472 Ventricular tachycardia, unspecified: Secondary | ICD-10-CM | POA: Diagnosis present

## 2024-05-11 DIAGNOSIS — Z87891 Personal history of nicotine dependence: Secondary | ICD-10-CM

## 2024-05-11 DIAGNOSIS — Z818 Family history of other mental and behavioral disorders: Secondary | ICD-10-CM

## 2024-05-11 DIAGNOSIS — Z79899 Other long term (current) drug therapy: Secondary | ICD-10-CM

## 2024-05-11 DIAGNOSIS — R0989 Other specified symptoms and signs involving the circulatory and respiratory systems: Secondary | ICD-10-CM | POA: Diagnosis not present

## 2024-05-11 DIAGNOSIS — R569 Unspecified convulsions: Secondary | ICD-10-CM | POA: Diagnosis not present

## 2024-05-11 DIAGNOSIS — I462 Cardiac arrest due to underlying cardiac condition: Secondary | ICD-10-CM | POA: Diagnosis present

## 2024-05-11 DIAGNOSIS — Z604 Social exclusion and rejection: Secondary | ICD-10-CM | POA: Diagnosis present

## 2024-05-11 DIAGNOSIS — F1290 Cannabis use, unspecified, uncomplicated: Secondary | ICD-10-CM | POA: Diagnosis not present

## 2024-05-11 DIAGNOSIS — R7989 Other specified abnormal findings of blood chemistry: Secondary | ICD-10-CM | POA: Diagnosis not present

## 2024-05-11 DIAGNOSIS — J9602 Acute respiratory failure with hypercapnia: Secondary | ICD-10-CM | POA: Diagnosis present

## 2024-05-11 DIAGNOSIS — I4901 Ventricular fibrillation: Secondary | ICD-10-CM | POA: Diagnosis not present

## 2024-05-11 DIAGNOSIS — G9349 Other encephalopathy: Secondary | ICD-10-CM | POA: Diagnosis present

## 2024-05-11 DIAGNOSIS — I493 Ventricular premature depolarization: Secondary | ICD-10-CM | POA: Diagnosis present

## 2024-05-11 DIAGNOSIS — M459 Ankylosing spondylitis of unspecified sites in spine: Secondary | ICD-10-CM | POA: Diagnosis not present

## 2024-05-11 DIAGNOSIS — Z5986 Financial insecurity: Secondary | ICD-10-CM

## 2024-05-11 DIAGNOSIS — I2510 Atherosclerotic heart disease of native coronary artery without angina pectoris: Secondary | ICD-10-CM | POA: Diagnosis present

## 2024-05-11 DIAGNOSIS — I255 Ischemic cardiomyopathy: Secondary | ICD-10-CM | POA: Diagnosis not present

## 2024-05-11 DIAGNOSIS — Z452 Encounter for adjustment and management of vascular access device: Secondary | ICD-10-CM | POA: Diagnosis not present

## 2024-05-11 DIAGNOSIS — Z885 Allergy status to narcotic agent status: Secondary | ICD-10-CM

## 2024-05-11 DIAGNOSIS — J9601 Acute respiratory failure with hypoxia: Secondary | ICD-10-CM | POA: Diagnosis not present

## 2024-05-11 DIAGNOSIS — Z7984 Long term (current) use of oral hypoglycemic drugs: Secondary | ICD-10-CM | POA: Diagnosis not present

## 2024-05-11 DIAGNOSIS — Z8674 Personal history of sudden cardiac arrest: Secondary | ICD-10-CM | POA: Diagnosis not present

## 2024-05-11 DIAGNOSIS — J9811 Atelectasis: Secondary | ICD-10-CM | POA: Diagnosis not present

## 2024-05-11 DIAGNOSIS — I5021 Acute systolic (congestive) heart failure: Secondary | ICD-10-CM | POA: Diagnosis not present

## 2024-05-11 DIAGNOSIS — E669 Obesity, unspecified: Secondary | ICD-10-CM | POA: Diagnosis present

## 2024-05-11 DIAGNOSIS — Z6832 Body mass index (BMI) 32.0-32.9, adult: Secondary | ICD-10-CM | POA: Diagnosis not present

## 2024-05-11 DIAGNOSIS — I110 Hypertensive heart disease with heart failure: Secondary | ICD-10-CM | POA: Diagnosis not present

## 2024-05-11 DIAGNOSIS — F419 Anxiety disorder, unspecified: Secondary | ICD-10-CM | POA: Diagnosis present

## 2024-05-11 DIAGNOSIS — I2102 ST elevation (STEMI) myocardial infarction involving left anterior descending coronary artery: Secondary | ICD-10-CM | POA: Diagnosis not present

## 2024-05-11 DIAGNOSIS — Z7902 Long term (current) use of antithrombotics/antiplatelets: Secondary | ICD-10-CM | POA: Diagnosis not present

## 2024-05-11 DIAGNOSIS — Z4682 Encounter for fitting and adjustment of non-vascular catheter: Secondary | ICD-10-CM | POA: Diagnosis not present

## 2024-05-11 DIAGNOSIS — G8929 Other chronic pain: Secondary | ICD-10-CM | POA: Diagnosis not present

## 2024-05-11 DIAGNOSIS — R140 Abdominal distension (gaseous): Secondary | ICD-10-CM | POA: Diagnosis not present

## 2024-05-11 DIAGNOSIS — I219 Acute myocardial infarction, unspecified: Secondary | ICD-10-CM | POA: Diagnosis not present

## 2024-05-11 DIAGNOSIS — E66811 Obesity, class 1: Secondary | ICD-10-CM | POA: Diagnosis present

## 2024-05-11 DIAGNOSIS — I2109 ST elevation (STEMI) myocardial infarction involving other coronary artery of anterior wall: Principal | ICD-10-CM | POA: Diagnosis present

## 2024-05-11 DIAGNOSIS — I213 ST elevation (STEMI) myocardial infarction of unspecified site: Principal | ICD-10-CM

## 2024-05-11 DIAGNOSIS — I469 Cardiac arrest, cause unspecified: Secondary | ICD-10-CM | POA: Diagnosis not present

## 2024-05-11 DIAGNOSIS — E785 Hyperlipidemia, unspecified: Secondary | ICD-10-CM | POA: Diagnosis not present

## 2024-05-11 DIAGNOSIS — R9431 Abnormal electrocardiogram [ECG] [EKG]: Secondary | ICD-10-CM | POA: Diagnosis not present

## 2024-05-11 DIAGNOSIS — E8809 Other disorders of plasma-protein metabolism, not elsewhere classified: Secondary | ICD-10-CM | POA: Diagnosis not present

## 2024-05-11 DIAGNOSIS — E876 Hypokalemia: Secondary | ICD-10-CM | POA: Diagnosis present

## 2024-05-11 DIAGNOSIS — Z7982 Long term (current) use of aspirin: Secondary | ICD-10-CM | POA: Diagnosis not present

## 2024-05-11 DIAGNOSIS — M457 Ankylosing spondylitis of lumbosacral region: Secondary | ICD-10-CM | POA: Diagnosis not present

## 2024-05-11 DIAGNOSIS — I513 Intracardiac thrombosis, not elsewhere classified: Secondary | ICD-10-CM | POA: Diagnosis not present

## 2024-05-11 DIAGNOSIS — I10 Essential (primary) hypertension: Secondary | ICD-10-CM | POA: Diagnosis not present

## 2024-05-11 DIAGNOSIS — I471 Supraventricular tachycardia, unspecified: Secondary | ICD-10-CM | POA: Diagnosis not present

## 2024-05-11 DIAGNOSIS — R918 Other nonspecific abnormal finding of lung field: Secondary | ICD-10-CM | POA: Diagnosis not present

## 2024-05-11 DIAGNOSIS — F909 Attention-deficit hyperactivity disorder, unspecified type: Secondary | ICD-10-CM | POA: Diagnosis not present

## 2024-05-11 DIAGNOSIS — Z8249 Family history of ischemic heart disease and other diseases of the circulatory system: Secondary | ICD-10-CM

## 2024-05-11 DIAGNOSIS — I5020 Unspecified systolic (congestive) heart failure: Secondary | ICD-10-CM | POA: Diagnosis not present

## 2024-05-11 DIAGNOSIS — F17290 Nicotine dependence, other tobacco product, uncomplicated: Secondary | ICD-10-CM | POA: Diagnosis present

## 2024-05-11 DIAGNOSIS — R079 Chest pain, unspecified: Secondary | ICD-10-CM | POA: Diagnosis not present

## 2024-05-11 DIAGNOSIS — K7200 Acute and subacute hepatic failure without coma: Secondary | ICD-10-CM | POA: Diagnosis not present

## 2024-05-11 HISTORY — PX: CORONARY/GRAFT ACUTE MI REVASCULARIZATION: CATH118305

## 2024-05-11 HISTORY — PX: LEFT HEART CATH AND CORONARY ANGIOGRAPHY: CATH118249

## 2024-05-11 LAB — CBC WITH DIFFERENTIAL/PLATELET
Abs Immature Granulocytes: 0.36 K/uL — ABNORMAL HIGH (ref 0.00–0.07)
Basophils Absolute: 0.1 K/uL (ref 0.0–0.1)
Basophils Relative: 1 %
Eosinophils Absolute: 0.1 K/uL (ref 0.0–0.5)
Eosinophils Relative: 1 %
HCT: 39.1 % (ref 39.0–52.0)
Hemoglobin: 12.8 g/dL — ABNORMAL LOW (ref 13.0–17.0)
Immature Granulocytes: 2 %
Lymphocytes Relative: 12 %
Lymphs Abs: 2.6 K/uL (ref 0.7–4.0)
MCH: 30.5 pg (ref 26.0–34.0)
MCHC: 32.7 g/dL (ref 30.0–36.0)
MCV: 93.3 fL (ref 80.0–100.0)
Monocytes Absolute: 0.9 K/uL (ref 0.1–1.0)
Monocytes Relative: 4 %
Neutro Abs: 18 K/uL — ABNORMAL HIGH (ref 1.7–7.7)
Neutrophils Relative %: 80 %
Platelets: 345 K/uL (ref 150–400)
RBC: 4.19 MIL/uL — ABNORMAL LOW (ref 4.22–5.81)
RDW: 11.9 % (ref 11.5–15.5)
WBC: 22 K/uL — ABNORMAL HIGH (ref 4.0–10.5)
nRBC: 0 % (ref 0.0–0.2)

## 2024-05-11 LAB — SEDIMENTATION RATE: Sed Rate: 6 mm/h (ref 0–15)

## 2024-05-11 LAB — COMPREHENSIVE METABOLIC PANEL WITH GFR
ALT: 61 U/L — ABNORMAL HIGH (ref 0–44)
ALT: 89 U/L — ABNORMAL HIGH (ref 0–44)
ALT: 81 U/L — ABNORMAL HIGH (ref 0–44)
AST: 315 U/L — ABNORMAL HIGH (ref 15–41)
AST: 547 U/L — ABNORMAL HIGH (ref 15–41)
AST: 478 U/L — ABNORMAL HIGH (ref 15–41)
Albumin: 3.4 g/dL — ABNORMAL LOW (ref 3.5–5.0)
Albumin: 3.3 g/dL — ABNORMAL LOW (ref 3.5–5.0)
Albumin: 3.1 g/dL — ABNORMAL LOW (ref 3.5–5.0)
Alkaline Phosphatase: 43 U/L (ref 38–126)
Alkaline Phosphatase: 42 U/L (ref 38–126)
Alkaline Phosphatase: 41 U/L (ref 38–126)
Anion gap: 11 (ref 5–15)
Anion gap: 8 (ref 5–15)
Anion gap: 4 — ABNORMAL LOW (ref 5–15)
BUN: 16 mg/dL (ref 6–20)
BUN: 14 mg/dL (ref 6–20)
BUN: 16 mg/dL (ref 6–20)
CO2: 23 mmol/L (ref 22–32)
CO2: 22 mmol/L (ref 22–32)
CO2: 25 mmol/L (ref 22–32)
Calcium: 9.2 mg/dL (ref 8.9–10.3)
Calcium: 8.1 mg/dL — ABNORMAL LOW (ref 8.9–10.3)
Calcium: 8 mg/dL — ABNORMAL LOW (ref 8.9–10.3)
Chloride: 102 mmol/L (ref 98–111)
Chloride: 109 mmol/L (ref 98–111)
Chloride: 109 mmol/L (ref 98–111)
Creatinine, Ser: 0.84 mg/dL (ref 0.61–1.24)
Creatinine, Ser: 0.84 mg/dL (ref 0.61–1.24)
Creatinine, Ser: 0.83 mg/dL (ref 0.61–1.24)
GFR, Estimated: >60 mL/min (ref >60)
GFR, Estimated: >60 mL/min (ref >60)
GFR, Estimated: >60 mL/min (ref >60)
Glucose, Bld: 188 mg/dL — ABNORMAL HIGH (ref 70–99)
Glucose, Bld: 103 mg/dL — ABNORMAL HIGH (ref 70–99)
Glucose, Bld: 113 mg/dL — ABNORMAL HIGH (ref 70–99)
Potassium: 3.4 mmol/L — ABNORMAL LOW (ref 3.5–5.1)
Potassium: 4.3 mmol/L (ref 3.5–5.1)
Potassium: 3.8 mmol/L (ref 3.5–5.1)
Sodium: 136 mmol/L (ref 135–145)
Sodium: 140 mmol/L (ref 135–145)
Sodium: 138 mmol/L (ref 135–145)
Total Bilirubin: 1.1 mg/dL (ref 0.0–1.2)
Total Bilirubin: 0.6 mg/dL (ref 0.0–1.2)
Total Bilirubin: 0.7 mg/dL (ref 0.0–1.2)
Total Protein: 6.6 g/dL (ref 6.5–8.1)
Total Protein: 5.9 g/dL — ABNORMAL LOW (ref 6.5–8.1)
Total Protein: 5.8 g/dL — ABNORMAL LOW (ref 6.5–8.1)

## 2024-05-11 LAB — BLOOD GAS, ARTERIAL
Acid-Base Excess: 1.2 mmol/L (ref 0.0–2.0)
Bicarbonate: 27.6 mmol/L (ref 20.0–28.0)
FIO2: 80 %
MECHVT: 500 mL
Mechanical Rate: 18
O2 Saturation: 99.9 %
PEEP: 5 cmH2O
Patient temperature: 37
pCO2 arterial: 50 mmHg — ABNORMAL HIGH (ref 32–48)
pH, Arterial: 7.35 (ref 7.35–7.45)
pO2, Arterial: 212 mmHg — ABNORMAL HIGH (ref 83–108)

## 2024-05-11 LAB — URINALYSIS, COMPLETE (UACMP) WITH MICROSCOPIC
Bilirubin Urine: NEGATIVE
Glucose, UA: NEGATIVE mg/dL
Ketones, ur: NEGATIVE mg/dL
Leukocytes,Ua: NEGATIVE
Nitrite: NEGATIVE
Protein, ur: 100 mg/dL — AB
Specific Gravity, Urine: >1.046 — ABNORMAL HIGH (ref 1.005–1.030)
Squamous Epithelial / HPF: 0 /HPF (ref 0–5)
pH: 5 (ref 5.0–8.0)

## 2024-05-11 LAB — HEMOGLOBIN AND HEMATOCRIT, BLOOD
HCT: 38.2 % — ABNORMAL LOW (ref 39.0–52.0)
Hemoglobin: 12.4 g/dL — ABNORMAL LOW (ref 13.0–17.0)

## 2024-05-11 LAB — LIPID PANEL
Cholesterol: 195 mg/dL (ref 0–200)
HDL: 44 mg/dL (ref >40)
LDL Cholesterol: 136 mg/dL — ABNORMAL HIGH (ref 0–99)
Total CHOL/HDL Ratio: 4.4 ratio
Triglycerides: 75 mg/dL (ref <150)
VLDL: 15 mg/dL (ref 0–40)

## 2024-05-11 LAB — MRSA NEXT GEN BY PCR, NASAL: MRSA by PCR Next Gen: NOT DETECTED

## 2024-05-11 LAB — CBC
HCT: 39.8 % (ref 39.0–52.0)
Hemoglobin: 13 g/dL (ref 13.0–17.0)
MCH: 30.6 pg (ref 26.0–34.0)
MCHC: 32.7 g/dL (ref 30.0–36.0)
MCV: 93.6 fL (ref 80.0–100.0)
Platelets: 330 K/uL (ref 150–400)
RBC: 4.25 MIL/uL (ref 4.22–5.81)
RDW: 12 % (ref 11.5–15.5)
WBC: 15.2 K/uL — ABNORMAL HIGH (ref 4.0–10.5)
nRBC: 0 % (ref 0.0–0.2)

## 2024-05-11 LAB — URINE DRUG SCREEN, QUALITATIVE (ARMC ONLY)
Amphetamines, Ur Screen: POSITIVE — AB
Barbiturates, Ur Screen: NOT DETECTED
Benzodiazepine, Ur Scrn: POSITIVE — AB
Cannabinoid 50 Ng, Ur ~~LOC~~: POSITIVE — AB
Cocaine Metabolite,Ur ~~LOC~~: NOT DETECTED
MDMA (Ecstasy)Ur Screen: NOT DETECTED
Methadone Scn, Ur: NOT DETECTED
Opiate, Ur Screen: NOT DETECTED
Phencyclidine (PCP) Ur S: NOT DETECTED
Tricyclic, Ur Screen: NOT DETECTED

## 2024-05-11 LAB — RESPIRATORY PANEL BY PCR

## 2024-05-11 LAB — GLUCOSE, CAPILLARY
Glucose-Capillary: 181 mg/dL — ABNORMAL HIGH (ref 70–99)
Glucose-Capillary: 45 mg/dL — ABNORMAL LOW (ref 70–99)
Glucose-Capillary: 131 mg/dL — ABNORMAL HIGH (ref 70–99)
Glucose-Capillary: 92 mg/dL (ref 70–99)
Glucose-Capillary: 124 mg/dL — ABNORMAL HIGH (ref 70–99)
Glucose-Capillary: 112 mg/dL — ABNORMAL HIGH (ref 70–99)

## 2024-05-11 LAB — BASIC METABOLIC PANEL WITH GFR
Anion gap: 8 (ref 5–15)
BUN: 18 mg/dL (ref 6–20)
CO2: 28 mmol/L (ref 22–32)
Calcium: 9.2 mg/dL (ref 8.9–10.3)
Chloride: 104 mmol/L (ref 98–111)
Creatinine, Ser: 0.84 mg/dL (ref 0.61–1.24)
GFR, Estimated: >60 mL/min (ref >60)
Glucose, Bld: 113 mg/dL — ABNORMAL HIGH (ref 70–99)
Potassium: 4.4 mmol/L (ref 3.5–5.1)
Sodium: 140 mmol/L (ref 135–145)

## 2024-05-11 LAB — MAGNESIUM
Magnesium: 2 mg/dL (ref 1.7–2.4)
Magnesium: 2 mg/dL (ref 1.7–2.4)

## 2024-05-11 LAB — BRAIN NATRIURETIC PEPTIDE: B Natriuretic Peptide: 3.4 pg/mL (ref 0.0–100.0)

## 2024-05-11 LAB — LACTIC ACID, PLASMA
Lactic Acid, Venous: 3.4 mmol/L (ref 0.5–1.9)
Lactic Acid, Venous: 1.1 mmol/L (ref 0.5–1.9)

## 2024-05-11 LAB — SAMPLE TO BLOOD BANK

## 2024-05-11 LAB — TYPE AND SCREEN
ABO/RH(D): A POS
Antibody Screen: NEGATIVE

## 2024-05-11 LAB — SARS CORONAVIRUS 2 BY RT PCR: SARS Coronavirus 2 by RT PCR: NEGATIVE

## 2024-05-11 LAB — TROPONIN I (HIGH SENSITIVITY): Troponin I (High Sensitivity): >24000 ng/L (ref <18)

## 2024-05-11 LAB — PHOSPHORUS
Phosphorus: 3.6 mg/dL (ref 2.5–4.6)
Phosphorus: 4.3 mg/dL (ref 2.5–4.6)

## 2024-05-11 LAB — C-REACTIVE PROTEIN: CRP: 0.6 mg/dL (ref <1.0)

## 2024-05-11 LAB — PROTIME-INR
INR: 1.2 (ref 0.8–1.2)
Prothrombin Time: 16.2 s — ABNORMAL HIGH (ref 11.4–15.2)

## 2024-05-11 LAB — PROCALCITONIN: Procalcitonin: <0.1 ng/mL

## 2024-05-11 SURGERY — CORONARY/GRAFT ACUTE MI REVASCULARIZATION
Anesthesia: Moderate Sedation

## 2024-05-11 MED ORDER — SODIUM CHLORIDE 0.9 % IV SOLN
2.0000 ug/kg/min | INTRAVENOUS | Status: DC
  Administered 2024-05-11 – 2024-05-12 (×8): 2 ug/kg/min via INTRAVENOUS
  Filled 2024-05-11 (×8): qty 50

## 2024-05-11 MED ORDER — HYDRALAZINE HCL 20 MG/ML IJ SOLN: 10.0000 mg | INTRAMUSCULAR | Status: AC | PRN

## 2024-05-11 MED ORDER — ROCURONIUM BROMIDE 10 MG/ML (PF) SYRINGE
PREFILLED_SYRINGE | INTRAVENOUS | Status: AC
  Filled 2024-05-11: qty 10

## 2024-05-11 MED ORDER — ASPIRIN 81 MG PO CHEW: 81.0000 mg | CHEWABLE_TABLET | Freq: Every day | ORAL | Status: DC

## 2024-05-11 MED ORDER — TIROFIBAN HCL IV 12.5 MG/250 ML
INTRAVENOUS | Status: AC
  Filled 2024-05-11: qty 250

## 2024-05-11 MED ORDER — MAGNESIUM SULFATE IN D5W 1-5 GM/100ML-% IV SOLN
1.0000 g | Freq: Once | INTRAVENOUS | Status: AC
  Administered 2024-05-12: 1 g via INTRAVENOUS
  Filled 2024-05-11: qty 100

## 2024-05-11 MED ORDER — MIDAZOLAM HCL 2 MG/2ML IJ SOLN: 2.0000 mg | Freq: Once | INTRAMUSCULAR | Status: AC

## 2024-05-11 MED ORDER — FENTANYL BOLUS VIA INFUSION
25.0000 ug | INTRAVENOUS | Status: DC | PRN
  Administered 2024-05-11: 25 ug via INTRAVENOUS
  Administered 2024-05-11: 75 ug via INTRAVENOUS
  Administered 2024-05-12 (×2): 100 ug via INTRAVENOUS

## 2024-05-11 MED ORDER — FREE WATER
30.0000 mL | Status: DC
  Administered 2024-05-11 – 2024-05-12 (×4): 30 mL

## 2024-05-11 MED ORDER — FAMOTIDINE 20 MG PO TABS
20.0000 mg | ORAL_TABLET | Freq: Two times a day (BID) | ORAL | Status: DC
  Administered 2024-05-11 (×2): 20 mg
  Filled 2024-05-11 (×2): qty 1

## 2024-05-11 MED ORDER — HEPARIN (PORCINE) IN NACL 1000-0.9 UT/500ML-% IV SOLN
INTRAVENOUS | Status: AC
  Filled 2024-05-11: qty 1000

## 2024-05-11 MED ORDER — MIDAZOLAM HCL 2 MG/2ML IJ SOLN
INTRAMUSCULAR | Status: AC
  Filled 2024-05-11: qty 2

## 2024-05-11 MED ORDER — IOHEXOL 300 MG/ML  SOLN
INTRAMUSCULAR | Status: DC | PRN
  Administered 2024-05-11: 202 mL

## 2024-05-11 MED ORDER — POLYETHYLENE GLYCOL 3350 17 G PO PACK: 17.0000 g | PACK | Freq: Every day | ORAL | Status: DC

## 2024-05-11 MED ORDER — CANGRELOR TETRASODIUM 50 MG IV SOLR
INTRAVENOUS | Status: AC
  Filled 2024-05-11: qty 50

## 2024-05-11 MED ORDER — ASPIRIN 81 MG PO CHEW
81.0000 mg | CHEWABLE_TABLET | Freq: Every day | ORAL | Status: DC
Start: 2024-05-12 — End: 2024-05-11

## 2024-05-11 MED ORDER — HEPARIN SODIUM (PORCINE) 1000 UNIT/ML IJ SOLN
INTRAMUSCULAR | Status: DC | PRN
Start: 2024-05-11 — End: 2024-05-11
  Administered 2024-05-11: 7000 [IU] via INTRAVENOUS
  Administered 2024-05-11: 5000 [IU] via INTRAVENOUS

## 2024-05-11 MED ORDER — LABETALOL HCL 5 MG/ML IV SOLN: 10.0000 mg | INTRAVENOUS | Status: AC | PRN

## 2024-05-11 MED ORDER — METOPROLOL SUCCINATE ER 50 MG PO TB24
25.0000 mg | ORAL_TABLET | Freq: Every day | ORAL | Status: DC
  Administered 2024-05-11: 25 mg via ORAL
  Filled 2024-05-11: qty 1

## 2024-05-11 MED ORDER — FENTANYL 2500MCG IN NS 250ML (10MCG/ML) PREMIX INFUSION
0.0000 ug/h | INTRAVENOUS | Status: DC
  Administered 2024-05-11 – 2024-05-12 (×2): 75 ug/h via INTRAVENOUS
  Filled 2024-05-11: qty 250

## 2024-05-11 MED ORDER — MIDAZOLAM HCL 2 MG/2ML IJ SOLN
INTRAMUSCULAR | Status: DC | PRN
  Administered 2024-05-11 (×3): 2 mg via INTRAVENOUS

## 2024-05-11 MED ORDER — AMIODARONE HCL IN DEXTROSE 360-4.14 MG/200ML-% IV SOLN
60.0000 mg/h | INTRAVENOUS | Status: AC
  Administered 2024-05-11 (×2): 60 mg/h via INTRAVENOUS
  Filled 2024-05-11: qty 200

## 2024-05-11 MED ORDER — PROPOFOL 1000 MG/100ML IV EMUL
INTRAVENOUS | Status: AC
  Filled 2024-05-11: qty 100

## 2024-05-11 MED ORDER — ONDANSETRON HCL 4 MG/2ML IJ SOLN
4.0000 mg | Freq: Four times a day (QID) | INTRAMUSCULAR | Status: DC | PRN
  Administered 2024-05-12 – 2024-05-14 (×4): 4 mg via INTRAVENOUS
  Filled 2024-05-11 (×4): qty 2

## 2024-05-11 MED ORDER — CHLORHEXIDINE GLUCONATE CLOTH 2 % EX PADS
6.0000 | MEDICATED_PAD | Freq: Every day | CUTANEOUS | Status: DC
  Administered 2024-05-11: 6 via TOPICAL

## 2024-05-11 MED ORDER — ATORVASTATIN CALCIUM 80 MG PO TABS: 80.0000 mg | ORAL_TABLET | Freq: Every day | ORAL | Status: DC

## 2024-05-11 MED ORDER — ORAL CARE MOUTH RINSE
15.0000 mL | OROMUCOSAL | Status: DC
  Administered 2024-05-11 – 2024-05-12 (×11): 15 mL via OROMUCOSAL

## 2024-05-11 MED ORDER — FENTANYL 2500MCG IN NS 250ML (10MCG/ML) PREMIX INFUSION
INTRAVENOUS | Status: AC
Start: 2024-05-11 — End: 2024-05-11
  Administered 2024-05-11: 100 ug/h
  Filled 2024-05-11: qty 250

## 2024-05-11 MED ORDER — VERAPAMIL HCL 2.5 MG/ML IV SOLN
INTRAVENOUS | Status: DC | PRN
  Administered 2024-05-11: 2.5 mg via INTRAVENOUS

## 2024-05-11 MED ORDER — CHLORHEXIDINE GLUCONATE CLOTH 2 % EX PADS
6.0000 | MEDICATED_PAD | Freq: Every day | CUTANEOUS | Status: DC
  Administered 2024-05-12 – 2024-05-13 (×2): 6 via TOPICAL

## 2024-05-11 MED ORDER — LIDOCAINE HCL 1 % IJ SOLN
INTRAMUSCULAR | Status: AC
  Filled 2024-05-11: qty 20

## 2024-05-11 MED ORDER — VERAPAMIL HCL 2.5 MG/ML IV SOLN
INTRAVENOUS | Status: AC
  Filled 2024-05-11: qty 2

## 2024-05-11 MED ORDER — POTASSIUM CHLORIDE 20 MEQ PO PACK
40.0000 meq | PACK | Freq: Once | ORAL | Status: AC
  Administered 2024-05-12: 40 meq
  Filled 2024-05-11: qty 2

## 2024-05-11 MED ORDER — FENTANYL CITRATE PF 50 MCG/ML IJ SOSY: 25.0000 ug | PREFILLED_SYRINGE | Freq: Once | INTRAMUSCULAR | Status: DC

## 2024-05-11 MED ORDER — HEPARIN (PORCINE) IN NACL 1000-0.9 UT/500ML-% IV SOLN
INTRAVENOUS | Status: DC | PRN
  Administered 2024-05-11 (×2): 500 mL

## 2024-05-11 MED ORDER — DOCUSATE SODIUM 50 MG/5ML PO LIQD
100.0000 mg | Freq: Two times a day (BID) | ORAL | Status: DC
  Administered 2024-05-11: 100 mg
  Filled 2024-05-11: qty 10

## 2024-05-11 MED ORDER — TIROFIBAN (AGGRASTAT) BOLUS VIA INFUSION
INTRAVENOUS | Status: DC | PRN
  Administered 2024-05-11: 2825 ug via INTRAVENOUS

## 2024-05-11 MED ORDER — SODIUM CHLORIDE 0.9 % IV SOLN: 250.0000 mL | INTRAVENOUS | Status: DC | PRN

## 2024-05-11 MED ORDER — AMIODARONE HCL 150 MG/3ML IV SOLN
INTRAVENOUS | Status: DC | PRN
Start: 2024-05-11 — End: 2024-05-11
  Administered 2024-05-11 (×2): 150 mg via INTRAVENOUS

## 2024-05-11 MED ORDER — PROPOFOL 1000 MG/100ML IV EMUL
0.0000 ug/kg/min | INTRAVENOUS | Status: DC
  Administered 2024-05-11: 30 ug/kg/min via INTRAVENOUS

## 2024-05-11 MED ORDER — HEPARIN SODIUM (PORCINE) 1000 UNIT/ML IJ SOLN
INTRAMUSCULAR | Status: AC
  Filled 2024-05-11: qty 10

## 2024-05-11 MED ORDER — ROCURONIUM BROMIDE 10 MG/ML (PF) SYRINGE
PREFILLED_SYRINGE | INTRAVENOUS | Status: AC | PRN
  Administered 2024-05-11: 100 mg via INTRAVENOUS

## 2024-05-11 MED ORDER — AMIODARONE HCL 150 MG/3ML IV SOLN
INTRAVENOUS | Status: AC
Start: 2024-05-11 — End: 2024-05-11
  Filled 2024-05-11: qty 3

## 2024-05-11 MED ORDER — MIDAZOLAM HCL 2 MG/2ML IJ SOLN
INTRAMUSCULAR | Status: AC
Start: 2024-05-11 — End: 2024-05-11
  Administered 2024-05-11: 1 mg via INTRAVENOUS
  Filled 2024-05-11: qty 2

## 2024-05-11 MED ORDER — VITAL HP 1.0 CAL PO LIQD
1000.0000 mL | ORAL | Status: DC
  Administered 2024-05-11: 1000 mL

## 2024-05-11 MED ORDER — ETOMIDATE 2 MG/ML IV SOLN
INTRAVENOUS | Status: AC
  Filled 2024-05-11: qty 10

## 2024-05-11 MED ORDER — ATORVASTATIN CALCIUM 80 MG PO TABS
80.0000 mg | ORAL_TABLET | Freq: Every day | ORAL | Status: DC
  Administered 2024-05-11: 80 mg via ORAL
  Filled 2024-05-11: qty 1

## 2024-05-11 MED ORDER — SODIUM CHLORIDE 0.9% FLUSH: 3.0000 mL | INTRAVENOUS | Status: DC | PRN

## 2024-05-11 MED ORDER — SODIUM CHLORIDE 0.9 % IV SOLN
INTRAVENOUS | Status: AC | PRN
  Administered 2024-05-11: 4 ug/kg/min via INTRAVENOUS

## 2024-05-11 MED ORDER — TIROFIBAN HCL IN NACL 5-0.9 MG/100ML-% IV SOLN
0.1500 ug/kg/min | INTRAVENOUS | Status: DC
  Filled 2024-05-11 (×2): qty 100

## 2024-05-11 MED ORDER — AMIODARONE HCL 150 MG/3ML IV SOLN
INTRAVENOUS | Status: AC
  Filled 2024-05-11: qty 3

## 2024-05-11 MED ORDER — PERFLUTREN LIPID MICROSPHERE
1.0000 mL | INTRAVENOUS | Status: AC | PRN
  Administered 2024-05-11: 6 mL via INTRAVENOUS

## 2024-05-11 MED ORDER — AMIODARONE HCL IN DEXTROSE 360-4.14 MG/200ML-% IV SOLN
INTRAVENOUS | Status: AC
  Filled 2024-05-11: qty 200

## 2024-05-11 MED ORDER — TIROFIBAN HCL IN NACL 5-0.9 MG/100ML-% IV SOLN
INTRAVENOUS | Status: AC | PRN
  Administered 2024-05-11: .15 ug/kg/min via INTRAVENOUS

## 2024-05-11 MED ORDER — SODIUM CHLORIDE 0.9% FLUSH
3.0000 mL | Freq: Two times a day (BID) | INTRAVENOUS | Status: DC
  Administered 2024-05-12 – 2024-05-13 (×3): 3 mL via INTRAVENOUS

## 2024-05-11 MED ORDER — ETOMIDATE 2 MG/ML IV SOLN
INTRAVENOUS | Status: AC | PRN
  Administered 2024-05-11: 20 mg via INTRAVENOUS

## 2024-05-11 MED ORDER — SODIUM CHLORIDE 0.9 % WEIGHT BASED INFUSION
1.0000 mL/kg/h | INTRAVENOUS | Status: AC
  Administered 2024-05-11 (×2): 1 mL/kg/h via INTRAVENOUS

## 2024-05-11 MED ORDER — POTASSIUM CHLORIDE 20 MEQ PO PACK
40.0000 meq | PACK | Freq: Once | ORAL | Status: AC
  Administered 2024-05-11: 40 meq
  Filled 2024-05-11: qty 2

## 2024-05-11 MED ORDER — CANGRELOR BOLUS VIA INFUSION
INTRAVENOUS | Status: DC | PRN
  Administered 2024-05-11: 3390 ug via INTRAVENOUS

## 2024-05-11 MED ORDER — FENTANYL CITRATE (PF) 100 MCG/2ML IJ SOLN
INTRAMUSCULAR | Status: AC
  Filled 2024-05-11: qty 2

## 2024-05-11 MED ORDER — ORAL CARE MOUTH RINSE: 15.0000 mL | OROMUCOSAL | Status: DC | PRN

## 2024-05-11 MED ORDER — PRASUGREL HCL 10 MG PO TABS: 10.0000 mg | ORAL_TABLET | Freq: Every day | ORAL | Status: DC

## 2024-05-11 MED ORDER — CALCIUM CHLORIDE 10 % IV SOLN
INTRAVENOUS | Status: AC | PRN
  Administered 2024-05-11: 1 g via INTRAVENOUS

## 2024-05-11 MED ORDER — PROSOURCE TF20 ENFIT COMPATIBL EN LIQD
60.0000 mL | Freq: Four times a day (QID) | ENTERAL | Status: DC
  Administered 2024-05-11 (×3): 60 mL
  Filled 2024-05-11 (×5): qty 60

## 2024-05-11 MED ORDER — AMIODARONE IV BOLUS ONLY 150 MG/100ML
INTRAVENOUS | Status: AC | PRN
  Administered 2024-05-11: 150 mg via INTRAVENOUS

## 2024-05-11 MED ORDER — PROPOFOL 1000 MG/100ML IV EMUL
0.0000 ug/kg/min | INTRAVENOUS | Status: DC
  Administered 2024-05-11 (×3): 50 ug/kg/min via INTRAVENOUS
  Administered 2024-05-11: 40 ug/kg/min via INTRAVENOUS
  Administered 2024-05-11: 50 ug/kg/min via INTRAVENOUS
  Administered 2024-05-11: 40 ug/kg/min via INTRAVENOUS
  Administered 2024-05-12 (×3): 50 ug/kg/min via INTRAVENOUS
  Administered 2024-05-12: 40 ug/kg/min via INTRAVENOUS
  Filled 2024-05-11 (×10): qty 100

## 2024-05-11 MED ORDER — HEPARIN SODIUM (PORCINE) 5000 UNIT/ML IJ SOLN
INTRAMUSCULAR | Status: AC
  Administered 2024-05-11: 4000 [IU]
  Filled 2024-05-11: qty 1

## 2024-05-11 MED ORDER — AMIODARONE LOAD VIA INFUSION
150.0000 mg | Freq: Once | INTRAVENOUS | Status: AC
  Administered 2024-05-12: 150 mg via INTRAVENOUS
  Filled 2024-05-11: qty 83.34

## 2024-05-11 MED ORDER — AMIODARONE HCL IN DEXTROSE 360-4.14 MG/200ML-% IV SOLN
30.0000 mg/h | INTRAVENOUS | Status: DC
  Administered 2024-05-11 – 2024-05-14 (×8): 30 mg/h via INTRAVENOUS
  Filled 2024-05-11 (×8): qty 200

## 2024-05-11 MED ORDER — LIDOCAINE HCL (PF) 1 % IJ SOLN
INTRAMUSCULAR | Status: DC | PRN
  Administered 2024-05-11: 2 mL

## 2024-05-11 SURGICAL SUPPLY — 21 items
BALLOON TREK RX 2.5X15 (BALLOONS) IMPLANT
BALLOON ~~LOC~~ EUPHORA RX 3.25X20 (BALLOONS) IMPLANT
BALLOON ~~LOC~~ TREK NEO RX 3.5X12 (BALLOONS) IMPLANT
CANISTER PENUMBRA ENGINE (MISCELLANEOUS) IMPLANT
CATH INDIGO CAT RX KIT (CATHETERS) IMPLANT
CATH INFINITI 5FR ANG PIGTAIL (CATHETERS) IMPLANT
CATH INFINITI JR4 5F (CATHETERS) IMPLANT
CATH VISTA GUIDE 6FR XB3.5 EPK (CATHETERS) IMPLANT
DEVICE RAD TR BAND REGULAR (VASCULAR PRODUCTS) IMPLANT
DRAPE BRACHIAL (DRAPES) IMPLANT
GLIDESHEATH SLEND SS 6F .021 (SHEATH) IMPLANT
GUIDEWIRE INQWIRE 1.5J.035X260 (WIRE) IMPLANT
KIT ENCORE 26 ADVANTAGE (KITS) IMPLANT
PACK CARDIAC CATH (CUSTOM PROCEDURE TRAY) ×1 IMPLANT
SET ATX-X65L (MISCELLANEOUS) IMPLANT
STATION PROTECTION PRESSURIZED (MISCELLANEOUS) IMPLANT
STENT ONYX FRONTIER 2.75X26 (Permanent Stent) IMPLANT
TUBING CIL FLEX 10 FLL-RA (TUBING) IMPLANT
WIRE ASAHI PROWATER 180CM (WIRE) IMPLANT
WIRE GUIDERIGHT .035X150 (WIRE) IMPLANT
WIRE RUNTHROUGH .014X180CM (WIRE) IMPLANT

## 2024-05-11 NOTE — ED Notes (Signed)
 Pt to cath lab.

## 2024-05-11 NOTE — Progress Notes (Signed)
   05/11/24 1030  Spiritual Encounters  Type of Visit Follow up  Care provided to: Pt and family  Conversation partners present during encounter Nurse  Reason for visit Routine spiritual support  OnCall Visit Yes   Chaplain returned to family to offer prayer at the patient's bedside as requested. Chaplain asked staff to see if more family members could stand bedside for the prayer to provide meaning for the family.  Staff approved and Chaplain prayed with family and offered a compassionate presence.    Rev. Rana M. Nicholaus, M.Div. Chaplain Resident [facility]

## 2024-05-11 NOTE — ED Provider Notes (Addendum)
 [facility] Provider Note    Event Date/Time   First MD Initiated Contact with Patient 05/11/24 0256     (approximate)   History   Code STEMI   HPI  Joshua Bartlett is a 43 y.o. male who presents to the ED for evaluation of Code STEMI   Patient brought into the ED from home via EMS for evaluation of a field code STEMI activation.  I am told via EMS that patient awoke in the middle of the night with crushing chest pain.  They transmit patient's EKG which is concerning for STEMI.  En route to the hospital, patient has multiple prolonged episodes of V. tach and polymorphic V-fib.  EMS estimates x6 cardioversion.  Brief, 60-90 seconds estimated, CPR in between multiple shocks for pulseless ventricular dysrhythmia.  No medications prehospital outside of electricity and CPR.  He presents to the ED with pulses intact, stable BP and unresponsive, breathing on his own. History is limited by acuity and his mental status on arrival.  He remains hemodynamically stable here in the ED without shock but has significant ectopy on the monitor.  Initially has repeated brief runs of monomorphic V. tach, 3-7 beats, with couple sinus beats in between.    We provide 150 mg of IV amiodarone, 1 g of IV calcium, 300 mg of rectal aspirin, 4000 units of IV heparin.  He does have 1 episode of polymorphic V-fib without a pulse and we defibrillate with 200 J.  Returns to a sinus rhythm after this.  No CPR.   He remains unresponsive, breathing independently.  We provide 20 mg IV etomidate, 100 mg IV rocuronium to facilitate RSI.  100 mcg fentanyl bolus and 100mcg/hr Fentanyl drip.  2 mg IV Versed on his way up to the Cath Lab.  I consult with STEMI cardiologist on-call, Dr. Ammon, on the phone on patient's arrival and then in person shortly after intubation prior to patient going to Cath Lab emergently.  I update patient's fianc and mother as patient is going up to the Cath Lab.   Discussed critical illness, severity of situation, pending left heart cath.    Physical Exam   Triage Vital Signs: ED Triage Vitals [05/11/24 0249]  Encounter Vitals Group     BP (!) 132/93     Girls Systolic BP Percentile      Girls Diastolic BP Percentile      Boys Systolic BP Percentile      Boys Diastolic BP Percentile      Pulse Rate (!) 117     Resp (!) 27     Temp      Temp src      SpO2 96 %     Weight      Height      Head Circumference      Peak Flow      Pain Score      Pain Loc      Pain Education      Exclude from Growth Chart     Most recent vital signs: Vitals:   05/11/24 0306 05/11/24 0306  BP:  (!) 147/105  Pulse: (!) 108   Resp: 20   SpO2: 93%     General: Unresponsive, diaphoretic and breathing independently CV:  Good peripheral perfusion.  Resp:  Normal effort.  Abd:  No distention.  MSK:  No deformity noted.  No signs of trauma or lower extremity swelling Neuro:  No focal deficits appreciated.  No  seizure activity Other:     ED Results / Procedures / Treatments   Labs (all labs ordered are listed, but only abnormal results are displayed) Labs Reviewed - No data to display  EKG Poor quality EKG seems to demonstrate an anterior septal STEMI with significant ST elevations to V2, V3 and V4 with inferior depressions.  Significant ectopy and PVCs.  RADIOLOGY   Official radiology report(s): No results found.  PROCEDURES and INTERVENTIONS:  .Critical Care  Performed by: Claudene Rover, MD Authorized by: Claudene Rover, MD   Critical care provider statement:    Critical care time (minutes):  30   Critical care time was exclusive of:  Separately billable procedures and treating other patients   Critical care was necessary to treat or prevent imminent or life-threatening deterioration of the following conditions:  Cardiac failure and circulatory failure   Critical care was time spent personally by me on the following activities:   Development of treatment plan with patient or surrogate, discussions with consultants, evaluation of patient's response to treatment, examination of patient, ordering and review of laboratory studies, ordering and review of radiographic studies, ordering and performing treatments and interventions, pulse oximetry, re-evaluation of patient's condition and review of old charts .1-3 Lead EKG Interpretation  Performed by: Claudene Rover, MD Authorized by: Claudene Rover, MD     Interpretation: abnormal     ECG rate:  90   ECG rate assessment: normal     Rhythm: sinus rhythm     Ectopy: PVCs     Conduction: normal   .Cardioversion  Date/Time: 05/11/2024 3:37 AM  Performed by: Claudene Rover, MD Authorized by: Claudene Rover, MD   Consent:    Consent obtained:  Emergent situation Pre-procedure details:    Cardioversion basis:  Emergent   Pre-procedure rhythm: Vfib.   Electrode placement:  Anterior-posterior Attempt one:    Cardioversion mode:  Asynchronous   Shock (Joules):  200   Shock outcome:  Conversion to normal sinus rhythm Post-procedure details:    Patient tolerance of procedure:  Tolerated well, no immediate complications Procedure Name: Intubation Date/Time: 05/11/2024 3:43 AM  Performed by: Claudene Rover, MDPre-anesthesia Checklist: Patient identified, Patient being monitored, Emergency Drugs available, Timeout performed and Suction available Oxygen Delivery Method: Non-rebreather mask Preoxygenation: Pre-oxygenation with 100% oxygen Induction Type: Rapid sequence Ventilation: Mask ventilation without difficulty Laryngoscope Size: Glidescope and 4 Number of attempts: 1 Airway Equipment and Method: Rigid stylet Placement Confirmation: ETT inserted through vocal cords under direct vision, CO2 detector and Breath sounds checked- equal and bilateral Secured at: 23 (teeth) cm Tube secured with: ETT holder      Medications  Heparin  (Porcine) in NaCl 1000-0.9 UT/500ML-% SOLN (500  mLs  Given 05/11/24 0324)  lidocaine  (PF) (XYLOCAINE ) 1 % injection (2 mLs Other Given 05/11/24 0324)  heparin  sodium (porcine) injection (7,000 Units Intravenous Given 05/11/24 0335)  verapamil  (ISOPTIN ) injection (2.5 mg Intravenous Given 05/11/24 0328)  amiodarone  (NEXTERONE ) 1.8 mg/mL load via infusion 150 mg (has no administration in time range)  cangrelor  (KENGREAL ) bolus via infusion (3,390 mcg Intravenous Given 05/11/24 0340)  amiodarone  (NEXTERONE  PREMIX) 360-4.14 MG/200ML-% (1.8 mg/mL) IV infusion (has no administration in time range)    Followed by  amiodarone  (NEXTERONE  PREMIX) 360-4.14 MG/200ML-% (1.8 mg/mL) IV infusion (has no administration in time range)  cangrelor  (KENGREAL ) 50,000 mcg in sodium chloride  0.9 % 250 mL (200 mcg/mL) infusion (4 mcg/kg/min  113 kg (Order-Specific) Intravenous New Bag/Given 05/11/24 0342)  midazolam  (VERSED ) 2 MG/2ML injection (  Given  05/11/24 0304)  amiodarone  (NEXTERONE ) 1.5 mg/mL IV bolus only (150 mg Intravenous New Bag/Given 05/11/24 0253)  etomidate  (AMIDATE ) injection ( Intravenous Canceled Entry 05/11/24 0300)  calcium  chloride injection (1 g Intravenous Given 05/11/24 0256)  fentaNYL  10 mcg/ml infusion (100 mcg/hr  Rate/Dose Verify 05/11/24 0303)  heparin  5000 UNIT/ML injection (4,000 Units  Given 05/11/24 0258)  rocuronium  (ZEMURON ) injection ( Intravenous Canceled Entry 05/11/24 0300)     IMPRESSION / MDM / ASSESSMENT AND PLAN / ED COURSE  I reviewed the triage vital signs and the nursing notes.  Differential diagnosis includes, but is not limited to, STEMI, aortic dissection, PE  {Patient presents with symptoms of an acute illness or injury that is potentially life-threatening.  Patient presents to the ED with signs of a STEMI requiring emergent left heart cath.  See chain of events documented in HPI  Emergently taken to left heart cath.        FINAL CLINICAL IMPRESSION(S) / ED DIAGNOSES   Final diagnoses:  ST elevation  myocardial infarction (STEMI), unspecified artery (HCC)     Rx / DC Orders   ED Discharge Orders     None        Note:  This document was prepared using Dragon voice recognition software and may include unintentional dictation errors.   Claudene Rover, MD 05/11/24 [id number]    Claudene Rover, MD 05/11/24 [phone]

## 2024-05-11 NOTE — Progress Notes (Signed)
 Transported pt to CATH LAB via vent.

## 2024-05-11 NOTE — Telephone Encounter (Unsigned)
 Copied from CRM [id number]. Topic: General - Call Back - No Documentation >> May 11, 2024  9:17 AM Tonda B wrote: Reason for CRM: patient mother is calling and saying that pt had a heart attack this morning and she would like to get some info asap as far as getting the patient transferred to somewhere else please call pt mother back asap [phone]

## 2024-05-11 NOTE — Progress Notes (Signed)
 Paged for Code stemi around 3am-introduced self and chaplain services to family (mother and wife) both extremely tearful. Walked them to Cath Lab waiting room-notified Cath lab where family was. Family appreciative of chaplain support but denied any immediate needs. Pt's brother now with family in waiting room and pts sister is en route arrival should be around 4:45 am.   Offered a compassionate presence and listening ear-have checked in multiple times with family since 3 am-no new needs. Family has water  and tissues. Please call if family changes their minds and would like support
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
## 2024-05-11 NOTE — ED Notes (Signed)
 Aspirin  given

## 2024-05-11 NOTE — Progress Notes (Signed)
 Transported pt to ICU bed #3 via vent with no issues.

## 2024-05-11 NOTE — Procedures (Signed)
 Central Venous Catheter Insertion Procedure Note  DAMEK ENDE  [id number]  Sep 26, 1981  Date:05/11/24  Time:5:52 AM   Provider Performing:Hero Kulish L Rust-Chester   Procedure: Insertion of Non-tunneled Central Venous Catheter(36556) with US  guidance (23062)   Indication(s) Difficult access  Consent Unable to obtain consent due to emergent nature of procedure.  Anesthesia Topical only with 1% lidocaine  , propofol  and fentanyl  drips  Timeout Verified patient identification, verified procedure, site/side was marked, verified correct patient position, special equipment/implants available, medications/allergies/relevant history reviewed, required imaging and test results available.  Sterile Technique Maximal sterile technique including full sterile barrier drape, hand hygiene, sterile gown, sterile gloves, mask, hair covering, sterile ultrasound probe cover (if used).  Procedure Description Area of catheter insertion was cleaned with chlorhexidine  and draped in sterile fashion.  With real-time ultrasound guidance a central venous catheter was placed into the right internal jugular vein. Nonpulsatile blood flow and easy flushing noted in all ports.  The catheter was sutured in place and sterile dressing applied.  Complications/Tolerance None; patient tolerated the procedure well. Chest X-ray is ordered to verify placement for internal jugular or subclavian cannulation.   Chest x-ray is not ordered for femoral cannulation.  EBL Minimal  Specimen(s) None   Jenita Jama Meek, AGACNP-BC Acute Care Nurse Practitioner [facility]   [phone] / [phone] Please see Amion for details.
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
## 2024-05-11 NOTE — ED Triage Notes (Signed)
 Bib ems code stemi, pt cardioverted 2 times, coded in route rosc obtained, 5-6 defibs, pt poorly responsive, diaphoretic on arrival   No meds pta
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
## 2024-05-11 NOTE — Progress Notes (Signed)
   05/11/24 0930  Spiritual Encounters  Type of Visit Attempt (pt unavailable)  Care provided to: Family  Referral source Chaplain assessment  Reason for visit Routine spiritual support  OnCall Visit Yes   Overnight Chaplain shared with Chaplain that a visit to this family may be beneficial.  Chaplain went to patient's room and saw family there speaking with the Dr.   Elia spoke to staff and made her presence known.  Staff shared information about the patient and family.   Chaplain went to the waiting room and found more family there and offered her support and a compassionate presence.   Chaplain asked what their needs are and they asked if Chaplain could pray with the family in the patient's room.  Chaplain said she'd return a little later to give the staff time to finish speaking with the family.    Rev. Rana M. Nicholaus, M.Div. Chaplain Resident [facility]

## 2024-05-11 NOTE — Telephone Encounter (Signed)
 Patients mom called to let provider know patient had a heart attack this morning and is not doing well. Hospital said he could not stable enough to be moved as he is on a ventilator. Mom is asking that his chart be reviewed and provider f/u with her asap. Please call mom at [phone]

## 2024-05-11 NOTE — Progress Notes (Addendum)
 9389 Hematoma felt around central line insertion site. Britton lee Rust chester NP called to bedsie to access. Manual pressure applied to site for . Site no longer ozing and felt softer. NP back to bed to assess. Pressure dressing applied to site and xray ordered. After xray NP rust chester okayed use of central line and to monitor the site for any changes.   Primary nurse aware

## 2024-05-11 NOTE — Progress Notes (Signed)
 eLink Physician-Brief Progress Note Patient Name: Joshua Bartlett DOB: 07-11-1981 MRN: [id number]   Date of Service  05/11/2024  HPI/Events of Note  50M admitted for OOH cardiac arrest with VT/VF s/p multiple CV. In ED anterior STEMI. Cards consulted and underwent PCI to LAD  eICU Interventions   -Admit to ICU -Vent support -Cardiology following. Follow DAPT recommendations -Continue Amio -BB, statin, aspiriin -Echo     Intervention Category Evaluation Type: New Patient Evaluation  Khloe Hunkele Slater Staff 05/11/2024, 5:18 AM

## 2024-05-11 NOTE — Progress Notes (Signed)
 Echocardiogram 2D Echocardiogram has been performed.  Joshua Bartlett 05/11/2024, 4:05 PM

## 2024-05-12 ENCOUNTER — Inpatient Hospital Stay

## 2024-05-12 LAB — ECHOCARDIOGRAM COMPLETE
AR max vel: 3.08 cm2
AV Peak grad: 2.6 mmHg
Ao pk vel: 0.81 m/s
Area-P 1/2: 3.42 cm2
Height: 74.016 in
S' Lateral: 4.8 cm
Weight: 4035.3 [oz_av]

## 2024-05-12 LAB — RENAL FUNCTION PANEL
Albumin: 3.2 g/dL — ABNORMAL LOW (ref 3.5–5.0)
Anion gap: 5 (ref 5–15)
BUN: 16 mg/dL (ref 6–20)
CO2: 26 mmol/L (ref 22–32)
Calcium: 8.3 mg/dL — ABNORMAL LOW (ref 8.9–10.3)
Chloride: 108 mmol/L (ref 98–111)
Creatinine, Ser: 0.75 mg/dL (ref 0.61–1.24)
GFR, Estimated: >60 mL/min (ref >60)
Glucose, Bld: 136 mg/dL — ABNORMAL HIGH (ref 70–99)
Phosphorus: 2.8 mg/dL (ref 2.5–4.6)
Potassium: 4.1 mmol/L (ref 3.5–5.1)
Sodium: 139 mmol/L (ref 135–145)

## 2024-05-12 LAB — THYROID PANEL WITH TSH
Free Thyroxine Index: 2 (ref 1.2–4.9)
T3 Uptake Ratio: 27 % (ref 24–39)
T4, Total: 7.4 ug/dL (ref 4.5–12.0)
TSH: 3.63 u[IU]/mL (ref 0.450–4.500)

## 2024-05-12 LAB — ANA COMPREHENSIVE PANEL

## 2024-05-12 LAB — TRIGLYCERIDES: Triglycerides: 138 mg/dL (ref <150)

## 2024-05-12 LAB — GLUCOSE, CAPILLARY
Glucose-Capillary: 104 mg/dL — ABNORMAL HIGH (ref 70–99)
Glucose-Capillary: 104 mg/dL — ABNORMAL HIGH (ref 70–99)
Glucose-Capillary: 116 mg/dL — ABNORMAL HIGH (ref 70–99)
Glucose-Capillary: 122 mg/dL — ABNORMAL HIGH (ref 70–99)
Glucose-Capillary: 125 mg/dL — ABNORMAL HIGH (ref 70–99)

## 2024-05-12 LAB — CBC
HCT: 36.2 % — ABNORMAL LOW (ref 39.0–52.0)
Hemoglobin: 11.5 g/dL — ABNORMAL LOW (ref 13.0–17.0)
MCH: 30.7 pg (ref 26.0–34.0)
MCHC: 31.8 g/dL (ref 30.0–36.0)
MCV: 96.8 fL (ref 80.0–100.0)
Platelets: 225 K/uL (ref 150–400)
RBC: 3.74 MIL/uL — ABNORMAL LOW (ref 4.22–5.81)
RDW: 12.3 % (ref 11.5–15.5)
WBC: 11.7 K/uL — ABNORMAL HIGH (ref 4.0–10.5)
nRBC: 0 % (ref 0.0–0.2)

## 2024-05-12 LAB — MAGNESIUM
Magnesium: 1.9 mg/dL (ref 1.7–2.4)
Magnesium: 1.8 mg/dL (ref 1.7–2.4)
Magnesium: 2.2 mg/dL (ref 1.7–2.4)

## 2024-05-12 LAB — POTASSIUM
Potassium: 3.2 mmol/L — ABNORMAL LOW (ref 3.5–5.1)
Potassium: 3.8 mmol/L (ref 3.5–5.1)

## 2024-05-12 LAB — PHOSPHORUS
Phosphorus: 1.3 mg/dL — ABNORMAL LOW (ref 2.5–4.6)
Phosphorus: 4.2 mg/dL (ref 2.5–4.6)

## 2024-05-12 LAB — CKMB (ARMC ONLY): CK, MB: 251.3 ng/mL — ABNORMAL HIGH (ref 0.5–5.0)

## 2024-05-12 LAB — HEPATIC FUNCTION PANEL
ALT: 83 U/L — ABNORMAL HIGH (ref 0–44)
AST: 430 U/L — ABNORMAL HIGH (ref 15–41)
Albumin: 3.1 g/dL — ABNORMAL LOW (ref 3.5–5.0)
Alkaline Phosphatase: 41 U/L (ref 38–126)
Bilirubin, Direct: <0.1 mg/dL (ref 0.0–0.2)
Total Bilirubin: 0.2 mg/dL (ref 0.0–1.2)
Total Protein: 5.8 g/dL — ABNORMAL LOW (ref 6.5–8.1)

## 2024-05-12 LAB — HEMOGLOBIN A1C
Hgb A1c MFr Bld: 5.1 % (ref 4.8–5.6)
Mean Plasma Glucose: 100 mg/dL

## 2024-05-12 LAB — LIPOPROTEIN A (LPA): Lipoprotein (a): 98.6 nmol/L — ABNORMAL HIGH (ref <75.0)

## 2024-05-12 LAB — TROPONIN I (HIGH SENSITIVITY): Troponin I (High Sensitivity): >24000 ng/L (ref <18)

## 2024-05-12 MED ORDER — TICAGRELOR 90 MG PO TABS
90.0000 mg | ORAL_TABLET | Freq: Two times a day (BID) | ORAL | Status: DC
  Administered 2024-05-13 – 2024-05-14 (×3): 90 mg via ORAL
  Filled 2024-05-12 (×3): qty 1

## 2024-05-12 MED ORDER — LISINOPRIL 5 MG PO TABS
5.0000 mg | ORAL_TABLET | Freq: Two times a day (BID) | ORAL | Status: DC
  Administered 2024-05-12: 5 mg
  Filled 2024-05-12: qty 1

## 2024-05-12 MED ORDER — PROMETHAZINE (PHENERGAN) 6.25MG IN NS 50ML IVPB
6.2500 mg | Freq: Once | INTRAVENOUS | Status: AC
  Administered 2024-05-12: 6.25 mg via INTRAVENOUS
  Filled 2024-05-12: qty 6.25

## 2024-05-12 MED ORDER — DEXMEDETOMIDINE HCL IN NACL 400 MCG/100ML IV SOLN
0.0000 ug/kg/h | INTRAVENOUS | Status: DC
  Administered 2024-05-12: 0.4 ug/kg/h via INTRAVENOUS
  Filled 2024-05-12: qty 100

## 2024-05-12 MED ORDER — METOPROLOL SUCCINATE ER 50 MG PO TB24
25.0000 mg | ORAL_TABLET | Freq: Two times a day (BID) | ORAL | Status: DC
  Administered 2024-05-12 – 2024-05-13 (×3): 25 mg via ORAL
  Filled 2024-05-12 (×3): qty 1

## 2024-05-12 MED ORDER — FAMOTIDINE 20 MG PO TABS
20.0000 mg | ORAL_TABLET | Freq: Two times a day (BID) | ORAL | Status: DC
  Administered 2024-05-12: 20 mg
  Filled 2024-05-12: qty 1

## 2024-05-12 MED ORDER — AMIODARONE IV BOLUS ONLY 150 MG/100ML: 150.0000 mg | Freq: Once | INTRAVENOUS | Status: DC

## 2024-05-12 MED ORDER — POTASSIUM PHOSPHATES 15 MMOLE/5ML IV SOLN
45.0000 mmol | Freq: Once | INTRAVENOUS | Status: AC
  Administered 2024-05-12: 45 mmol via INTRAVENOUS
  Filled 2024-05-12: qty 15

## 2024-05-12 MED ORDER — LISINOPRIL 5 MG PO TABS: 2.5000 mg | ORAL_TABLET | Freq: Every day | ORAL | Status: DC

## 2024-05-12 MED ORDER — MORPHINE SULFATE (PF) 2 MG/ML IV SOLN
2.0000 mg | Freq: Once | INTRAVENOUS | Status: AC
  Administered 2024-05-12: 2 mg via INTRAVENOUS
  Filled 2024-05-12: qty 1

## 2024-05-12 MED ORDER — ASPIRIN 81 MG PO CHEW: 81.0000 mg | CHEWABLE_TABLET | Freq: Every day | ORAL | Status: DC

## 2024-05-12 MED ORDER — HYDRALAZINE HCL 20 MG/ML IJ SOLN: 10.0000 mg | Freq: Four times a day (QID) | INTRAMUSCULAR | Status: DC | PRN

## 2024-05-12 MED ORDER — MORPHINE SULFATE (PF) 2 MG/ML IV SOLN
1.0000 mg | INTRAVENOUS | Status: DC | PRN
  Administered 2024-05-12: 2 mg via INTRAVENOUS
  Administered 2024-05-12: 1 mg via INTRAVENOUS
  Filled 2024-05-12 (×2): qty 1

## 2024-05-12 MED ORDER — LIDOCAINE 5 % EX PTCH
1.0000 | MEDICATED_PATCH | CUTANEOUS | Status: DC
  Administered 2024-05-12 – 2024-05-14 (×3): 1 via TRANSDERMAL
  Filled 2024-05-12 (×3): qty 1

## 2024-05-12 MED ORDER — ATORVASTATIN CALCIUM 80 MG PO TABS: 80.0000 mg | ORAL_TABLET | Freq: Every day | ORAL | Status: DC

## 2024-05-12 MED ORDER — EMPAGLIFLOZIN 10 MG PO TABS
10.0000 mg | ORAL_TABLET | Freq: Every day | ORAL | Status: DC
  Administered 2024-05-12 – 2024-05-14 (×3): 10 mg via ORAL
  Filled 2024-05-12 (×3): qty 1

## 2024-05-12 MED ORDER — TICAGRELOR 90 MG PO TABS
180.0000 mg | ORAL_TABLET | Freq: Once | ORAL | Status: AC
  Administered 2024-05-12: 180 mg via ORAL
  Filled 2024-05-12: qty 2

## 2024-05-12 MED ORDER — FENTANYL CITRATE PF 50 MCG/ML IJ SOSY
50.0000 ug | PREFILLED_SYRINGE | INTRAMUSCULAR | Status: DC | PRN
  Administered 2024-05-12 – 2024-05-14 (×14): 50 ug via INTRAVENOUS
  Filled 2024-05-12 (×15): qty 1

## 2024-05-12 MED ORDER — HYDRALAZINE HCL 20 MG/ML IJ SOLN
10.0000 mg | Freq: Once | INTRAMUSCULAR | Status: AC
  Administered 2024-05-12: 10 mg via INTRAVENOUS
  Filled 2024-05-12: qty 1

## 2024-05-12 MED ORDER — ORAL CARE MOUTH RINSE: 15.0000 mL | OROMUCOSAL | Status: DC | PRN

## 2024-05-12 MED ORDER — TICAGRELOR 90 MG PO TABS: 180.0000 mg | ORAL_TABLET | Freq: Once | ORAL | Status: DC

## 2024-05-12 MED ORDER — MAGNESIUM SULFATE 2 GM/50ML IV SOLN
2.0000 g | Freq: Once | INTRAVENOUS | Status: AC
  Administered 2024-05-12: 2 g via INTRAVENOUS
  Filled 2024-05-12: qty 50

## 2024-05-12 MED ORDER — ALBUTEROL SULFATE (2.5 MG/3ML) 0.083% IN NEBU: 2.5000 mg | INHALATION_SOLUTION | Freq: Once | RESPIRATORY_TRACT | Status: DC

## 2024-05-12 MED ORDER — ACETAMINOPHEN 10 MG/ML IV SOLN
1000.0000 mg | Freq: Four times a day (QID) | INTRAVENOUS | Status: AC
  Administered 2024-05-12 – 2024-05-13 (×4): 1000 mg via INTRAVENOUS
  Filled 2024-05-12 (×5): qty 100

## 2024-05-12 MED ORDER — LIDOCAINE 5 % EX PTCH
1.0000 | MEDICATED_PATCH | CUTANEOUS | Status: DC
  Administered 2024-05-12: 1 via TRANSDERMAL
  Filled 2024-05-12: qty 1

## 2024-05-12 NOTE — Progress Notes (Signed)
 Pt extubated without complications, placed on 2lpm [place], sats 97%, respiratory rate 22/min.

## 2024-05-12 NOTE — Plan of Care (Signed)
  Problem: Clinical Measurements: Goal: Respiratory complications will improve Outcome: Progressing Goal: Cardiovascular complication will be avoided Outcome: Progressing   Problem: Coping: Goal: Level of anxiety will decrease Outcome: Progressing   Problem: Pain Managment: Goal: General experience of comfort will improve and/or be controlled Outcome: Progressing   Problem: Education: Goal: Knowledge of General Education information will improve Description: Including pain rating scale, medication(s)/side effects and non-pharmacologic comfort measures Outcome: Not Progressing   Problem: Health Behavior/Discharge Planning: Goal: Ability to manage health-related needs will improve Outcome: Not Progressing

## 2024-05-12 NOTE — Progress Notes (Addendum)
 1000- patient complaining of nausea, chest pain and back pain. NP Lonell Moose notified. Zofran  given without relief. EKG obtained. Morphine  given for pain w/ some relief. Phenergan  and lidocaine  patch ordered.     1211- patient continues to have squeezing chest pain along with increased non sustained vtach. Lonell Moose NP and Caralyn Hudson PA notified. EKG obtained, amio bolus given per order. Additional morphine  dose given for pain per order.   1400- patient continues to have chest pain. Dr. Parris notified. Morphine  given. Patient not appropriate for swallow screen at this time due to nausea and lethargy.

## 2024-05-12 NOTE — Progress Notes (Signed)
 [facility] CLINIC CARDIOLOGY PROGRESS NOTE   Patient ID: AMAIR SHROUT MRN: [id number] DOB/AGE: January 18, 1981 43 y.o.  Admit date: 05/11/2024 Referring Physician None - STEMI patient Primary Physician Myrla Jon HERO, MD  Primary Cardiologist None Reason for Consultation Anterior STEMI  HPI: Joshua Bartlett is a 43 y.o. male with a past medical history of incarcerated hernia, nicotine use, marijuana use, social alcohol use who presented to the ED on 05/11/2024 for chest pain, anterior STEMI and ventricular tachycardia/fibrillation requiring multiple defibrillations.  He was intubated in the ED and taken emergently to the Cath Lab. Angiography revealed occluded proximal/mid LAD, s/p successful DES placement proximal/mid LAD.  Started on IV amiodarone , cangrelor  and admitted to the ICU.  Was initially on Aggrastat  however this was stopped after he developed hematoma at site of IJ central line.  Interval History:  - Patient seen and examined this morning with fianc, niece, mother present at bedside.  He remains intubated, sedated. - Occurrence of ventricular ectopy has significantly reduced over the last 24 hours.  Maintaining in normal sinus rhythm on telemetry. - BP remains stable, not requiring any vasopressor support. - Normal renal function with good urine output noted.  Review of systems complete and found to be negative unless listed above   Vitals:   05/12/24 0600 05/12/24 0700 05/12/24 0800 05/12/24 0900  BP: 107/80 103/75 107/74 110/75  Pulse: 86 84 84 86  Resp: 19 (!) 21 19 20   Temp: 98.6 F (37 C) 98.6 F (37 C) 98.6 F (37 C) 98.8 F (37.1 C)  TempSrc:   Esophageal   SpO2: 96% 97% 95% 95%  Weight:      Height:         Intake/Output Summary (Last 24 hours) at 05/12/2024 0934 Last data filed at 05/12/2024 0800 Gross per 24 hour  Intake 4849.71 ml  Output 2790 ml  Net 2059.71 ml     PHYSICAL EXAM General: Critically ill appearing young male, well nourished, in no  acute distress. HEENT: Normocephalic and atraumatic. Neck: No JVD.  Lungs: Intubated on mechanical ventilation. Heart: HRRR. Normal S1 and S2 without gallops or murmurs. Radial & DP pulses 2+ bilaterally. Abdomen: Non-distended appearing.  Msk: Normal strength and tone for age. Extremities: No clubbing, cyanosis or edema.     LABS: Basic Metabolic Panel: Recent Labs    05/11/24 2251 05/12/24 0500  NA 138 139  K 3.8 4.1  CL 109 108  CO2 25 26  GLUCOSE 113* 136*  BUN 16 16  CREATININE 0.83 0.75  CALCIUM  8.0* 8.3*  MG 2.0 1.9  PHOS 4.3 2.8   Liver Function Tests: Recent Labs    05/11/24 2251 05/12/24 0500  AST 478* 430*  ALT 81* 83*  ALKPHOS 41 41  BILITOT 0.7 0.2  PROT 5.8* 5.8*  ALBUMIN 3.1* 3.1*  3.2*   No results for input(s): LIPASE, AMYLASE in the last 72 hours. CBC: Recent Labs    05/11/24 0430 05/11/24 0808 05/11/24 1247 05/12/24 0500  WBC 22.0* 15.2*  --  11.7*  NEUTROABS 18.0*  --   --   --   HGB 12.8* 13.0 12.4* 11.5*  HCT 39.1 39.8 38.2* 36.2*  MCV 93.3 93.6  --  96.8  PLT 345 330  --  225   Cardiac Enzymes: Recent Labs    05/11/24 0430 05/12/24 0500  TROPONINIHS >24,000* >24,000*   BNP: Recent Labs    05/11/24 0430  BNP 3.4   D-Dimer: No results for input(s): DDIMER in  the last 72 hours. Hemoglobin A1C: Recent Labs    05/11/24 0530  HGBA1C 5.1   Fasting Lipid Panel: Recent Labs    05/11/24 0530 05/12/24 0500  CHOL 195  --   HDL 44  --   LDLCALC 136*  --   TRIG 75 138  CHOLHDL 4.4  --    Thyroid  Function Tests: Recent Labs    05/11/24 0530  TSH 3.630  T4TOTAL 7.4   Anemia Panel: No results for input(s): VITAMINB12, FOLATE, FERRITIN, TIBC, IRON, RETICCTPCT in the last 72 hours.  US  Abdomen Limited RUQ (LIVER/GB) Result Date: 05/12/2024 CLINICAL DATA:  Elevated liver function tests EXAM: ULTRASOUND ABDOMEN LIMITED RIGHT UPPER QUADRANT COMPARISON:  None Available. FINDINGS: Gallbladder: No  gallstones or wall thickening visualized. No sonographic Murphy sign noted by sonographer. Common bile duct: Diameter: 5 mm Liver: No focal lesion identified. Within normal limits in parenchymal echogenicity. Portal vein is patent on color Doppler imaging with normal direction of blood flow towards the liver. Other: None. IMPRESSION: 1. Unremarkable right upper quadrant ultrasound. Electronically Signed   By: Ozell Daring M.D.   On: 05/12/2024 08:49   DG Chest Port 1 View Result Date: 05/12/2024 CLINICAL DATA:  Acute respiratory failure, hypoxia EXAM: PORTABLE CHEST 1 VIEW COMPARISON:  05/11/2024 FINDINGS: Single frontal view of the chest demonstrates endotracheal tube overlying tracheal air column, tip 6.3 cm above carina. Right internal jugular catheter tip overlies superior vena cava. Enteric catheter passes below diaphragm, tip excluded by collimation. Esophageal temperature probe overlies the midthoracic esophagus. Cardiac silhouette is stable. No airspace disease, effusion, or pneumothorax. No acute bony abnormalities. IMPRESSION: 1. Endotracheal tube identified, tip now 6.3 cm above carina. Recommend advancing 3-4 cm. 2. Remaining sore devices as above. 3. No acute intrathoracic process. Electronically Signed   By: Ozell Daring M.D.   On: 05/12/2024 08:48   CARDIAC CATHETERIZATION Result Date: 05/11/2024   Prox LAD to Mid LAD lesion is 100% stenosed.   A drug-eluting stent was successfully placed using a STENT ONYX FRONTIER 2.75X26.   Post intervention, there is a 0% residual stenosis.   There is moderate left ventricular systolic dysfunction.   The left ventricular ejection fraction is 25-35% by visual estimate. 1.  Anterior STEMI complicated by VT/VF 2.  Occluded proximal/mid LAD 3.  Moderate reduced left ventricular function with LV ejection fraction estimated be 30% with anterior apical akinesis 4.  Successful primary PCI with 2.75 x 26 mm Onyx frontier drug-eluting stent proximal/mid LAD  Recommendations 1.  Continue IV cangrelor  until patient extubated then start aspirin  325 mg loading dose followed by 81 mg daily, and 60 mg loading dose prasugrel  followed by 10 mg daily 2.  Continue IV Aggrastat  x 18 hours 3.  Continue IV amiodarone  4.  Start metoprolol  succinate 25-50 mg daily after extubation 5.  Start atorvastatin  80 mg daily after extubation 6.  2D echocardiogram   DG Chest Port 1 View Result Date: 05/11/2024 EXAM: 1 VIEW XRAY OF THE CHEST 05/11/2024 06:54:48 AM COMPARISON: Earlier today at 5:59 am. CLINICAL HISTORY: Encounter for central line placement. FINDINGS: LUNGS AND PLEURA: No pneumothorax identified. No signs of pleural effusion. No airspace consolidation. HEART AND MEDIASTINUM: No acute abnormality of the cardiac and mediastinal silhouettes. BONES AND SOFT TISSUES: Visualized osseous structures appear grossly intact. LINES AND TUBES: There is a right IJ catheter with tip at the superior cavoatrial junction. ET tube tip is 4 cm above the carina. Enteric tube courses below the GE junction. IMPRESSION: 1.  ET tube, enteric tube, and right IJ catheter in appropriate positions. 2. No pneumothorax identified. Electronically signed by: Waddell Calk MD 05/11/2024 07:26 AM EDT RP Workstation: [id number]   DG Chest Port 1 View Result Date: 05/11/2024 CLINICAL DATA:  Central line placement. EXAM: PORTABLE CHEST 1 VIEW COMPARISON:  05/11/2024 FINDINGS: Low volume film. Endotracheal tube tip is 4.8 cm above the base of the carina. The NG tube passes into the stomach although the distal tip position is not included on the film. Right IJ central line tip overlies the lower SVC level near the junction with the RA. Cardiopericardial silhouette is at upper limits of normal for size. Insert no consolidation no overt airspace pulmonary edema. IMPRESSION: Right IJ central line tip overlies the lower SVC level near the junction with the RA. No pneumothorax or pleural effusion. Electronically Signed    By: Camellia Candle M.D.   On: 05/11/2024 06:55   DG Abd 1 View Result Date: 05/11/2024 EXAM: 1 VIEW XRAY OF THE ABDOMEN 05/11/2024 05:29:00 AM COMPARISON: None available CLINICAL HISTORY: [id number] Encounter for orogastric (OG) tube placement [id number]. ET tube and OG tube placement FINDINGS: BOWEL: Nonobstructive bowel gas pattern. Gaseous distention of the stomach noted. No dilated bowel loops. SOFT TISSUES: No abnormal calcifications. No opaque urinary calculi. Excreted contrast material noted within the collecting systems of both kidneys. BONES: No acute osseous abnormality. LINES AND TUBES: The ET tube tip and side port are well below the level of the GE junction. Side port is likely within the proximal body of the stomach and the tip is in the distal body. IMPRESSION: 1. ET tube and OG tube in appropriate position. 2. Gaseous distention of the stomach. 3. No bowel obstruction. Electronically signed by: Waddell Calk MD 05/11/2024 06:31 AM EDT RP Workstation: [id number]   DG Chest Port 1 View Result Date: 05/11/2024 EXAM: 1 VIEW XRAY OF THE CHEST 05/11/2024 05:29:00 AM COMPARISON: 04/06/2019 CLINICAL HISTORY: [id number] Intubation of airway performed without difficulty [id number]. ET tube and OG tube placement FINDINGS: LUNGS AND PLEURA: Low lung volumes. No significant pleural effusion or pneumothorax. No airspace consolidation. HEART AND MEDIASTINUM: Stable cardiomediastinal contours. BONES AND SOFT TISSUES: No acute osseous abnormality. LINES AND TUBES: ET tube with tip 4.8 cm above the carina. Enteric tube is identified which courses below the level of the hemidiaphragms. IMPRESSION: 1. Status post intubation with ET tube tip 4.8 cm above the carina. 2. No acute cardiopulmonary pathology. Electronically signed by: Waddell Calk MD 05/11/2024 06:29 AM EDT RP Workstation: [id number]     ECHO pending review  TELEMETRY reviewed by me 05/12/24: NSR rate 80s, occasional PVCs  EKG reviewed by me 05/12/24: Sinus  rhythm with PVCs, baseline artifact making interpretation difficult  DATA reviewed by me 05/12/24: last 24h vitals tele labs imaging I/O, PCCM notes  Principal Problem:   Acute ST elevation myocardial infarction (STEMI) involving left anterior descending (LAD) coronary artery (HCC)    ASSESSMENT AND PLAN: LAVONTE PALOS is a 43 y.o. male with a past medical history of incarcerated hernia, nicotine use, marijuana use, social alcohol use who presented to the ED on 05/11/2024 for chest pain, anterior STEMI and ventricular tachycardia/fibrillation requiring multiple defibrillations.  He was intubated in the ED and taken emergently to the Cath Lab. Angiography revealed occluded proximal/mid LAD, s/p successful DES placement proximal/mid LAD.  Started on IV amiodarone , cangrelor  and admitted to the ICU.  Was initially on Aggrastat  however this was stopped after he developed hematoma at site of IJ central  line.  # Anterior STEMI # Cardiac arrest # Ischemic cardiomypathy # HFrEF Patient with anterior STEMI, cardiac arrest s/p multiple shocks. Underwent LHC with 2.75 x 26 mm Onyx Frontier DES proximal/mid LAD. Remains intubated and sedated. Echo results pending.  -Continue aspirin  81 mg daily, atorvastatin  80 mg daily per tube. -Continue IV amiodarone . Plan to transition to PO once extubated and able to tolerate.  -Continue IV cangrelor . Plan to transition to PO Effient  once extubated and able to tolerate PO. Once able to take meds will load with 60 mg once followed by 10 mg daily.  -Will plan to start GDMT once extubated and able to tolerate PO.  -LifeVest has been approved, patient to be fitted once extubated.   This patient's case was discussed and created with Dr. Florencio and he is in agreement.  Signed:  Danita Bloch, PA-C  05/12/2024, 9:34 AM [facility] Cardiology
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
## 2024-05-13 DIAGNOSIS — I2102 ST elevation (STEMI) myocardial infarction involving left anterior descending coronary artery: Secondary | ICD-10-CM

## 2024-05-13 LAB — RENAL FUNCTION PANEL
Albumin: 3 g/dL — ABNORMAL LOW (ref 3.5–5.0)
Anion gap: 7 (ref 5–15)
BUN: 10 mg/dL (ref 6–20)
CO2: 24 mmol/L (ref 22–32)
Calcium: 8.2 mg/dL — ABNORMAL LOW (ref 8.9–10.3)
Chloride: 107 mmol/L (ref 98–111)
Creatinine, Ser: 0.83 mg/dL (ref 0.61–1.24)
GFR, Estimated: >60 mL/min (ref >60)
Glucose, Bld: 98 mg/dL (ref 70–99)
Phosphorus: 3.8 mg/dL (ref 2.5–4.6)
Potassium: 3.6 mmol/L (ref 3.5–5.1)
Sodium: 138 mmol/L (ref 135–145)

## 2024-05-13 LAB — GLUCOSE, CAPILLARY
Glucose-Capillary: 105 mg/dL — ABNORMAL HIGH (ref 70–99)
Glucose-Capillary: 116 mg/dL — ABNORMAL HIGH (ref 70–99)
Glucose-Capillary: 117 mg/dL — ABNORMAL HIGH (ref 70–99)
Glucose-Capillary: 76 mg/dL (ref 70–99)
Glucose-Capillary: 88 mg/dL (ref 70–99)
Glucose-Capillary: 88 mg/dL (ref 70–99)
Glucose-Capillary: 97 mg/dL (ref 70–99)

## 2024-05-13 LAB — CBC
HCT: 34 % — ABNORMAL LOW (ref 39.0–52.0)
Hemoglobin: 11.2 g/dL — ABNORMAL LOW (ref 13.0–17.0)
MCH: 31.2 pg (ref 26.0–34.0)
MCHC: 32.9 g/dL (ref 30.0–36.0)
MCV: 94.7 fL (ref 80.0–100.0)
Platelets: 212 K/uL (ref 150–400)
RBC: 3.59 MIL/uL — ABNORMAL LOW (ref 4.22–5.81)
RDW: 12.1 % (ref 11.5–15.5)
WBC: 10.9 K/uL — ABNORMAL HIGH (ref 4.0–10.5)
nRBC: 0 % (ref 0.0–0.2)

## 2024-05-13 LAB — MAGNESIUM: Magnesium: 1.9 mg/dL (ref 1.7–2.4)

## 2024-05-13 LAB — TROPONIN I (HIGH SENSITIVITY): Troponin I (High Sensitivity): >24000 ng/L (ref <18)

## 2024-05-13 MED ORDER — ASPIRIN 81 MG PO CHEW
81.0000 mg | CHEWABLE_TABLET | Freq: Every day | ORAL | Status: DC
  Administered 2024-05-13 – 2024-05-14 (×2): 81 mg via ORAL
  Filled 2024-05-13 (×2): qty 1

## 2024-05-13 MED ORDER — LOSARTAN POTASSIUM 25 MG PO TABS
12.5000 mg | ORAL_TABLET | Freq: Every day | ORAL | Status: DC
  Administered 2024-05-13 – 2024-05-14 (×2): 12.5 mg via ORAL
  Filled 2024-05-13 (×2): qty 0.5

## 2024-05-13 MED ORDER — LISINOPRIL 5 MG PO TABS: 5.0000 mg | ORAL_TABLET | Freq: Two times a day (BID) | ORAL | Status: DC

## 2024-05-13 MED ORDER — ATORVASTATIN CALCIUM 80 MG PO TABS
80.0000 mg | ORAL_TABLET | Freq: Every day | ORAL | Status: DC
  Administered 2024-05-13 – 2024-05-14 (×2): 80 mg via ORAL
  Filled 2024-05-13 (×2): qty 1

## 2024-05-13 MED ORDER — FAMOTIDINE 20 MG PO TABS
20.0000 mg | ORAL_TABLET | Freq: Two times a day (BID) | ORAL | Status: DC
  Administered 2024-05-13 – 2024-05-14 (×3): 20 mg via ORAL
  Filled 2024-05-13 (×3): qty 1

## 2024-05-13 MED ORDER — MAGNESIUM SULFATE 2 GM/50ML IV SOLN
2.0000 g | Freq: Once | INTRAVENOUS | Status: AC
  Administered 2024-05-13: 2 g via INTRAVENOUS
  Filled 2024-05-13: qty 50

## 2024-05-13 NOTE — Evaluation (Addendum)
 Occupational Therapy Evaluation Patient Details Name: Joshua Bartlett MRN: [id number] DOB: 1980/12/01 Today's Date: 05/13/2024   History of Present Illness   Pt is a 43 y.o. male who presented to the ED on 05/11/2024 for chest pain, anterior STEMI and ventricular tachycardia/fibrillation requiring multiple defibrillations. He was intubated in the ED and extubated yesterday. PMH of Ankylosing Spondylitis, hypertension, ADHD, anxiety, hyperlipidemia, incarcerated hernia, nicotine use, marijuana use, social alcohol use     Clinical Impressions Pt was seen for PT/OT co-evaluations this date. PTA, pt lives with his fiance and 91 year old son. He is active and IND working and driving. Pt presents with deficits in strength, balance, slowed processing and low activity tolerance, affecting safe and optimal ADL completion. He is lethargic, but answers all questions and follows all directions during session. Keeps eyes closed most of the time. IND with rolling in bed, supervision to sit at EOB. CGA x2 for STS from EOB without AD use. Able to stand extended amount of time and perform standing grooming tasks at bedside with no LOB and CGA x2 for lines/leads management. Placed on RA at start of session and vitals remained stable around 90 and above. He demo LB dressing to donn bil socks with supervision via figure four. Lateral steps taken and a few steps towards recliner with Min A x2. Frequent rest breaks d/t fatigue and mild initial agitation when first getting up and becoming aware of surroundings and all his lines/leads. He was left at [facility] for wife to assist with quick wash up and PT to return to assist pt back to bed. He did report mild chest discomfort/soreness to his chest during session. Pt would benefit from skilled OT services to address noted impairments and functional limitations to maximize safety and independence while minimizing future risk of falls, injury, and readmission. Do anticipate the need for  follow up OT services upon acute hospital DC.      If plan is discharge home, recommend the following:   A little help with walking and/or transfers;A little help with bathing/dressing/bathroom;Assistance with cooking/housework;Assist for transportation     Functional Status Assessment   Patient has had a recent decline in their functional status and demonstrates the ability to make significant improvements in function in a reasonable and predictable amount of time.     Equipment Recommendations    (TBD pending progress)     Recommendations for Other Services         Precautions/Restrictions   Precautions Precautions: Fall Recall of Precautions/Restrictions: Intact Restrictions Weight Bearing Restrictions Per Provider Order: No     Mobility Bed Mobility Overal bed mobility: Needs Assistance Bed Mobility: Supine to Sit     Supine to sit: Supervision          Transfers Overall transfer level: Needs assistance Equipment used: None Transfers: Sit to/from Stand Sit to Stand: Contact guard assist, Min assist           General transfer comment: CGA x2 for STS from EOB; Min A x2 for lateral steps and steps to recliner with no AD use; frequent rest breaks      Balance Overall balance assessment: Needs assistance Sitting-balance support: Feet unsupported Sitting balance-Leahy Scale: Good     Standing balance support: During functional activity, No upper extremity supported Standing balance-Leahy Scale: Fair                             ADL either performed or  assessed with clinical judgement   ADL Overall ADL's : Needs assistance/impaired     Grooming: Wash/dry face;Oral care;Standing;Sitting;Supervision/safety           Upper Body Dressing : Minimal assistance;Moderate assistance;Sitting Upper Body Dressing Details (indicate cue type and reason): to don gown like a jacket Lower Body Dressing: Supervision/safety;Sitting/lateral  leans Lower Body Dressing Details (indicate cue type and reason): to donn bil socks via figure four at bedside                     Vision         Perception         Praxis         Pertinent Vitals/Pain Pain Assessment Pain Assessment: Faces Faces Pain Scale: Hurts a little bit Pain Location: chest Pain Descriptors / Indicators: Discomfort, Sore Pain Intervention(s): Monitored during session, Repositioned     Extremity/Trunk Assessment Upper Extremity Assessment Upper Extremity Assessment: Generalized weakness   Lower Extremity Assessment Lower Extremity Assessment: Generalized weakness       Communication Communication Communication: No apparent difficulties   Cognition Arousal: Lethargic, Alert   Cognition: No apparent impairments             OT - Cognition Comments: kept eyes closed alot at start of session, has to stretch a lot d/t AS; initially pulling at IJ lines but once redirected                 Following commands: Intact       Cueing  General Comments      VSS during session, placed on RA prior to movement   Exercises Other Exercises Other Exercises: Edu on role of OT in acute setting.   Shoulder Instructions      Home Living Family/patient expects to be discharged to:: Private residence Living Arrangements: Spouse/significant other Available Help at Discharge: Family;Available 24 hours/day Type of Home: House Home Access: Level entry;Stairs to enter (back entry) Entrance Stairs-Number of Steps: 1 step from sun room to the rest of the home   Home Layout: One level     Bathroom Shower/Tub: Chief Strategy Officer: Standard     Home Equipment: None          Prior Functioning/Environment Prior Level of Function : Independent/Modified Independent;Driving             Mobility Comments: active, independent, yard work ADLs Comments: IND    OT Problem List: Decreased activity tolerance;Decreased  strength;Impaired balance (sitting and/or standing)   OT Treatment/Interventions: Therapeutic exercise;Therapeutic activities;Self-care/ADL training;Energy conservation;Patient/family education;DME and/or AE instruction;Balance training      OT Goals(Current goals can be found in the care plan section)   Acute Rehab OT Goals Patient Stated Goal: get better OT Goal Formulation: With patient/family Time For Goal Achievement: 05/27/24 Potential to Achieve Goals: Good ADL Goals Pt Will Perform Lower Body Bathing: with supervision;sitting/lateral leans;sit to/from stand Pt Will Perform Lower Body Dressing: with modified independence;sit to/from stand;sitting/lateral leans Pt Will Transfer to Toilet: with supervision;ambulating;regular height toilet   OT Frequency:  Min 2X/week    Co-evaluation              AM-PAC OT 6 Clicks Daily Activity     Outcome Measure Help from another person eating meals?: None Help from another person taking care of personal grooming?: None Help from another person toileting, which includes using toliet, bedpan, or urinal?: A Little Help from another person bathing (including washing, rinsing, drying)?:  A Little Help from another person to put on and taking off regular upper body clothing?: A Little Help from another person to put on and taking off regular lower body clothing?: A Little 6 Click Score: 20   End of Session Equipment Utilized During Treatment: Gait belt Nurse Communication: Mobility status  Activity Tolerance: Patient tolerated treatment well Patient left: in bed;with call bell/phone within reach;with family/visitor present  OT Visit Diagnosis: Other abnormalities of gait and mobility (R26.89)                Time: 8661-8581 OT Time Calculation (min): 40 min Charges:  OT General Charges $OT Visit: 1 Visit OT Evaluation $OT Eval Moderate Complexity: 1 Mod Marice Guidone, OTR/L  05/13/24, 3:08 PM  Samay Delcarlo E Sarena Jezek 05/13/2024,  2:56 PM

## 2024-05-13 NOTE — Plan of Care (Signed)
  Problem: Education: Goal: Knowledge of General Education information will improve Description: Including pain rating scale, medication(s)/side effects and non-pharmacologic comfort measures Outcome: Progressing   Problem: Clinical Measurements: Goal: Ability to maintain clinical measurements within normal limits will improve Outcome: Progressing Goal: Diagnostic test results will improve Outcome: Progressing Goal: Respiratory complications will improve Outcome: Progressing Goal: Cardiovascular complication will be avoided Outcome: Progressing   Problem: Activity: Goal: Risk for activity intolerance will decrease Outcome: Progressing   Problem: Coping: Goal: Level of anxiety will decrease Outcome: Progressing   Problem: Elimination: Goal: Will not experience complications related to urinary retention Outcome: Progressing   Problem: Pain Managment: Goal: General experience of comfort will improve and/or be controlled Outcome: Progressing   Problem: Safety: Goal: Ability to remain free from injury will improve Outcome: Progressing   Problem: Skin Integrity: Goal: Risk for impaired skin integrity will decrease Outcome: Progressing   Problem: Clinical Measurements: Goal: Will remain free from infection Outcome: Not Progressing   Problem: Nutrition: Goal: Adequate nutrition will be maintained Outcome: Not Progressing
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
## 2024-05-13 NOTE — Progress Notes (Signed)
 Progress Note   Patient: Joshua Bartlett FMW:[id number] DOB: 1981-08-25 DOA: 05/11/2024     2 DOS: the patient was seen and examined on 05/13/2024   Brief hospital course: 43 y.o. male with anterior STEMI status post PCI with DES to LAD requiring intubation and mechanical ventilatory support. Extubated 08/16.  EF 30 to 35% grade 1 diastolic dysfunction on echo.  On amiodarone  gtt, likely to transition to PO on 8/18.  Still having some ventricular ectopy on telemetry.  Cardiology following.  Awaiting LifeVest.  Assessment and Plan:  STEMI with new onset systolic CHF Patient with CVD risk factors including very strong FM Presented with STEMI, underwent emergent cath s/p DES to LAD Required prolonged ventilation, now extubated Echo with EF 30-35%, WMA, grade 1 DD Treated with Cengrelor -> Brilinta  Now on ASA daily Continue amiodarone  Started on Toprol  XL and atorvastatin  Also started on Jardiance  Being fitted for LifeVest Likely to benefit from cardiac rehabilitation at the time of dc  Hyperlipidemia Lipids: 195/44/136/75 LDL goal is <70 Started on high-dose atorvastatin    Acute Hypoxic / Hypercapnic Respiratory Failure secondary to acute encephalopathy in the setting of Anterior STEMI Successfully extubated Hypoxia is resolved, remains on 2L [place] O2; will wean to room air Remove foley catheter Remains mildly encephalopathic Encephalopathy is clearing, may resolve Consider MRI if ongoing confusion, as he is at risk for anoxic brain injury during resuscitation   Chronic Ankylosing Spondylitis Supportive care Avoid NSAID's while receiving blood thinners Lidoderm  patch ordered  Nutrition Status: Nutrition Problem: Inadequate oral intake Etiology: inability to eat Signs/Symptoms: NPO status Interventions: Tube feeding  Anxiety/ADHD Previously on Adderall, which will be discontinued at this time Family is concerned about anxiety moving forward He also smokes and uses THC for  anxiety Smoking cessation is important Would suggest changing to gummies rather than inhalation if unable to stop THC Consider initiation of Wellbutrin, as this may help his anxiety and ADHD as well as his smoking cravings     Consultants: Cardiology PCCM Nutrition  Procedures: CVC insertion 8/15 Intubation 8/15-16 Cardiac catheterization 8/15 Echocardiogram 8/15  Antibiotics: None  30 Day Unplanned Readmission Risk Score    Flowsheet Row ED to Hosp-Admission (Current) from 05/11/2024 in [facility] ICU/CCU  30 Day Unplanned Readmission Risk Score (%) 11.24 Filed at 05/13/2024 0801    This score is the patient's risk of an unplanned readmission within 30 days of being discharged (0 -100%). The score is based on dignosis, age, lab data, medications, orders, and past utilization.   Low:  0-14.9   Medium: 15-21.9   High: 22-29.9   Extreme: 30 and above           Subjective: Family reports improving cognition.  Patient without specific complaints.   Objective: Vitals:   05/13/24 1200 05/13/24 1300  BP:    Pulse: 79 75  Resp: (!) 23 (!) 22  Temp:    SpO2: 98% 95%    Intake/Output Summary (Last 24 hours) at 05/13/2024 1639 Last data filed at 05/13/2024 0700 Gross per 24 hour  Intake 1376.1 ml  Output 1750 ml  Net -373.9 ml   Filed Weights   05/11/24 0600  Weight: 114.4 kg    Exam:  General:  Appears calm but easily agitated; is in NAD Eyes:  normal lids, iris ENT:  grossly normal hearing, lips & tongue, mmm Cardiovascular:  RRR. No LE edema.  Respiratory:   CTA bilaterally with no wheezes/rales/rhonchi.  Normal respiratory effort. Abdomen:  soft, NT,  ND Skin:  no rash or induration seen on limited exam Musculoskeletal:  grossly normal tone BUE/BLE, good ROM, no bony abnormality Psychiatric:  blunted mood and affect, speech limited, not particularly engaged Neurologic:  CN 2-12 grossly intact, moves all extremities in coordinated  fashion  Data Reviewed: I have reviewed the patient's lab results since admission.  Pertinent labs for today include:   Stable BMP HS troponin >24,000 today WBC 10.9 Hgb 11.2     Family Communication: Fiancee and mother were present throughout evaluation  Disposition: Status is: Inpatient Remains inpatient appropriate because: ongoing monitoring     Total critical care time: 55 minutes Critical care time was exclusive of separately billable procedures and treating other patients. Critical care was necessary to treat or prevent imminent or life-threatening deterioration. Critical care was time spent personally by me on the following activities: development of treatment plan with patient and/or surrogate as well as nursing, discussions with consultants, evaluation of patient's response to treatment, examination of patient, obtaining history from patient or surrogate, ordering and performing treatments and interventions, ordering and review of laboratory studies, ordering and review of radiographic studies, pulse oximetry and re-evaluation of patient's condition.   Unresulted Labs (From admission, onward)     Start     Ordered   05/12/24 0500  CBC  Daily,   R      05/11/24 0744   05/12/24 0500  Renal function panel  Daily,   R      05/11/24 0744   05/11/24 1300  ANCA TITERS  Once,   R        05/11/24 1300             Author: Delon Herald, MD 05/13/2024 4:39 PM  For on call review [email or web address].

## 2024-05-13 NOTE — Evaluation (Signed)
 Physical Therapy Evaluation Patient Details Name: Joshua Bartlett MRN: [id number] DOB: 1981/08/18 Today's Date: 05/13/2024  History of Present Illness  Pt is a 43 y.o. male who presented to the ED on 05/11/2024 for chest pain, anterior STEMI and ventricular tachycardia/fibrillation requiring multiple defibrillations. He was intubated in the ED and extubated yesterday. PMH of Ankylosing Spondylitis, hypertension, ADHD, anxiety, hyperlipidemia, incarcerated hernia, nicotine use, marijuana use, social alcohol use  Clinical Impression  PT/OT co-treatment this session for safety. Pt is a pleasant 43 year old male who was admitted for chest pain 2/2 anterior STEMI and ventricular tachycardia/fibrillation. PLOF is independent with all ADLs and ambulation. Pt performs bed mobility with supervision. Pt performs transfers and ambulation with min A for pt safety and line management. Pt able to tolerate activity however, he required multiple breaks and verbal cuing for breathing. PT able to ween pt off of supplemental O2. Vitals stayed WNL throughout session. Pt left on RA. Communicated with nursing. Pt demonstrates deficits with strength and activity tolerance. Would benefit from skilled PT to address above deficits and promote optimal return to PLOF.          If plan is discharge home, recommend the following: A little help with walking and/or transfers;A little help with bathing/dressing/bathroom;Assistance with cooking/housework;Assist for transportation;Help with stairs or ramp for entrance   Can travel by private vehicle        Equipment Recommendations Rolling walker (2 wheels)  Recommendations for Other Services       Functional Status Assessment Patient has had a recent decline in their functional status and demonstrates the ability to make significant improvements in function in a reasonable and predictable amount of time.     Precautions / Restrictions Precautions Precautions: Fall Recall  of Precautions/Restrictions: Intact Restrictions Weight Bearing Restrictions Per Provider Order: No      Mobility  Bed Mobility Overal bed mobility: Needs Assistance Bed Mobility: Supine to Sit     Supine to sit: Supervision     General bed mobility comments: Pt required supervision for sit<>supine for line management and for safety. Able to roll in bed independently.    Transfers Overall transfer level: Needs assistance Equipment used: None Transfers: Sit to/from Stand Sit to Stand: Contact guard assist, Min assist           General transfer comment: CGA x 2 for STS at EOB. Min A x 2 for side steps at EOB and step pivot to chair. min verbal cuing for sequencing.    Ambulation/Gait Ambulation/Gait assistance: Min assist, +2 safety/equipment Gait Distance (Feet): 10 Feet Assistive device: None         General Gait Details: Pt able to ambulate from EOB to recliner which was positioned about 5 ft away. Pt able to take steps without LOB. Min verbal cues for sequencing.  Stairs            Wheelchair Mobility     Tilt Bed    Modified Rankin (Stroke Patients Only)       Balance Overall balance assessment: Needs assistance Sitting-balance support: Feet unsupported Sitting balance-Leahy Scale: Good Sitting balance - Comments: Pt demonstrated good seated balance without feet supported. Pt able to reach while seated to don gowns   Standing balance support: During functional activity, No upper extremity supported Standing balance-Leahy Scale: Fair Standing balance comment: Pt lethargic when standing and demonstrated mild postural sway when closing eyes and standing. Able to tolerate standing for long period of time.  Pertinent Vitals/Pain Pain Assessment Pain Assessment: Faces Faces Pain Scale: Hurts a little bit Pain Location: chest Pain Descriptors / Indicators: Discomfort, Sore Pain Intervention(s): Monitored  during session    Home Living Family/patient expects to be discharged to:: Private residence Living Arrangements: Spouse/significant other Available Help at Discharge: Family;Available 24 hours/day Type of Home: House Home Access: Level entry;Stairs to enter (Back entry is main entrance pt takes. Threshold to enter. No steps. Stairs at front entrance)   [employer] of Steps: 1 step from sun room to the rest of the home   Home Layout: One level Home Equipment: None Additional Comments: Pt does not use DME or AD at baseline.    Prior Function Prior Level of Function : Independent/Modified Independent;Driving             Mobility Comments: active, independent, yard work ADLs Comments: IND     Extremity/Trunk Assessment   Upper Extremity Assessment Upper Extremity Assessment: Generalized weakness    Lower Extremity Assessment Lower Extremity Assessment: Generalized weakness       Communication   Communication Communication: No apparent difficulties    Cognition Arousal: Lethargic, Alert Behavior During Therapy: Restless   PT - Cognitive impairments: No apparent impairments                       PT - Cognition Comments: Pt agreeable to PT session. Upon entering the room, pt moving around a lot in bed. Once seated, pt seemed to want to pull out lines and responded well to verbal and tactile cuing to leave lines alone. However, pt did require multiple reminders. Following commands: Intact       Cueing Cueing Techniques: Verbal cues, Tactile cues     General Comments General comments (skin integrity, edema, etc.): VSS during session, placed on RA prior to movement    Exercises Other Exercises Other Exercises: Side stepping at EOB. Prolonged standing Other Exercises: Linen change Other Exercises: SpO2 assessment. Pt able to tolerate breathing on RA throughout therapy session.   Assessment/Plan    PT Assessment Patient needs continued PT  services  PT Problem List Decreased strength;Decreased activity tolerance;Decreased balance;Decreased mobility;Cardiopulmonary status limiting activity       PT Treatment Interventions DME instruction;Gait training;Stair training;Functional mobility training;Therapeutic activities;Therapeutic exercise;Neuromuscular re-education;Patient/family education    PT Goals (Current goals can be found in the Care Plan section)  Acute Rehab PT Goals Patient Stated Goal: To go home PT Goal Formulation: With patient Time For Goal Achievement: 05/27/24 Potential to Achieve Goals: Good    Frequency Min 2X/week     Co-evaluation PT/OT/SLP Co-Evaluation/Treatment: Yes Reason for Co-Treatment: For patient/therapist safety;Complexity of the patient's impairments (multi-system involvement) PT goals addressed during session: Mobility/safety with mobility OT goals addressed during session: ADL's and self-care       AM-PAC PT 6 Clicks Mobility  Outcome Measure Help needed turning from your back to your side while in a flat bed without using bedrails?: A Little Help needed moving from lying on your back to sitting on the side of a flat bed without using bedrails?: A Little Help needed moving to and from a bed to a chair (including a wheelchair)?: A Little Help needed standing up from a chair using your arms (e.g., wheelchair or bedside chair)?: A Little Help needed to walk in hospital room?: A Little Help needed climbing 3-5 steps with a railing? : A Little 6 Click Score: 18    End of Session   Activity Tolerance:  Patient tolerated treatment well;Patient limited by fatigue Patient left: in bed;with call bell/phone within reach;with family/visitor present Nurse Communication: Mobility status PT Visit Diagnosis: Unsteadiness on feet (R26.81);Muscle weakness (generalized) (M62.81);Difficulty in walking, not elsewhere classified (R26.2)    Time: 8661-8581 PT Time Calculation (min) (ACUTE ONLY): 40  min   Charges:                 Mina Babula, SPT   Abbi Mancini 05/13/2024, 3:32 PM

## 2024-05-13 NOTE — Hospital Course (Signed)
 43 y.o. male with anterior STEMI status post PCI with DES to LAD requiring intubation and mechanical ventilatory support. Extubated 08/16.  EF 30 to 35% grade 1 diastolic dysfunction on echo.  On amiodarone  gtt, likely to transition to PO on 8/18.  Still having some ventricular ectopy on telemetry.  Cardiology following.  Awaiting LifeVest.

## 2024-05-13 NOTE — Progress Notes (Signed)
 [facility] CARDIOLOGY PROGRESS NOTE   Patient ID: Joshua Bartlett MRN: [id number] DOB/AGE: [birth year] 42 y.o.  Admit date: 05/11/2024 Referring Physician None - STEMI patient Primary Physician Myrla Jon HERO, MD  Primary Cardiologist None Reason for Consultation Anterior STEMI  HPI: Joshua Bartlett is a 43 y.o. male with a past medical history of incarcerated hernia, nicotine use, marijuana use, social alcohol use who presented to the ED on 05/11/2024 for chest pain, anterior STEMI and ventricular tachycardia/fibrillation requiring multiple defibrillations.  He was intubated in the ED and taken emergently to the Cath Lab. Angiography revealed occluded proximal/mid LAD, s/p successful DES placement proximal/mid LAD.  Started on IV amiodarone , cangrelor  and admitted to the ICU.  Was initially on Aggrastat  however this was stopped after he developed hematoma at site of IJ central line.  Interval History:  - Patient seen and examined this morning, resting comfortably in hospital bed.  He was extubated yesterday morning. - Still with some ventricular ectopy, he was rebolused with amiodarone  yesterday afternoon. - BP controlled, will start additions of GDMT today. - Normal renal function with good urine output noted.  Review of systems complete and found to be negative unless listed above   Vitals:   05/13/24 0400 05/13/24 0500 05/13/24 0600 05/13/24 0700  BP: 111/69 (!) 125/92 122/88 119/88  Pulse: 79 75 80 80  Resp: (!) 24 (!) 22 (!) 25 (!) 23  Temp: 98 F (36.7 C)     TempSrc: Axillary     SpO2: 97% 98% 95% 94%  Weight:      Height:         Intake/Output Summary (Last 24 hours) at 05/13/2024 9073 Last data filed at 05/13/2024 0700 Gross per 24 hour  Intake 2304.16 ml  Output 3225 ml  Net -920.84 ml     PHYSICAL EXAM General: Ill appearing young male, well nourished, in no acute distress. HEENT: Normocephalic and atraumatic. Neck: No JVD.  Lungs: Normal respiratory  effort.  CTAB. Heart: HRRR. Normal S1 and S2 without gallops or murmurs. Radial & DP pulses 2+ bilaterally. Abdomen: Non-distended appearing.  Msk: Normal strength and tone for age. Extremities: No clubbing, cyanosis or edema.     LABS: Basic Metabolic Panel: Recent Labs    05/12/24 0500 05/12/24 1452 05/12/24 2038 05/13/24 0420  NA 139  --   --  138  K 4.1   < > 3.8 3.6  CL 108  --   --  107  CO2 26  --   --  24  GLUCOSE 136*  --   --  98  BUN 16  --   --  10  CREATININE 0.75  --   --  0.83  CALCIUM  8.3*  --   --  8.2*  MG 1.9   < > 2.2 1.9  PHOS 2.8   < > 4.2 3.8   < > = values in this interval not displayed.   Liver Function Tests: Recent Labs    05/11/24 2251 05/12/24 0500 05/13/24 0420  AST 478* 430*  --   ALT 81* 83*  --   ALKPHOS 41 41  --   BILITOT 0.7 0.2  --   PROT 5.8* 5.8*  --   ALBUMIN 3.1* 3.1*  3.2* 3.0*   No results for input(s): LIPASE, AMYLASE in the last 72 hours. CBC: Recent Labs    05/11/24 0430 05/11/24 0808 05/12/24 0500 05/13/24 0420  WBC 22.0*   < > 11.7* 10.9*  NEUTROABS  18.0*  --   --   --   HGB 12.8*   < > 11.5* 11.2*  HCT 39.1   < > 36.2* 34.0*  MCV 93.3   < > 96.8 94.7  PLT 345   < > 225 212   < > = values in this interval not displayed.   Cardiac Enzymes: Recent Labs    05/11/24 0430 05/12/24 0500 05/13/24 0420  CKMB  --  251.3*  --   TROPONINIHS >24,000* >24,000* >24,000*   BNP: Recent Labs    05/11/24 0430  BNP 3.4   D-Dimer: No results for input(s): DDIMER in the last 72 hours. Hemoglobin A1C: Recent Labs    05/11/24 0530  HGBA1C 5.1   Fasting Lipid Panel: Recent Labs    05/11/24 0530 05/12/24 0500  CHOL 195  --   HDL 44  --   LDLCALC 136*  --   TRIG 75 138  CHOLHDL 4.4  --    Thyroid  Function Tests: Recent Labs    05/11/24 0530  TSH 3.630  T4TOTAL 7.4   Anemia Panel: No results for input(s): VITAMINB12, FOLATE, FERRITIN, TIBC, IRON, RETICCTPCT in the last 72  hours.  ECHOCARDIOGRAM COMPLETE Result Date: 05/12/2024    ECHOCARDIOGRAM REPORT   Patient Name:   Joshua Bartlett Date of Exam: 05/11/2024 Medical Rec #:  [id number]      Height:       74.0 in Accession #:    [id number]     Weight:       252.2 lb Date of Birth:  Jul 15, 1981     BSA:          2.399 m Patient Age:    42 years       BP:           126/99 mmHg Patient Gender: M              HR:           76 bpm. Exam Location:  [facility] Procedure: 2D Echo, Cardiac Doppler, Color Doppler and Intracardiac            Opacification Agent (Both Spectral and Color Flow Doppler were            utilized during procedure). Indications:     Acute myocardial infarction, unspecified I21.9  History:         Patient has no prior history of Echocardiogram examinations.                  Acute MI; Risk Factors:Hypertension and Current Smoker.  Sonographer:     Thea Norlander RCS Referring Phys:  [id number] Marquita Lias Diagnosing Phys: Dwayne D Callwood MD IMPRESSIONS  1. Large area ant/apical/septal/lateral akinesis.  2. Left ventricular ejection fraction, by estimation, is 30 to 35%. The left ventricle has moderately decreased function. The left ventricle demonstrates regional wall motion abnormalities (see scoring diagram/findings for description). The left ventricular internal cavity size was moderately dilated. Left ventricular diastolic parameters are consistent with Grade I diastolic dysfunction (impaired relaxation).  3. Right ventricular systolic function is normal. The right ventricular size is normal.  4. The mitral valve is normal in structure. Mild mitral valve regurgitation. No evidence of mitral stenosis.  5. The aortic valve is normal in structure. Aortic valve regurgitation is not visualized. No aortic stenosis is present.  6. The inferior vena cava is normal in size with greater than 50% respiratory variability, suggesting right atrial pressure of 3 mmHg. FINDINGS  Left Ventricle: Left ventricular ejection fraction, by  estimation, is 30 to 35%. The left ventricle has moderately decreased function. The left ventricle demonstrates regional wall motion abnormalities. Definity  contrast agent was given IV to delineate the left ventricular endocardial borders. Strain was performed and the global longitudinal strain is indeterminate. Global longitudinal strain performed but not reported based on interpreter judgement due to suboptimal tracking. The left ventricular internal cavity size was moderately dilated. There is no left ventricular hypertrophy. Left ventricular diastolic parameters are consistent with Grade I diastolic dysfunction (impaired relaxation).  LV Wall Scoring: The entire anterior septum and apex are akinetic. The apical lateral segment, apical anterior segment, basal anterior segment, apical inferior segment, and basal inferoseptal segment are hypokinetic. Right Ventricle: The right ventricular size is normal. No increase in right ventricular wall thickness. Right ventricular systolic function is normal. Left Atrium: Left atrial size was normal in size. Right Atrium: Right atrial size was normal in size. Pericardium: There is no evidence of pericardial effusion. Mitral Valve: The mitral valve is normal in structure. Mild mitral valve regurgitation. No evidence of mitral valve stenosis. Tricuspid Valve: The tricuspid valve is normal in structure. Tricuspid valve regurgitation is mild . No evidence of tricuspid stenosis. Aortic Valve: The aortic valve is normal in structure. Aortic valve regurgitation is not visualized. No aortic stenosis is present. Aortic valve peak gradient measures 2.6 mmHg. Pulmonic Valve: The pulmonic valve was normal in structure. Pulmonic valve regurgitation is not visualized. No evidence of pulmonic stenosis. Aorta: The aortic root is normal in size and structure. Venous: The inferior vena cava is normal in size with greater than 50% respiratory variability, suggesting right atrial pressure of 3  mmHg. IAS/Shunts: No atrial level shunt detected by color flow Doppler. Additional Comments: Large area ant/apical/septal/lateral akinesis. 3D was performed not requiring image post processing on an independent workstation and was indeterminate.  LEFT VENTRICLE PLAX 2D LVIDd:         5.80 cm   Diastology LVIDs:         4.80 cm   LV e' medial:    6.24 cm/s LV PW:         1.10 cm   LV E/e' medial:  7.1 LV IVS:        1.10 cm   LV e' lateral:   11.50 cm/s LVOT diam:     2.40 cm   LV E/e' lateral: 3.9 LV SV:         38 LV SV Index:   16 LVOT Area:     4.52 cm  RIGHT VENTRICLE            IVC RV S prime:     9.20 cm/s  IVC diam: 3.00 cm TAPSE (M-mode): 1.8 cm LEFT ATRIUM           Index        RIGHT ATRIUM           Index LA diam:      3.00 cm 1.25 cm/m   RA Area:     12.80 cm LA Vol (A2C): 27.0 ml 11.26 ml/m  RA Volume:   36.40 ml  15.18 ml/m LA Vol (A4C): 34.1 ml 14.22 ml/m  AORTIC VALVE AV Area (Vmax): 3.08 cm AV Vmax:        81.00 cm/s AV Peak Grad:   2.6 mmHg LVOT Vmax:      55.10 cm/s LVOT Vmean:     34.000 cm/s LVOT VTI:  0.084 m  AORTA Ao Root diam: 3.60 cm Ao Asc diam:  3.40 cm MITRAL VALVE               TRICUSPID VALVE MV Area (PHT): 3.42 cm    TR Peak grad:   21.7 mmHg MV Decel Time: 222 msec    TR Vmax:        233.00 cm/s MV E velocity: 44.30 cm/s MV A velocity: 54.50 cm/s  SHUNTS MV E/A ratio:  0.81        Systemic VTI:  0.08 m                            Systemic Diam: 2.40 cm Cara JONETTA Lovelace MD Electronically signed by Cara JONETTA Lovelace MD Signature Date/Time: 05/12/2024/12:52:30 PM    Final    US  Abdomen Limited RUQ (LIVER/GB) Result Date: 05/12/2024 CLINICAL DATA:  Elevated liver function tests EXAM: ULTRASOUND ABDOMEN LIMITED RIGHT UPPER QUADRANT COMPARISON:  None Available. FINDINGS: Gallbladder: No gallstones or wall thickening visualized. No sonographic Murphy sign noted by sonographer. Common bile duct: Diameter: 5 mm Liver: No focal lesion identified. Within normal limits in  parenchymal echogenicity. Portal vein is patent on color Doppler imaging with normal direction of blood flow towards the liver. Other: None. IMPRESSION: 1. Unremarkable right upper quadrant ultrasound. Electronically Signed   By: Ozell Daring M.D.   On: 05/12/2024 08:49   DG Chest Port 1 View Result Date: 05/12/2024 CLINICAL DATA:  Acute respiratory failure, hypoxia EXAM: PORTABLE CHEST 1 VIEW COMPARISON:  05/11/2024 FINDINGS: Single frontal view of the chest demonstrates endotracheal tube overlying tracheal air column, tip 6.3 cm above carina. Right internal jugular catheter tip overlies superior vena cava. Enteric catheter passes below diaphragm, tip excluded by collimation. Esophageal temperature probe overlies the midthoracic esophagus. Cardiac silhouette is stable. No airspace disease, effusion, or pneumothorax. No acute bony abnormalities. IMPRESSION: 1. Endotracheal tube identified, tip now 6.3 cm above carina. Recommend advancing 3-4 cm. 2. Remaining sore devices as above. 3. No acute intrathoracic process. Electronically Signed   By: Ozell Daring M.D.   On: 05/12/2024 08:48     ECHO as above  TELEMETRY reviewed by me 05/13/24: NSR rate 70s, occasional PVCs  EKG reviewed by me 05/13/24: Sinus rhythm with PVCs, baseline artifact making interpretation difficult  DATA reviewed by me 05/13/24: last 24h vitals tele labs imaging I/O, PCCM notes  Principal Problem:   Acute ST elevation myocardial infarction (STEMI) involving left anterior descending (LAD) coronary artery (HCC)    ASSESSMENT AND PLAN: Joshua Bartlett is a 43 y.o. male with a past medical history of incarcerated hernia, nicotine use, marijuana use, social alcohol use who presented to the ED on 05/11/2024 for chest pain, anterior STEMI and ventricular tachycardia/fibrillation requiring multiple defibrillations.  He was intubated in the ED and taken emergently to the Cath Lab. Angiography revealed occluded proximal/mid LAD, s/p  successful DES placement proximal/mid LAD.  Started on IV amiodarone , cangrelor  and admitted to the ICU.  Was initially on Aggrastat  however this was stopped after he developed hematoma at site of IJ central line.  # Anterior STEMI # Cardiac arrest # Ischemic cardiomypathy # HFrEF Patient with anterior STEMI, cardiac arrest s/p multiple shocks. Underwent LHC with 2.75 x 26 mm Onyx Frontier DES proximal/mid LAD. Remains intubated and sedated. Echo with EF 30-35%, large area of anterior/apical/septal/lateral akinesis, grade 1 diastolic dysfunction, moderate LV dilation, normal RV size and function, mild  MR.  He has persistent ST elevation noted on telemetry and repeat EKGs, today appears to be stable compared to EKG yesterday.  This morning he states that overall his pain is improving. -Continue aspirin  81 mg daily, atorvastatin  80 mg daily. -Continue IV amiodarone .  Likely plan to transition to p.o. tomorrow. - Transitioned to p.o. Brilinta  yesterday with loading dose, continue 90 mg twice daily. - Start losartan  12.5 mg daily, Jardiance  10 mg daily.  Continue metoprolol  succinate 25 mg twice daily.  Recommend starting spironolactone tomorrow if BP and renal function tolerate. -LifeVest has been approved, I have communicated with [facility] team that patient is now extubated and can be fitted.  This patient's case was discussed and created with Dr. Florencio and he is in agreement.  Signed:  Danita Bloch, PA-C  05/13/2024, 9:26 AM [facility]

## 2024-05-14 ENCOUNTER — Other Ambulatory Visit (HOSPITAL_COMMUNITY): Payer: Self-pay

## 2024-05-14 ENCOUNTER — Telehealth (HOSPITAL_COMMUNITY): Payer: Self-pay | Admitting: Pharmacy Technician

## 2024-05-14 DIAGNOSIS — I5021 Acute systolic (congestive) heart failure: Secondary | ICD-10-CM | POA: Diagnosis present

## 2024-05-14 DIAGNOSIS — I471 Supraventricular tachycardia, unspecified: Secondary | ICD-10-CM | POA: Diagnosis not present

## 2024-05-14 DIAGNOSIS — F419 Anxiety disorder, unspecified: Secondary | ICD-10-CM | POA: Diagnosis not present

## 2024-05-14 DIAGNOSIS — I2102 ST elevation (STEMI) myocardial infarction involving left anterior descending coronary artery: Secondary | ICD-10-CM | POA: Diagnosis not present

## 2024-05-14 DIAGNOSIS — I4901 Ventricular fibrillation: Secondary | ICD-10-CM | POA: Diagnosis not present

## 2024-05-14 DIAGNOSIS — I255 Ischemic cardiomyopathy: Secondary | ICD-10-CM | POA: Diagnosis not present

## 2024-05-14 DIAGNOSIS — Z955 Presence of coronary angioplasty implant and graft: Secondary | ICD-10-CM | POA: Diagnosis not present

## 2024-05-14 DIAGNOSIS — K7200 Acute and subacute hepatic failure without coma: Secondary | ICD-10-CM | POA: Diagnosis not present

## 2024-05-14 DIAGNOSIS — I2109 ST elevation (STEMI) myocardial infarction involving other coronary artery of anterior wall: Secondary | ICD-10-CM | POA: Diagnosis not present

## 2024-05-14 DIAGNOSIS — Z8674 Personal history of sudden cardiac arrest: Secondary | ICD-10-CM | POA: Diagnosis not present

## 2024-05-14 DIAGNOSIS — Z7982 Long term (current) use of aspirin: Secondary | ICD-10-CM | POA: Diagnosis not present

## 2024-05-14 DIAGNOSIS — E785 Hyperlipidemia, unspecified: Secondary | ICD-10-CM | POA: Diagnosis present

## 2024-05-14 DIAGNOSIS — Z7902 Long term (current) use of antithrombotics/antiplatelets: Secondary | ICD-10-CM | POA: Diagnosis not present

## 2024-05-14 DIAGNOSIS — M457 Ankylosing spondylitis of lumbosacral region: Secondary | ICD-10-CM | POA: Diagnosis not present

## 2024-05-14 DIAGNOSIS — Z87891 Personal history of nicotine dependence: Secondary | ICD-10-CM | POA: Diagnosis not present

## 2024-05-14 DIAGNOSIS — I10 Essential (primary) hypertension: Secondary | ICD-10-CM | POA: Diagnosis not present

## 2024-05-14 DIAGNOSIS — T82897A Other specified complication of cardiac prosthetic devices, implants and grafts, initial encounter: Secondary | ICD-10-CM | POA: Diagnosis not present

## 2024-05-14 DIAGNOSIS — I519 Heart disease, unspecified: Secondary | ICD-10-CM | POA: Diagnosis not present

## 2024-05-14 DIAGNOSIS — E8809 Other disorders of plasma-protein metabolism, not elsewhere classified: Secondary | ICD-10-CM | POA: Diagnosis not present

## 2024-05-14 DIAGNOSIS — I472 Ventricular tachycardia, unspecified: Secondary | ICD-10-CM | POA: Diagnosis not present

## 2024-05-14 DIAGNOSIS — F909 Attention-deficit hyperactivity disorder, unspecified type: Secondary | ICD-10-CM | POA: Diagnosis not present

## 2024-05-14 DIAGNOSIS — I513 Intracardiac thrombosis, not elsewhere classified: Secondary | ICD-10-CM | POA: Diagnosis not present

## 2024-05-14 LAB — BASIC METABOLIC PANEL WITH GFR
Anion gap: 9 (ref 5–15)
BUN: 10 mg/dL (ref 6–20)
CO2: 25 mmol/L (ref 22–32)
Calcium: 8.4 mg/dL — ABNORMAL LOW (ref 8.9–10.3)
Chloride: 106 mmol/L (ref 98–111)
Creatinine, Ser: 0.85 mg/dL (ref 0.61–1.24)
GFR, Estimated: >60 mL/min (ref >60)
Glucose, Bld: 96 mg/dL (ref 70–99)
Potassium: 3.6 mmol/L (ref 3.5–5.1)
Sodium: 140 mmol/L (ref 135–145)

## 2024-05-14 LAB — CBC
HCT: 35.5 % — ABNORMAL LOW (ref 39.0–52.0)
Hemoglobin: 11.5 g/dL — ABNORMAL LOW (ref 13.0–17.0)
MCH: 30.3 pg (ref 26.0–34.0)
MCHC: 32.4 g/dL (ref 30.0–36.0)
MCV: 93.4 fL (ref 80.0–100.0)
Platelets: 223 K/uL (ref 150–400)
RBC: 3.8 MIL/uL — ABNORMAL LOW (ref 4.22–5.81)
RDW: 11.7 % (ref 11.5–15.5)
WBC: 9.9 K/uL (ref 4.0–10.5)
nRBC: 0 % (ref 0.0–0.2)

## 2024-05-14 LAB — MAGNESIUM: Magnesium: 1.9 mg/dL (ref 1.7–2.4)

## 2024-05-14 LAB — CG4 I-STAT (LACTIC ACID)
Lactic Acid, Venous: 2.6 mmol/L (ref 0.5–1.9)
Lactic Acid, Venous: 5.6 mmol/L (ref 0.5–1.9)

## 2024-05-14 LAB — HEPARIN LEVEL (UNFRACTIONATED): Heparin Unfractionated: 0.24 [IU]/mL — ABNORMAL LOW (ref 0.30–0.70)

## 2024-05-14 LAB — ANCA TITERS
Atypical P-ANCA titer: <1:20 {titer}
C-ANCA: <1:20 {titer}
P-ANCA: <1:20 {titer}

## 2024-05-14 LAB — GLUCOSE, CAPILLARY
Glucose-Capillary: 94 mg/dL (ref 70–99)
Glucose-Capillary: 90 mg/dL (ref 70–99)
Glucose-Capillary: 89 mg/dL (ref 70–99)
Glucose-Capillary: 104 mg/dL — ABNORMAL HIGH (ref 70–99)
Glucose-Capillary: 126 mg/dL — ABNORMAL HIGH (ref 70–99)

## 2024-05-14 LAB — TROPONIN I (HIGH SENSITIVITY): Troponin I (High Sensitivity): >24000 ng/L (ref <18)

## 2024-05-14 LAB — POCT ACTIVATED CLOTTING TIME
Activated Clotting Time: 222 s
Activated Clotting Time: 417 s

## 2024-05-14 MED ORDER — METOPROLOL TARTRATE 5 MG/5ML IV SOLN
5.0000 mg | Freq: Once | INTRAVENOUS | Status: AC
  Administered 2024-05-14: 5 mg via INTRAVENOUS
  Filled 2024-05-14: qty 5

## 2024-05-14 MED ORDER — FAMOTIDINE 20 MG PO TABS: 20.0000 mg | ORAL_TABLET | Freq: Two times a day (BID) | ORAL | Status: AC

## 2024-05-14 MED ORDER — EMPAGLIFLOZIN 10 MG PO TABS: 10.0000 mg | ORAL_TABLET | Freq: Every day | ORAL | Status: AC

## 2024-05-14 MED ORDER — HEPARIN BOLUS VIA INFUSION
1600.0000 [IU] | Freq: Once | INTRAVENOUS | Status: AC
  Administered 2024-05-14: 1600 [IU] via INTRAVENOUS
  Filled 2024-05-14: qty 1600

## 2024-05-14 MED ORDER — METOPROLOL SUCCINATE ER 50 MG PO TB24
50.0000 mg | ORAL_TABLET | Freq: Two times a day (BID) | ORAL | Status: AC
Start: 2024-05-14 — End: ?

## 2024-05-14 MED ORDER — HEPARIN (PORCINE) 25000 UT/250ML-% IV SOLN
1700.0000 [IU]/h | INTRAVENOUS | Status: DC
  Administered 2024-05-14: 1700 [IU]/h via INTRAVENOUS
  Administered 2024-05-14: 1500 [IU]/h via INTRAVENOUS
  Filled 2024-05-14 (×2): qty 250

## 2024-05-14 MED ORDER — LIDOCAINE 5 % EX PTCH: 1.0000 | MEDICATED_PATCH | CUTANEOUS | Status: AC

## 2024-05-14 MED ORDER — LOSARTAN POTASSIUM 25 MG PO TABS: 12.5000 mg | ORAL_TABLET | Freq: Every day | ORAL | Status: AC

## 2024-05-14 MED ORDER — ADULT MULTIVITAMIN W/MINERALS CH: 1.0000 | ORAL_TABLET | Freq: Every day | ORAL | Status: DC

## 2024-05-14 MED ORDER — AMIODARONE HCL IN DEXTROSE 360-4.14 MG/200ML-% IV SOLN: 30.0000 mg/h | INTRAVENOUS | Status: AC

## 2024-05-14 MED ORDER — FENTANYL CITRATE PF 50 MCG/ML IJ SOSY: 50.0000 ug | PREFILLED_SYRINGE | INTRAMUSCULAR | Status: AC | PRN

## 2024-05-14 MED ORDER — HEPARIN (PORCINE) 25000 UT/250ML-% IV SOLN: 1500.0000 [IU]/h | INTRAVENOUS | Status: AC

## 2024-05-14 MED ORDER — ONDANSETRON HCL 4 MG/2ML IJ SOLN: 4.0000 mg | Freq: Four times a day (QID) | INTRAMUSCULAR | Status: AC | PRN

## 2024-05-14 MED ORDER — ASPIRIN 81 MG PO CHEW
81.0000 mg | CHEWABLE_TABLET | Freq: Every day | ORAL | Status: AC
Start: 2024-05-15 — End: ?

## 2024-05-14 MED ORDER — POTASSIUM CHLORIDE CRYS ER 20 MEQ PO TBCR
40.0000 meq | EXTENDED_RELEASE_TABLET | Freq: Once | ORAL | Status: AC
  Administered 2024-05-14: 40 meq via ORAL
  Filled 2024-05-14: qty 2

## 2024-05-14 MED ORDER — ENSURE PLUS HIGH PROTEIN PO LIQD: 237.0000 mL | Freq: Three times a day (TID) | ORAL | Status: DC

## 2024-05-14 MED ORDER — ENSURE PLUS HIGH PROTEIN PO LIQD
237.0000 mL | Freq: Three times a day (TID) | ORAL | Status: DC
  Administered 2024-05-14: 237 mL via ORAL

## 2024-05-14 MED ORDER — MAGNESIUM SULFATE 2 GM/50ML IV SOLN
2.0000 g | Freq: Once | INTRAVENOUS | Status: AC
  Administered 2024-05-14: 2 g via INTRAVENOUS
  Filled 2024-05-14: qty 50

## 2024-05-14 MED ORDER — METOPROLOL SUCCINATE ER 50 MG PO TB24
50.0000 mg | ORAL_TABLET | Freq: Two times a day (BID) | ORAL | Status: DC
  Administered 2024-05-14: 50 mg via ORAL
  Filled 2024-05-14: qty 1

## 2024-05-14 MED ORDER — HEPARIN BOLUS VIA INFUSION
4000.0000 [IU] | Freq: Once | INTRAVENOUS | Status: AC
  Administered 2024-05-14: 4000 [IU] via INTRAVENOUS
  Filled 2024-05-14: qty 4000

## 2024-05-14 MED ORDER — ORAL CARE MOUTH RINSE: 15.0000 mL | OROMUCOSAL | Status: AC | PRN

## 2024-05-14 MED ORDER — TICAGRELOR 90 MG PO TABS: 90.0000 mg | ORAL_TABLET | Freq: Two times a day (BID) | ORAL | Status: AC

## 2024-05-14 MED ORDER — CHLORHEXIDINE GLUCONATE CLOTH 2 % EX PADS: 6.0000 | MEDICATED_PAD | Freq: Every day | CUTANEOUS | Status: AC

## 2024-05-14 MED ORDER — ATORVASTATIN CALCIUM 80 MG PO TABS: 80.0000 mg | ORAL_TABLET | Freq: Every day | ORAL | Status: AC

## 2024-05-14 NOTE — Progress Notes (Addendum)
 Patient is being transported by [facility] to [facility] Room [phone]. Patients personal belongings were sent with family. Patient is wearing lifevest and lifevest box will be taken with patient in the ambulance. Report called to Kaitlin Garrett, RN at [facility].
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
## 2024-05-14 NOTE — TOC Initial Note (Signed)
 Transition of Care [facility]) - Initial/Assessment Note    Patient Details  Name: Joshua Bartlett MRN: [id number] Date of Birth: 01-17-1981  Transition of Care [facility]) CM/SW Contact:    Corean ONEIDA Haddock, RN Phone Number: 05/14/2024, 12:51 PM  Clinical Narrative:                 Pending transfer to [facility] noted        Patient Goals and CMS Choice            Expected Discharge Plan and Services         Expected Discharge Date: 05/14/24                                    Prior Living Arrangements/Services                       Activities of Daily Living      Permission Sought/Granted                  Emotional Assessment              Admission diagnosis:  Acute ST elevation myocardial infarction (STEMI) involving left anterior descending (LAD) coronary artery (HCC) [I21.02] Patient Active Problem List   Diagnosis Date Noted   Acute systolic (congestive) heart failure (HCC) 05/14/2024   Ventricular tachycardia (HCC) 05/14/2024   Hyperlipidemia 05/14/2024   Acute ST elevation myocardial infarction (STEMI) involving left anterior descending (LAD) coronary artery (HCC) 05/11/2024   Incarcerated epigastric hernia 01/03/2024   Primary hypertension 04/24/2021   Obesity 04/24/2021   Ventral hernia without obstruction or gangrene 09/13/2018   History of tobacco use disorder 06/12/2018   HLA B27 positive 04/13/2018   Anxiety 11/25/2017   History of uveitis 11/25/2017   Ankylosing spondylitis (HCC) 11/25/2017   Adult ADHD 12/04/2014   PCP:  Myrla Jon HERO, MD Pharmacy:   [id number] [place], [place] - [address] AT [facility] OF [address] [place] [address] Phone: [phone] Fax: [phone]  [id number] - [place], [place] - [address] AT [facility] OF [address] [facility] [address] [place] [address] Phone: [phone] Fax:  [phone]     Social Drivers of Health (SDOH) Social History: SDOH Screenings   Food Insecurity: Patient Unable To Answer (05/11/2024)  Housing: Patient Unable To Answer (05/11/2024)  Transportation Needs: Patient Unable To Answer (05/11/2024)  Utilities: Patient Unable To Answer (05/11/2024)  Alcohol Screen: Low Risk  (07/04/2023)  Depression (PHQ2-9): Low Risk  (08/02/2023)  Recent Concern: Depression (PHQ2-9) - Medium Risk (07/05/2023)  Financial Resource Strain: Medium Risk (07/04/2023)  Physical Activity: Insufficiently Active (07/04/2023)  Social Connections: Socially Isolated (07/04/2023)  Stress: Stress Concern Present (07/04/2023)  Tobacco Use: Medium Risk (01/03/2024)   SDOH Interventions:     Readmission Risk Interventions     No data to display
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
## 2024-05-14 NOTE — Progress Notes (Signed)
 PT Cancellation Note  Patient Details Name: Joshua Bartlett MRN: [id number] DOB: October 08, 1980   Cancelled Treatment:    Reason Eval/Treat Not Completed: Other (comment). Per chart, had more cardiac symptoms over night with run of Vtach. Discussed with RN, pt with plans to transfer to [facility] for higher level of care. Will sign off at this time. Please re-consult if needs change.   Rommel Hogston 05/14/2024, 12:30 PM Corean Dade, PT, DPT, GCS [phone]

## 2024-05-14 NOTE — Progress Notes (Signed)
 Heart Failure Navigator Progress Note  Assessed for Heart & Vascular TOC clinic readiness.  Patient does not meet criteria due to current [facility] patient.   Navigator will sign off at this time.  Charmaine Pines, RN, BSN [facility] Heart Failure Navigator Secure Chat Only

## 2024-05-14 NOTE — Telephone Encounter (Signed)
 Spoke to patient's mother (she is on the [employer]) this morning. She reports some improvement as he is off of the ventilator, but also some worsening with chest pain and need for another catheterization.  She is working with Dr Florencio on possible transfer to [facility] and I support the care teams decision regarding this.  He may need FMLA for work and we will complete if needed and not able to be completed in hospital.

## 2024-05-14 NOTE — Progress Notes (Signed)
 [facility] CLINIC CARDIOLOGY PROGRESS NOTE   Patient ID: Joshua Bartlett MRN: [id number] DOB/AGE: 41-13-1982 40 y.o.  Admit date: 05/11/2024 Referring Physician None - STEMI patient Primary Physician Myrla Jon HERO, MD  Primary Cardiologist None Reason for Consultation Anterior STEMI  HPI: Joshua Bartlett is a 43 y.o. male with a past medical history of incarcerated hernia, nicotine use, marijuana use, social alcohol use who presented to the ED on 05/11/2024 for chest pain, anterior STEMI and ventricular tachycardia/fibrillation requiring multiple defibrillations.  He was intubated in the ED and taken emergently to the Cath Lab. Angiography revealed occluded proximal/mid LAD, s/p successful DES placement proximal/midgcd18 LAD.  Started on IV amiodarone , cangrelor  and admitted to the ICU.  Was initially on Aggrastat  however this was stopped after he developed hematoma at site of IJ central line.  Interval History:  - Patient seen and examined this morning, resting comfortably in hospital bed.  - Per tele has episode of 47 beat VT, patient received PO metoprolol  50 mg and IV 5 mg lopressor , started on IV heparin  gtt - BP controlled. - Normal renal function with good urine output noted. - Patient's family requested that patient get transferred to [facility] for further management.  - Pending transfer to [facility], Dr. Elna Pinks as accepting physician, bed assigned [phone]  Review of systems complete and found to be negative unless listed above   Vitals:   05/14/24 0715 05/14/24 0800 05/14/24 0900 05/14/24 0917  BP: (!) 121/94 (!) 126/94 112/74 109/80  Pulse: 78 74 81 80  Resp: (!) 25 (!) 26 19 (!) 24  Temp:   98 F (36.7 C)   TempSrc:   Axillary   SpO2: 97% 98% 95% 95%  Weight:      Height:         Intake/Output Summary (Last 24 hours) at 05/14/2024 1030 Last data filed at 05/14/2024 0700 Gross per 24 hour  Intake 502.24 ml  Output 2700 ml  Net -2197.76 ml     PHYSICAL  EXAM General: Ill appearing young male, well nourished, in no acute distress. HEENT: Normocephalic and atraumatic. Neck: No JVD.  Lungs: Normal respiratory effort.  CTAB. Heart: HRRR. Normal S1 and S2 without gallops or murmurs. Radial & DP pulses 2+ bilaterally. Abdomen: Non-distended appearing.  Msk: Normal strength and tone for age. Extremities: No clubbing, cyanosis or edema.     LABS: Basic Metabolic Panel: Recent Labs    05/12/24 2038 05/13/24 0420 05/14/24 0347  NA  --  138 140  K 3.8 3.6 3.6  CL  --  107 106  CO2  --  24 25  GLUCOSE  --  98 96  BUN  --  10 10  CREATININE  --  0.83 0.85  CALCIUM   --  8.2* 8.4*  MG 2.2 1.9 1.9  PHOS 4.2 3.8  --    Liver Function Tests: Recent Labs    05/11/24 2251 05/12/24 0500 05/13/24 0420  AST 478* 430*  --   ALT 81* 83*  --   ALKPHOS 41 41  --   BILITOT 0.7 0.2  --   PROT 5.8* 5.8*  --   ALBUMIN 3.1* 3.1*  3.2* 3.0*   No results for input(s): LIPASE, AMYLASE in the last 72 hours. CBC: Recent Labs    05/13/24 0420 05/14/24 0347  WBC 10.9* 9.9  HGB 11.2* 11.5*  HCT 34.0* 35.5*  MCV 94.7 93.4  PLT 212 223   Cardiac Enzymes: Recent Labs    05/12/24  0500 05/13/24 0420 05/14/24 0347  CKMB 251.3*  --   --   TROPONINIHS >24,000* >24,000* >24,000*   BNP: No results for input(s): BNP in the last 72 hours.  D-Dimer: No results for input(s): DDIMER in the last 72 hours. Hemoglobin A1C: No results for input(s): HGBA1C in the last 72 hours.  Fasting Lipid Panel: Recent Labs    05/12/24 0500  TRIG 138   Thyroid  Function Tests: No results for input(s): TSH, T4TOTAL, T3FREE, THYROIDAB in the last 72 hours.  Invalid input(s): FREET3  Anemia Panel: No results for input(s): VITAMINB12, FOLATE, FERRITIN, TIBC, IRON, RETICCTPCT in the last 72 hours.  No results found.    ECHO as above  TELEMETRY reviewed by me 05/14/24: NSR rate 80s  EKG reviewed by me 05/14/24: Sinus  rhythm with PVCs, baseline artifact making interpretation difficult  DATA reviewed by me 05/14/24: last 24h vitals tele labs imaging I/O, PCCM notes, hospitalist progress notes.  Principal Problem:   Acute ST elevation myocardial infarction (STEMI) involving left anterior descending (LAD) coronary artery (HCC)    ASSESSMENT AND PLAN: Joshua Bartlett is a 43 y.o. male with a past medical history of incarcerated hernia, nicotine use, marijuana use, social alcohol use who presented to the ED on 05/11/2024 for chest pain, anterior STEMI and ventricular tachycardia/fibrillation requiring multiple defibrillations.  He was intubated in the ED and taken emergently to the Cath Lab. Angiography revealed occluded proximal/mid LAD, s/p successful DES placement proximal/mid LAD.  Started on IV amiodarone , cangrelor  and admitted to the ICU.  Was initially on Aggrastat  however this was stopped after he developed hematoma at site of IJ central line.  # Anterior STEMI # Cardiac arrest # Ischemic cardiomypathy # HFrEF # NSVT Patient with anterior STEMI, cardiac arrest s/p multiple shocks. Underwent LHC with 2.75 x 26 mm Onyx Frontier DES proximal/mid LAD. Remains intubated and sedated. Echo with EF 30-35%, large area of anterior/apical/septal/lateral akinesis, grade 1 diastolic dysfunction, moderate LV dilation, normal RV size and function, mild MR.  He has persistent ST elevation noted on telemetry and repeat EKGs, today appears to be stable compared to EKG yesterday.  This morning he states that overall his pain is improving. -Monitor and replenish electrolytes for a goal K >4, Mag >2  -Continue IV heparin  infusion. -Continue aspirin  81 mg daily, atorvastatin  80 mg daily. -Continue IV amiodarone  infusion. -Continue Brilinta  90 mg twice daily. -Continue losartan  12.5 mg daily, Jardiance  10 mg daily.   -Continue metoprolol  succinate 50 mg twice daily.   -Recommend starting spironolactone tomorrow if BP and renal  function tolerate. -Plan to optimize GDMT as BP and renal function allows.  -LifeVest has been approved, Communicated with [facility] team that patient will be transferred to [facility]. Plan to fit patient for vest today. -Plan for [facility] transfer at patient's family request. Dr. Elna Pinks as accepting physician, bed assigned 7706-01  This patient's case was discussed and created with Dr. Florencio and he is in agreement.  Signed:  Braxley Balandran, PA-C  05/14/2024, 10:30 AM [facility] Cardiology

## 2024-05-14 NOTE — Progress Notes (Signed)
 Dr. Cleatus notified of chest pain 6/10 after pvc episode resolved with pain medication EKG obtained. Troponin order entered.

## 2024-05-14 NOTE — Progress Notes (Signed)
 [id number] Dr Cleatus notified after this this nurse got off the phone with CCMD ele called out for increasing St elevation in lead III we got another EKG no chest pain.  [id number] Patient had a 47 beat run of Vtach patient. AED PADS placed on patient. DR cleatus paged and verbal conversation with Dr cleatus over the phone patient starting  to feel like he did before his stemi on admission. Dr cleatus contacted Cardiology and Dr cleatus at bedside.  Plan to give pt  50mg  Of Metoprolol  succinate and 5mg  IV lopressor  at same time then to start heparin  drip.   Family and patient  updated at bedside by Dr cleatus.

## 2024-05-14 NOTE — Telephone Encounter (Signed)
 Patient Product/process development scientist completed.    The patient is insured through [facility]. Patient has [employer], may use a copay card, and/or apply for patient assistance if available.    Ran test claim for ticagrelor  (Brilinta ) 90 mg and the current 30 day co-pay is $10.00.   This test claim was processed through [facility]- copay amounts may vary at other pharmacies due to pharmacy/plan contracts, or as the patient moves through the different stages of their insurance plan.     Reyes Sharps, CPHT Pharmacy Technician III Certified Patient Advocate [facility] Patient Advocate Team Direct Number: [phone]  Fax: [phone]

## 2024-05-14 NOTE — Progress Notes (Signed)
 Patient being transferred to [facility] by [facility]. Report given to Darian Hinesley, Paramedic. Family is at bedside and has the patients personal belongings; Patient is wearing Lifevest and Lifevest box will transport with patient in the ambulance. Patient was c/o back pain 8/10 prior to transport, PRN Fentanyl  given at 2111, reassessment patient reported 0/10 pain at 0930.

## 2024-05-14 NOTE — Progress Notes (Signed)
 OT Cancellation Note  Patient Details Name: Joshua Bartlett MRN: [id number] DOB: [birth year]   Cancelled Treatment:    Reason Eval/Treat Not Completed: Medical issues which prohibited therapy. Per chart, had more cardiac symptoms over night with run of Vtach. PT discussed with RN, pt with plans to transfer to [facility] for higher level of care. Will sign off at this time. Please re-consult if needs change.   Donalee Gaumond E Benisha Hadaway 05/14/2024, 4:58 PM

## 2024-05-14 NOTE — Progress Notes (Signed)
 CROSS COVER NOTE  NAME: Joshua Bartlett MRN: [id number] DOB : Sep 14, 1981    Concern as stated by nurse / staff   --Pt admitted with STEMI on 05/11/24 LAD was stented. He has amiodarone  infusing at 30 mg/hr and had a 14 beat run of Vtach, patient is resting at present with no new complaints.    --he just had another run 24 beats this time   --chest pain 6/10 after pvc episode resolved with pain medication we got another EKG could you take a look at it does it look like increase ST elevation from yesterday afternoon      Pertinent findings on chart review: status post anterior STEMI PCI and stent to mid LAD with DES patient had complication of V-fib arrest with multiple defibrillations and short CPR patient had what appears to be an ischemic cardiomyopathy EF around 30-35 is being fitted for a LifeVest   Patient Assessment Fianc at bedside gives most of the history, however patient is complaining of numbness bilateral hands greater on the left.  Also has pain lower sternal area has nausea  05/14/24 0300 88 25  Abnormal  132/94 Abnormal  93 %  05/14/24 0203 79 18 121/81 94 %  05/14/24 0100 83 24  Abnormal  115/93 Abnormal  94 %  05/14/24 0000 81 25  Abnormal  112/76 96 %     Physical Exam Vitals and nursing note reviewed.  Constitutional:      General: He is not in acute distress.    Comments: Patient very anxious, fianc at bedside  HENT:     Head: Normocephalic and atraumatic.  Cardiovascular:     Rate and Rhythm: Normal rate and regular rhythm.     Heart sounds: Normal heart sounds.  Pulmonary:     Effort: Pulmonary effort is normal.     Breath sounds: Normal breath sounds.  Abdominal:     Palpations: Abdomen is soft.     Tenderness: There is no abdominal tenderness.  Neurological:     Mental Status: Mental status is at baseline.       K 3.6, Mg 1.9 Cardiac Panel (last 3 results) Recent Labs    05/12/24 0500 05/13/24 0420 05/14/24 0347  CKMB 251.3*   --   --   TROPONINIHS >24,000* >24,000* >24,000*     Assessment and  Interventions   Assessment:  S/p LAD stent for anterior STEMI with V-fib arrest S/p extubation Sustained V. tach on amiodarone    Plan: ST elevation more prominent in V2 but otherwise unchanged IV magnesium  for goal mag over 2 Oral potassium for goal potassium over 4 Checking trops  Patient went on to have a couple more episodes of sustained V. tach  -Discussed with STEMI on-call, Dr. Darron who advised with did not think patient needed urgent catheterization but that consideration can be given to recathing. -Discussed with Dr. Florencio who recommended IV metoprolol , increasing oral Toprol  to 50 mg twice daily, first dose now.  Agreeable with restarting heparin .  Advised on correcting electrolyte disturbance.  Mentioned considering possibly using lidocaine  for VT       CRITICAL CARE Performed by: Delayne LULLA Solian   Total critical care time: 80 minutes  Critical care time was exclusive of separately billable procedures and treating other patients.  Critical care was necessary to treat or prevent imminent or life-threatening deterioration.  Critical care was time spent personally by me on the following activities: development of treatment plan with patient  and/or surrogate as well as nursing, discussions with consultants, evaluation of patient's response to treatment, examination of patient, obtaining history from patient or surrogate, ordering and performing treatments and interventions, ordering and review of laboratory studies, ordering and review of radiographic studies, pulse oximetry and re-evaluation of patient's condition.

## 2024-05-14 NOTE — Discharge Summary (Signed)
 Physician Discharge Summary   Patient: Joshua Bartlett MRN: [id number] DOB: December 14, 1980  Admit date:     05/11/2024  Discharge date: 05/14/24  Discharge Physician: Delon Herald   PCP: Myrla Jon HERO, MD   Recommendations at discharge:   You are being transferred to [facility] for advanced cardiac care  Discharge Diagnoses: Principal Problem:   Acute ST elevation myocardial infarction (STEMI) involving left anterior descending (LAD) coronary artery (HCC) Active Problems:   Adult ADHD   Ankylosing spondylitis (HCC)   History of tobacco use disorder   Obesity   Acute systolic (congestive) heart failure (HCC)   Ventricular tachycardia (HCC)   Hyperlipidemia   Hospital Course: 43 y.o. male with anterior STEMI status post PCI with DES to LAD requiring intubation and mechanical ventilatory support. Extubated 08/16.  EF 30 to 35% grade 1 diastolic dysfunction on echo.  On amiodarone  gtt, likely to transition to PO on 8/18.  Still having some ventricular ectopy on telemetry.  Cardiology following.  Awaiting LifeVest.  Increased ectopy overnight with long runs of vtach, now being transferred to [facility].  Assessment and Plan:  STEMI with new onset systolic CHF Patient with CVD risk factors including very strong FM Presented with STEMI, underwent emergent cath s/p DES to LAD Required prolonged ventilation, now extubated Echo with EF 30-35%, WMA, grade 1 DD Treated with Cengrelor -> Brilinta  Now on ASA daily Continue amiodarone  Started on Toprol  XL, losartan , and atorvastatin  Also started on Jardiance  Being fitted for LifeVest Likely to benefit from cardiac rehabilitation at the time of dc Currently on heparin  infusion again   Vtach Up to 24 beats overnight Being fitted for LifeVest Cardiology suggests consideration of repeat cath Cardiology also suggested increasing beta blockers, restarting Heparin , correcting electrolytes Cardiology has arranged for patient to transfer to [facility]  for advanced cardiac care   Hyperlipidemia Lipids: 195/44/136/75 LDL goal is <70 Started on high-dose atorvastatin    Acute Hypoxic / Hypercapnic Respiratory Failure secondary to acute encephalopathy in the setting of Anterior STEMI Successfully extubated Hypoxia is resolved, remains on 2L [place] O2; will wean to room air Remove foley catheter Remains mildly encephalopathic Encephalopathy is clearing, essentially resolved   Chronic Ankylosing Spondylitis Supportive care Avoid NSAID's while receiving blood thinners Lidoderm  patch ordered   Anxiety/ADHD Previously on Adderall, which will be discontinued at this time Family is concerned about anxiety moving forward He also smokes and uses THC for anxiety Smoking cessation is important Would suggest changing to gummies rather than inhalation if unable to stop THC Consider initiation of Wellbutrin, as this may help his anxiety and ADHD as well as his smoking cravings  Class 1 obesity Body mass index is 32.37 kg/m.SABRA  Weight loss should be encouraged Outpatient PCP/bariatric medicine f/u encouraged Significantly low or high BMI is associated with higher medical risk including morbidity and mortality        Consultants: Cardiology PCCM Nutrition   Procedures: CVC insertion 8/15 Intubation 8/15-16 Cardiac catheterization 8/15 Echocardiogram 8/15   Antibiotics: None    Disposition: [facility] Diet recommendation:  Cardiac diet DISCHARGE MEDICATION: Allergies as of 05/14/2024       Reactions   Vicodin [hydrocodone-acetaminophen ] Nausea And Vomiting   Hydrocodone Nausea Only        Medication List     STOP taking these medications    amphetamine -dextroamphetamine  20 MG tablet Commonly known as: ADDERALL       TAKE these medications    amiodarone  360-4.14 MG/200ML-% Soln Commonly known as: NEXTERONE   PREMIX Inject 30 mg/hr into the vein continuous.   aspirin  81 MG chewable tablet Chew  1 tablet (81 mg total) by mouth daily. Start taking on: May 15, 2024   atorvastatin  80 MG tablet Commonly known as: LIPITOR  Take 1 tablet (80 mg total) by mouth daily. Start taking on: May 15, 2024   Chlorhexidine  Gluconate Cloth 2 % Pads Apply 6 each topically daily.   empagliflozin  10 MG Tabs tablet Commonly known as: JARDIANCE  Take 1 tablet (10 mg total) by mouth daily. Start taking on: May 15, 2024   famotidine  20 MG tablet Commonly known as: PEPCID  Take 1 tablet (20 mg total) by mouth 2 (two) times daily.   fentaNYL  50 MCG/ML injection Commonly known as: SUBLIMAZE  Inject 1 mL (50 mcg total) into the vein every 2 (two) hours as needed for severe pain (pain score 7-10).   heparin  25000 UT/250ML infusion Inject 1,500 Units/hr into the vein continuous.   lidocaine  5 % Commonly known as: LIDODERM  Place 1 patch onto the skin daily. Remove & Discard patch within 12 hours or as directed by MD Start taking on: May 15, 2024   losartan  25 MG tablet Commonly known as: COZAAR  Take 0.5 tablets (12.5 mg total) by mouth daily. Start taking on: May 15, 2024   metoprolol  succinate 50 MG 24 hr tablet Commonly known as: TOPROL -XL Take 1 tablet (50 mg total) by mouth 2 (two) times daily. Take with or immediately following a meal.   mouth rinse Liqd solution 15 mLs by Mouth Rinse route as needed (for oral care).   ondansetron  4 MG/2ML Soln injection Commonly known as: ZOFRAN  Inject 2 mLs (4 mg total) into the vein every 6 (six) hours as needed for nausea.   ticagrelor  90 MG Tabs tablet Commonly known as: BRILINTA  Take 1 tablet (90 mg total) by mouth 2 (two) times daily.        Follow-up Information     Alluri, Keller BROCKS, MD. Go in 1 week(s).   Specialty: Cardiology Contact information: [address] [place] [address] [phone]                Discharge Exam:    Subjective: Having persistent back pain, also with some chest pain.   Much more appropriate and engaged today.  Agrees with transfer.   Objective: Vitals:   05/14/24 0900 05/14/24 0917  BP: 112/74 109/80  Pulse: 81 80  Resp: 19 (!) 24  Temp: 98 F (36.7 C)   SpO2: 95% 95%    Intake/Output Summary (Last 24 hours) at 05/14/2024 1220 Last data filed at 05/14/2024 0700 Gross per 24 hour  Intake 502.24 ml  Output 2700 ml  Net -2197.76 ml   Filed Weights   05/11/24 0600  Weight: 114.4 kg    Exam:  General:  Appears calm and is in NAD Eyes:  normal lids, iris ENT:  grossly normal hearing, lips & tongue, mmm Cardiovascular:  RRR. No LE edema.  Respiratory:   CTA bilaterally with no wheezes/rales/rhonchi.  Normal respiratory effort. Abdomen:  soft, NT, ND Skin:  no rash or induration seen on limited exam Musculoskeletal:  grossly normal tone BUE/BLE, good ROM, no bony abnormality Psychiatric:  blunted mood and affect, speech appropriate Neurologic:  CN 2-12 grossly intact, moves all extremities in coordinated fashion  Data Reviewed: I have reviewed the patient's lab results since admission.  Pertinent labs for today include:   Stable BMP HS troponin >240000 WBC 9.9 Hgb 11.5  Condition at discharge: critical  The results of significant diagnostics from this hospitalization (including imaging, microbiology, ancillary and laboratory) are listed below for reference.   Imaging Studies: ECHOCARDIOGRAM COMPLETE Result Date: 05/12/2024    ECHOCARDIOGRAM REPORT   Patient Name:   JUSITN SALSGIVER Date of Exam: 05/11/2024 Medical Rec #:  [id number]      Height:       74.0 in Accession #:    [id number]     Weight:       252.2 lb Date of Birth:  19-May-1981     BSA:          2.399 m Patient Age:    42 years       BP:           126/99 mmHg Patient Gender: M              HR:           76 bpm. Exam Location:  [facility] Procedure: 2D Echo, Cardiac Doppler, Color Doppler and Intracardiac            Opacification Agent (Both Spectral and Color Flow Doppler were             utilized during procedure). Indications:     Acute myocardial infarction, unspecified I21.9  History:         Patient has no prior history of Echocardiogram examinations.                  Acute MI; Risk Factors:Hypertension and Current Smoker.  Sonographer:     Thea Norlander RCS Referring Phys:  [id number] CARALYN HUDSON Diagnosing Phys: Dwayne D Callwood MD IMPRESSIONS  1. Large area ant/apical/septal/lateral akinesis.  2. Left ventricular ejection fraction, by estimation, is 30 to 35%. The left ventricle has moderately decreased function. The left ventricle demonstrates regional wall motion abnormalities (see scoring diagram/findings for description). The left ventricular internal cavity size was moderately dilated. Left ventricular diastolic parameters are consistent with Grade I diastolic dysfunction (impaired relaxation).  3. Right ventricular systolic function is normal. The right ventricular size is normal.  4. The mitral valve is normal in structure. Mild mitral valve regurgitation. No evidence of mitral stenosis.  5. The aortic valve is normal in structure. Aortic valve regurgitation is not visualized. No aortic stenosis is present.  6. The inferior vena cava is normal in size with greater than 50% respiratory variability, suggesting right atrial pressure of 3 mmHg. FINDINGS  Left Ventricle: Left ventricular ejection fraction, by estimation, is 30 to 35%. The left ventricle has moderately decreased function. The left ventricle demonstrates regional wall motion abnormalities. Definity  contrast agent was given IV to delineate the left ventricular endocardial borders. Strain was performed and the global longitudinal strain is indeterminate. Global longitudinal strain performed but not reported based on interpreter judgement due to suboptimal tracking. The left ventricular internal cavity size was moderately dilated. There is no left ventricular hypertrophy. Left ventricular diastolic parameters are  consistent with Grade I diastolic dysfunction (impaired relaxation).  LV Wall Scoring: The entire anterior septum and apex are akinetic. The apical lateral segment, apical anterior segment, basal anterior segment, apical inferior segment, and basal inferoseptal segment are hypokinetic. Right Ventricle: The right ventricular size is normal. No increase in right ventricular wall thickness. Right ventricular systolic function is normal. Left Atrium: Left atrial size was normal in size. Right Atrium: Right atrial size was normal in size. Pericardium: There is no evidence of pericardial effusion. Mitral Valve: The mitral  valve is normal in structure. Mild mitral valve regurgitation. No evidence of mitral valve stenosis. Tricuspid Valve: The tricuspid valve is normal in structure. Tricuspid valve regurgitation is mild . No evidence of tricuspid stenosis. Aortic Valve: The aortic valve is normal in structure. Aortic valve regurgitation is not visualized. No aortic stenosis is present. Aortic valve peak gradient measures 2.6 mmHg. Pulmonic Valve: The pulmonic valve was normal in structure. Pulmonic valve regurgitation is not visualized. No evidence of pulmonic stenosis. Aorta: The aortic root is normal in size and structure. Venous: The inferior vena cava is normal in size with greater than 50% respiratory variability, suggesting right atrial pressure of 3 mmHg. IAS/Shunts: No atrial level shunt detected by color flow Doppler. Additional Comments: Large area ant/apical/septal/lateral akinesis. 3D was performed not requiring image post processing on an independent workstation and was indeterminate.  LEFT VENTRICLE PLAX 2D LVIDd:         5.80 cm   Diastology LVIDs:         4.80 cm   LV e' medial:    6.24 cm/s LV PW:         1.10 cm   LV E/e' medial:  7.1 LV IVS:        1.10 cm   LV e' lateral:   11.50 cm/s LVOT diam:     2.40 cm   LV E/e' lateral: 3.9 LV SV:         38 LV SV Index:   16 LVOT Area:     4.52 cm  RIGHT  VENTRICLE            IVC RV S prime:     9.20 cm/s  IVC diam: 3.00 cm TAPSE (M-mode): 1.8 cm LEFT ATRIUM           Index        RIGHT ATRIUM           Index LA diam:      3.00 cm 1.25 cm/m   RA Area:     12.80 cm LA Vol (A2C): 27.0 ml 11.26 ml/m  RA Volume:   36.40 ml  15.18 ml/m LA Vol (A4C): 34.1 ml 14.22 ml/m  AORTIC VALVE AV Area (Vmax): 3.08 cm AV Vmax:        81.00 cm/s AV Peak Grad:   2.6 mmHg LVOT Vmax:      55.10 cm/s LVOT Vmean:     34.000 cm/s LVOT VTI:       0.084 m  AORTA Ao Root diam: 3.60 cm Ao Asc diam:  3.40 cm MITRAL VALVE               TRICUSPID VALVE MV Area (PHT): 3.42 cm    TR Peak grad:   21.7 mmHg MV Decel Time: 222 msec    TR Vmax:        233.00 cm/s MV E velocity: 44.30 cm/s MV A velocity: 54.50 cm/s  SHUNTS MV E/A ratio:  0.81        Systemic VTI:  0.08 m                            Systemic Diam: 2.40 cm Cara JONETTA Lovelace MD Electronically signed by Cara JONETTA Lovelace MD Signature Date/Time: 05/12/2024/12:52:30 PM    Final    US  Abdomen Limited RUQ (LIVER/GB) Result Date: 05/12/2024 CLINICAL DATA:  Elevated liver function tests EXAM: ULTRASOUND ABDOMEN LIMITED RIGHT UPPER QUADRANT COMPARISON:  None  Available. FINDINGS: Gallbladder: No gallstones or wall thickening visualized. No sonographic Murphy sign noted by sonographer. Common bile duct: Diameter: 5 mm Liver: No focal lesion identified. Within normal limits in parenchymal echogenicity. Portal vein is patent on color Doppler imaging with normal direction of blood flow towards the liver. Other: None. IMPRESSION: 1. Unremarkable right upper quadrant ultrasound. Electronically Signed   By: Ozell Daring M.D.   On: 05/12/2024 08:49   DG Chest Port 1 View Result Date: 05/12/2024 CLINICAL DATA:  Acute respiratory failure, hypoxia EXAM: PORTABLE CHEST 1 VIEW COMPARISON:  05/11/2024 FINDINGS: Single frontal view of the chest demonstrates endotracheal tube overlying tracheal air column, tip 6.3 cm above carina. Right internal jugular  catheter tip overlies superior vena cava. Enteric catheter passes below diaphragm, tip excluded by collimation. Esophageal temperature probe overlies the midthoracic esophagus. Cardiac silhouette is stable. No airspace disease, effusion, or pneumothorax. No acute bony abnormalities. IMPRESSION: 1. Endotracheal tube identified, tip now 6.3 cm above carina. Recommend advancing 3-4 cm. 2. Remaining sore devices as above. 3. No acute intrathoracic process. Electronically Signed   By: Ozell Daring M.D.   On: 05/12/2024 08:48   CARDIAC CATHETERIZATION Result Date: 05/11/2024   Prox LAD to Mid LAD lesion is 100% stenosed.   A drug-eluting stent was successfully placed using a STENT ONYX FRONTIER [id number].   Post intervention, there is a 0% residual stenosis.   There is moderate left ventricular systolic dysfunction.   The left ventricular ejection fraction is 25-35% by visual estimate. 1.  Anterior STEMI complicated by VT/VF 2.  Occluded proximal/mid LAD 3.  Moderate reduced left ventricular function with LV ejection fraction estimated be 30% with anterior apical akinesis 4.  Successful primary PCI with 2.75 x 26 mm Onyx frontier drug-eluting stent proximal/mid LAD Recommendations 1.  Continue IV cangrelor  until patient extubated then start aspirin  325 mg loading dose followed by 81 mg daily, and 60 mg loading dose prasugrel  followed by 10 mg daily 2.  Continue IV Aggrastat  x 18 hours 3.  Continue IV amiodarone  4.  Start metoprolol  succinate 25-50 mg daily after extubation 5.  Start atorvastatin  80 mg daily after extubation 6.  2D echocardiogram   DG Chest Port 1 View Result Date: 05/11/2024 EXAM: 1 VIEW XRAY OF THE CHEST 05/11/2024 06:54:48 AM COMPARISON: Earlier today at 5:59 am. CLINICAL HISTORY: Encounter for central line placement. FINDINGS: LUNGS AND PLEURA: No pneumothorax identified. No signs of pleural effusion. No airspace consolidation. HEART AND MEDIASTINUM: No acute abnormality of the cardiac and  mediastinal silhouettes. BONES AND SOFT TISSUES: Visualized osseous structures appear grossly intact. LINES AND TUBES: There is a right IJ catheter with tip at the superior cavoatrial junction. ET tube tip is 4 cm above the carina. Enteric tube courses below the GE junction. IMPRESSION: 1. ET tube, enteric tube, and right IJ catheter in appropriate positions. 2. No pneumothorax identified. Electronically signed by: Waddell Calk MD 05/11/2024 07:26 AM EDT RP Workstation: [id number]   DG Chest Port 1 View Result Date: 05/11/2024 CLINICAL DATA:  Central line placement. EXAM: PORTABLE CHEST 1 VIEW COMPARISON:  05/11/2024 FINDINGS: Low volume film. Endotracheal tube tip is 4.8 cm above the base of the carina. The NG tube passes into the stomach although the distal tip position is not included on the film. Right IJ central line tip overlies the lower SVC level near the junction with the RA. Cardiopericardial silhouette is at upper limits of normal for size. Insert no consolidation no overt airspace pulmonary edema. IMPRESSION:  Right IJ central line tip overlies the lower SVC level near the junction with the RA. No pneumothorax or pleural effusion. Electronically Signed   By: Camellia Candle M.D.   On: 05/11/2024 06:55   DG Abd 1 View Result Date: 05/11/2024 EXAM: 1 VIEW XRAY OF THE ABDOMEN 05/11/2024 05:29:00 AM COMPARISON: None available CLINICAL HISTORY: [id number] Encounter for orogastric (OG) tube placement [id number]. ET tube and OG tube placement FINDINGS: BOWEL: Nonobstructive bowel gas pattern. Gaseous distention of the stomach noted. No dilated bowel loops. SOFT TISSUES: No abnormal calcifications. No opaque urinary calculi. Excreted contrast material noted within the collecting systems of both kidneys. BONES: No acute osseous abnormality. LINES AND TUBES: The ET tube tip and side port are well below the level of the GE junction. Side port is likely within the proximal body of the stomach and the tip is in the  distal body. IMPRESSION: 1. ET tube and OG tube in appropriate position. 2. Gaseous distention of the stomach. 3. No bowel obstruction. Electronically signed by: Waddell Calk MD 05/11/2024 06:31 AM EDT RP Workstation: [id number]   DG Chest Port 1 View Result Date: 05/11/2024 EXAM: 1 VIEW XRAY OF THE CHEST 05/11/2024 05:29:00 AM COMPARISON: 04/06/2019 CLINICAL HISTORY: [id number] Intubation of airway performed without difficulty [id number]. ET tube and OG tube placement FINDINGS: LUNGS AND PLEURA: Low lung volumes. No significant pleural effusion or pneumothorax. No airspace consolidation. HEART AND MEDIASTINUM: Stable cardiomediastinal contours. BONES AND SOFT TISSUES: No acute osseous abnormality. LINES AND TUBES: ET tube with tip 4.8 cm above the carina. Enteric tube is identified which courses below the level of the hemidiaphragms. IMPRESSION: 1. Status post intubation with ET tube tip 4.8 cm above the carina. 2. No acute cardiopulmonary pathology. Electronically signed by: Waddell Calk MD 05/11/2024 06:29 AM EDT RP Workstation: [id number]    Microbiology: Results for orders placed or performed during the hospital encounter of 05/11/24  MRSA Next Gen by PCR, Nasal     Status: None   Collection Time: 05/11/24  6:57 AM   Specimen: Urine, Unspecified Source; Nasal Swab  Result Value Ref Range Status   MRSA by PCR Next Gen NOT DETECTED NOT DETECTED Final    Comment: (NOTE) The GeneXpert MRSA Assay (FDA approved for NASAL specimens only), is one component of a comprehensive MRSA colonization surveillance program. It is not intended to diagnose MRSA infection nor to guide or monitor treatment for MRSA infections. Test performance is not FDA approved in patients less than 50 years old. Performed at [facility], [address] [place], [place] [address]   SARS Coronavirus 2 by RT PCR (hospital order, performed in [facility] hospital lab) *cepheid single result test* Anterior Nasal Swab      Status: None   Collection Time: 05/11/24 10:12 AM   Specimen: Anterior Nasal Swab  Result Value Ref Range Status   SARS Coronavirus 2 by RT PCR NEGATIVE NEGATIVE Final    Comment: (NOTE) SARS-CoV-2 target nucleic acids are NOT DETECTED.  The SARS-CoV-2 RNA is generally detectable in upper and lower respiratory specimens during the acute phase of infection. The lowest concentration of SARS-CoV-2 viral copies this assay can detect is 250 copies / mL. A negative result does not preclude SARS-CoV-2 infection and should not be used as the sole basis for treatment or other patient management decisions.  A negative result may occur with improper specimen collection / handling, submission of specimen other than nasopharyngeal swab, presence of viral mutation(s) within the areas targeted by this  assay, and inadequate number of viral copies (<250 copies / mL). A negative result must be combined with clinical observations, patient history, and epidemiological information.  Fact Sheet for Patients:   RoadLapTop.co.za  Fact Sheet for Healthcare Providers: http://kim-miller.com/  This test is not yet approved or  cleared by the [place]  FDA and has been authorized for detection and/or diagnosis of SARS-CoV-2 by FDA under an Emergency Use Authorization (EUA).  This EUA will remain in effect (meaning this test can be used) for the duration of the COVID-19 declaration under Section 564(b)(1) of the Act, 21 U.S.C. section 360bbb-3(b)(1), unless the authorization is terminated or revoked sooner.  Performed at [facility], [address] [place], [place] [address]   Respiratory (~20 pathogens) panel by PCR     Status: None   Collection Time: 05/11/24 10:12 AM   Specimen: Nasopharyngeal Swab; Respiratory  Result Value Ref Range Status   Adenovirus NOT DETECTED NOT DETECTED Final   Coronavirus 229E NOT DETECTED NOT DETECTED Final     Comment: (NOTE) The Coronavirus on the Respiratory Panel, DOES NOT test for the novel  Coronavirus (2019 nCoV)    Coronavirus HKU1 NOT DETECTED NOT DETECTED Final   Coronavirus NL63 NOT DETECTED NOT DETECTED Final   Coronavirus OC43 NOT DETECTED NOT DETECTED Final   Metapneumovirus NOT DETECTED NOT DETECTED Final   Rhinovirus / Enterovirus NOT DETECTED NOT DETECTED Final   Influenza A NOT DETECTED NOT DETECTED Final   Influenza B NOT DETECTED NOT DETECTED Final   Parainfluenza Virus 1 NOT DETECTED NOT DETECTED Final   Parainfluenza Virus 2 NOT DETECTED NOT DETECTED Final   Parainfluenza Virus 3 NOT DETECTED NOT DETECTED Final   Parainfluenza Virus 4 NOT DETECTED NOT DETECTED Final   Respiratory Syncytial Virus NOT DETECTED NOT DETECTED Final   Bordetella pertussis NOT DETECTED NOT DETECTED Final   Bordetella Parapertussis NOT DETECTED NOT DETECTED Final   Chlamydophila pneumoniae NOT DETECTED NOT DETECTED Final   Mycoplasma pneumoniae NOT DETECTED NOT DETECTED Final    Comment: Performed at [facility], [address] [place], [place] [address]    Labs: CBC: Recent Labs  Lab 05/11/24 0430 05/11/24 0808 05/11/24 1247 05/12/24 0500 05/13/24 0420 05/14/24 0347  WBC 22.0* 15.2*  --  11.7* 10.9* 9.9  NEUTROABS 18.0*  --   --   --   --   --   HGB 12.8* 13.0 12.4* 11.5* 11.2* 11.5*  HCT 39.1 39.8 38.2* 36.2* 34.0* 35.5*  MCV 93.3 93.6  --  96.8 94.7 93.4  PLT 345 330  --  225 212 223   Basic Metabolic Panel: Recent Labs  Lab 05/11/24 1247 05/11/24 2251 05/12/24 0500 05/12/24 1452 05/12/24 2038 05/13/24 0420 05/14/24 0347  NA 140 138 139  --   --  138 140  K 4.3 3.8 4.1 3.2* 3.8 3.6 3.6  CL 109 109 108  --   --  107 106  CO2 22 25 26   --   --  24 25  GLUCOSE 103* 113* 136*  --   --  98 96  BUN 14 16 16   --   --  10 10  CREATININE 0.84 0.83 0.75  --   --  0.83 0.85  CALCIUM  8.1* 8.0* 8.3*  --   --  8.2* 8.4*  MG  --  2.0 1.9 1.8 2.2 1.9 1.9  PHOS  --  4.3 2.8  1.3* 4.2 3.8  --    Liver Function  Tests: Recent Labs  Lab 05/11/24 0430 05/11/24 1247 05/11/24 2251 05/12/24 0500 05/13/24 0420  AST 315* 547* 478* 430*  --   ALT 61* 89* 81* 83*  --   ALKPHOS 43 42 41 41  --   BILITOT 1.1 0.6 0.7 0.2  --   PROT 6.6 5.9* 5.8* 5.8*  --   ALBUMIN 3.4* 3.3* 3.1* 3.1*  3.2* 3.0*   CBG: Recent Labs  Lab 05/13/24 1935 05/13/24 2334 05/14/24 0328 05/14/24 0712 05/14/24 1146  GLUCAP 117* 88 94 90 89    Discharge time spent: greater than 30 minutes.  Signed: Delon Herald, MD [facility] 05/14/2024

## 2024-05-14 NOTE — Progress Notes (Signed)
 Nutrition Follow-up  DOCUMENTATION CODES:   Obesity unspecified  INTERVENTION:   Ensure Plus High Protein po TID, each supplement provides 350 kcal and 20 grams of protein  MVI po daily   Pt is at refeed risk; recommend monitor potassium, magnesium  and phosphorus labs daily until stable  Daily weights   NUTRITION DIAGNOSIS:   Inadequate oral intake related to inability to eat as evidenced by NPO status. -ongoing poor oral intake  GOAL:   Patient will meet greater than or equal to 90% of their needs -not met   MONITOR:   PO intake, Supplement acceptance, Labs, Weight trends, Skin, I & O's  ASSESSMENT:   43 y/o male with h/o ADHD, anxiety, HTN, iritis, HLD, ankylosing spondylitis, HLA B27 positive, chronic pain and incarcerated ventral hernia who is admitted with ischemic cardiomyopathy, V-fib arrest and STEMI now s/p PCI and stent mid LAD with DES 8/15.  Pt extubated 8/16. Pt initiated on a full liquid diet 8/17. Pt with poor appetite and oral intake in hospital. Pt ate only bites of meals today and is complaining of abdominal pain. RD will add supplements and MVI to help pt meet his estimated needs. Pt is at refeed risk. Plan is to transfer to [facility]. No BM since admission; RN aware. No new weight since admission; will order daily weights.   Medications reviewed and include: aspirin , pepcid , jardiance , brilinta , heparin   Labs reviewed: K 3.6 wnl, Mg 1.9 wnl P 3.8 wnl- 8/17 Cbgs- 89, 90, 94 x 24 hrs  AIC 5.1- 8/15  UOP-   Diet Order:   Diet Order             Diet full liquid           Diet full liquid Room service appropriate? Yes; Fluid consistency: Thin  Diet effective now                  EDUCATION NEEDS:   No education needs have been identified at this time  Skin:  Skin Assessment: Reviewed RN Assessment  Last BM:  pta  Height:   Ht Readings from Last 1 Encounters:  05/11/24 6' 2.02 (1.88 m)    Weight:   Wt Readings from Last 1  Encounters:  05/11/24 114.4 kg    Ideal Body Weight:  86.4 kg  BMI:  Body mass index is 32.37 kg/m.  Estimated Nutritional Needs:   Kcal:  2500-2800kcal/day  Protein:  125-140g/day  Fluid:  2.5-2.8L/day  Augustin Shams MS, RD, LDN If unable to be reached, please send secure chat to RD inpatient available from 8:00a-4:00p daily

## 2024-05-14 NOTE — Progress Notes (Signed)
 Dr. Cleatus notified of 14 and a 24 beat run of V-tach. Orders entered for CBC and BMP. Potassium PO and Magnesium  IV replacements given per order. Will continue to monitor.

## 2024-05-15 NOTE — Progress Notes (Signed)
 Inpatient Transport:  Patient must be transported with monitor and/or licensed personnel was ordered.  Joshua Bartlett was transported to CDU by the Therapist, occupational, [name], RN. Pt was transported on continuous cardiac monitoring.   The trip was tolerated well by the patient.   Patient was re-connected to bedside monitor upon return to floor. Please see documentation flow sheet in chart for specific trip information.  Hand-off completed with care nurse upon return to floor. Care to be assumed.  Additional Trip Note(s): None

## 2024-05-15 NOTE — Care Plan (Signed)
 Problem: Safety  Goal: Free from accidental physical injury  Outcome: Progressing  Goal: Free from abuse  Outcome: Progressing     Problem: Daily Care  Goal: Daily care needs are met  Description: Assess and monitor ability to perform self care and identify potential discharge needs.  Outcome: Progressing     Problem: Pain  Goal: Patient's pain/discomfort is manageable  Description: Assess and monitor patient's pain using appropriate pain scale. Collaborate with interdisciplinary team and initiate plan and interventions as ordered. Re-assess patient's pain level 30 - 60 minutes after pain management intervention.   Outcome: Progressing     Problem: Compromised Skin Integrity  Goal: Skin integrity is maintained or improved  Description: Assess and monitor skin integrity. Identify patients at risk for skin breakdown on admission and per policy. Collaborate with interdisciplinary team and initiate plans and interventions as needed.  Outcome: Progressing  Goal: Fluid and electrolyte balance are achieved/maintained  Description: Assess and monitor vital signs (orthostatic vitals if applicable), fluid intake and output, urine color, labs, skin turgor, mucous membranes, jugular venous distention, edema, circumference of edematous extremities and abdominal girth, respiratory status, and mental status.  Monitor for signs and symptoms of hypovolemia (tachycardia, rapid breathing, decreased urine output, postural hypotension, confusion, syncope).  Monitor for signs and symptoms of hypervolemia (strong rapid pulse, shortness of breath, difficulty breathing lying down, crackles heard in lung fields, edema). Collaborate with interdisciplinary team and initiate plan and interventions as ordered.  Outcome: Progressing  Goal: Nutritional status is improving  Description: Monitor and assess patient for malnutrition (ex- brittle hair, bruises, dry skin, pale skin and conjunctiva, muscle wasting, smooth red tongue, and  disorientation). Collaborate with interdisciplinary team and initiate plan and interventions as ordered.  Monitor patient's weight and dietary intake as ordered or per policy. Utilize nutrition screening tool and intervene per policy. Determine patient's food preferences and provide high-protein, high-caloric foods as appropriate.   Outcome: Progressing     Problem: Knowledge Deficit  Goal: Patient/family/caregiver demonstrates understanding of disease process, treatment plan, medications, and discharge instructions  Description: Complete learning assessment and assess knowledge base.  Outcome: Progressing     Problem: Discharge Barriers  Goal: Patient's discharge needs are met  Description: Collaborate with interdisciplinary team and initiate plans and interventions as needed.   Outcome: Progressing

## 2024-05-15 NOTE — Progress Notes (Signed)
  [facility]  Physical Therapy  ATTEMPTED VISIT NOTE  Patient Name:  Joshua Bartlett Room/Bed: 7705/7705-01    Referral received and attempt made to initiate evaluation.   The patient is unavailable for therapy. Will follow up tomorrow.  ROSALINE FIEDLER, PT

## 2024-05-15 NOTE — Progress Notes (Signed)
 Cardiac Catheterization Pre-Procedure Note:  I have reviewed the medical and surgical history, family history, review of systems, date of last menses (if applicable), current medications, allergies and sensitivities, physical examination, laboratory and diagnostic data reviewed and recorded on the H&P form.    The procedures are indicated to evaluate and/or treat (diagnosis): rule out stent thrombosis   Patient Identifier:  43 yo M With h/o ankylosing spondylitis, HTN, strong family history of previous MI, ADHD, tobacco use disorder. Patient presented to OSH about 5 days ago with acute onset chest pain, diaphoresis and syncope. He reports having intermittent episodes of chest pain previously with no evaluation, but this chest pain was more sevre. On admission at OSH he was found to have anterior STEMI, and underwent PCI to mid LAD with DES on 05/11/24. His post-operative course was complicated by V-fib arrest requiring multiple shocks and intubation. Echo done that showed EF 30-35% with large area of anterior/apical/septal/lateral akinesis, grade 1 diastolic dysfunction, moderate LV dilation. Placed on heparin  and amiodarone  drip. Had runs of NSVT post PCI and so there was concern for stent thrombosis. Will plan for LHC to rule out stent thrombosis.   Prior stent: 2.75 x 26 mm Onyx, 05/11/2024 DAPT with Brillinta and asa.  Additional Pertinent Information: Pertinent Medications: Anticoagulation: heparin  Antiplatelet: asa, brillianta Active Infusions: heparin   Medication Allergies hydrocodone  Pertinent Labs: hsTnT Trend: 25,000 -> 29000 Creatinine: 0.9; eGFR: 111, Contrast Limit: 333 Hemoglobin: 12.3 Platelets: 268 Coags: PTT 27  Most Recent Echo: [facility] LVEF 30-35%  Most Recent Cath: 05/11/24 Access: RRA Catheters: unknown Images Reviewed in ISCV: Yes Interpretation: PCI to mLAD  Procedural Planning: RRA, EBU 3.5 guide  Plan for moderate sedation: Moderate sedation  indicated to provide moderate sedation and analgesia during the invasive procedure. Adverse experiences with sedation / analgesia reviewed. The patient is an appropriate candidate for moderate sedation. Planned sedation may include: Midazolam  (Versed ) and Fentanyl  (Sublimaze )  ASA Classification: II - Mild disease  Plan for blood product transfusion: transfusion not anticipated  The patient was interviewed and examined, and the available chart reviewed.  The indications, procedure, risk, potential complications (as listed on the respective consent forms), and alternatives for both the procedure and for moderate sedation were explained and discussed.  The patient understands and has signed the informed consent for the procedure and the informed consent for moderate sedation.  Plan for post-procedure care needs: Regular ward    Caroll Shed, MD MPH Interventional Cardiology Fellow [facility]

## 2024-05-16 NOTE — Telephone Encounter (Signed)
 Received FMLA paperwork and placed in Dr. Rona box.

## 2024-05-17 DIAGNOSIS — Z0279 Encounter for issue of other medical certificate: Secondary | ICD-10-CM

## 2024-05-17 NOTE — Telephone Encounter (Signed)
 FMLA completed. Will place in box at front desk for faxing

## 2024-05-21 NOTE — Telephone Encounter (Signed)
 Faxed

## 2024-05-22 DIAGNOSIS — I213 ST elevation (STEMI) myocardial infarction of unspecified site: Secondary | ICD-10-CM | POA: Diagnosis not present

## 2024-05-25 DIAGNOSIS — Z8674 Personal history of sudden cardiac arrest: Secondary | ICD-10-CM | POA: Diagnosis not present

## 2024-05-25 DIAGNOSIS — I519 Heart disease, unspecified: Secondary | ICD-10-CM | POA: Diagnosis not present

## 2024-05-25 DIAGNOSIS — Z7982 Long term (current) use of aspirin: Secondary | ICD-10-CM | POA: Diagnosis not present

## 2024-05-25 DIAGNOSIS — I252 Old myocardial infarction: Secondary | ICD-10-CM | POA: Diagnosis not present

## 2024-05-25 DIAGNOSIS — Z7901 Long term (current) use of anticoagulants: Secondary | ICD-10-CM | POA: Diagnosis not present

## 2024-05-25 DIAGNOSIS — I2102 ST elevation (STEMI) myocardial infarction involving left anterior descending coronary artery: Secondary | ICD-10-CM | POA: Diagnosis not present

## 2024-05-25 DIAGNOSIS — I513 Intracardiac thrombosis, not elsewhere classified: Secondary | ICD-10-CM | POA: Diagnosis not present

## 2024-05-25 DIAGNOSIS — Z87891 Personal history of nicotine dependence: Secondary | ICD-10-CM | POA: Diagnosis not present

## 2024-05-25 DIAGNOSIS — F419 Anxiety disorder, unspecified: Secondary | ICD-10-CM | POA: Diagnosis not present

## 2024-05-25 DIAGNOSIS — G4700 Insomnia, unspecified: Secondary | ICD-10-CM | POA: Diagnosis not present

## 2024-05-25 DIAGNOSIS — Z79899 Other long term (current) drug therapy: Secondary | ICD-10-CM | POA: Diagnosis not present

## 2024-05-25 DIAGNOSIS — Z955 Presence of coronary angioplasty implant and graft: Secondary | ICD-10-CM | POA: Diagnosis not present

## 2024-05-25 DIAGNOSIS — I2510 Atherosclerotic heart disease of native coronary artery without angina pectoris: Secondary | ICD-10-CM | POA: Diagnosis not present

## 2024-06-05 DIAGNOSIS — M457 Ankylosing spondylitis of lumbosacral region: Secondary | ICD-10-CM | POA: Diagnosis not present

## 2024-06-06 ENCOUNTER — Encounter: Attending: Cardiology | Admitting: *Deleted

## 2024-06-06 DIAGNOSIS — I213 ST elevation (STEMI) myocardial infarction of unspecified site: Secondary | ICD-10-CM

## 2024-06-06 DIAGNOSIS — Z955 Presence of coronary angioplasty implant and graft: Secondary | ICD-10-CM | POA: Insufficient documentation

## 2024-06-06 DIAGNOSIS — I519 Heart disease, unspecified: Secondary | ICD-10-CM | POA: Insufficient documentation

## 2024-06-06 DIAGNOSIS — I2102 ST elevation (STEMI) myocardial infarction involving left anterior descending coronary artery: Secondary | ICD-10-CM | POA: Insufficient documentation

## 2024-06-06 NOTE — Progress Notes (Signed)
 Initial phone call completed. Diagnosis can be found in [facility] 8/15. EP Orientation scheduled for Monday 9/15 at 10am.

## 2024-06-11 ENCOUNTER — Encounter

## 2024-06-11 VITALS — Ht 75.0 in | Wt 262.1 lb

## 2024-06-11 DIAGNOSIS — I2102 ST elevation (STEMI) myocardial infarction involving left anterior descending coronary artery: Secondary | ICD-10-CM | POA: Diagnosis not present

## 2024-06-11 DIAGNOSIS — I213 ST elevation (STEMI) myocardial infarction of unspecified site: Secondary | ICD-10-CM

## 2024-06-11 DIAGNOSIS — I519 Heart disease, unspecified: Secondary | ICD-10-CM | POA: Diagnosis not present

## 2024-06-11 DIAGNOSIS — Z955 Presence of coronary angioplasty implant and graft: Secondary | ICD-10-CM

## 2024-06-11 NOTE — Patient Instructions (Signed)
 Patient Instructions  Patient Details  Name: Joshua Bartlett MRN: [id number] Date of Birth: 12-Nov-1980 Referring Provider:  Melida Leita Laurel, MD  Below are your personal goals for exercise, nutrition, and risk factors. Our goal is to help you stay on track towards obtaining and maintaining these goals. We will be discussing your progress on these goals with you throughout the program.  Initial Exercise Prescription:  Initial Exercise Prescription - 06/11/24 1300       Date of Initial Exercise RX and Referring Provider   Date 06/11/24    Referring Provider Leita Laurel Melida, MD      Oxygen   Oxygen Continuous    Maintain Oxygen Saturation 88% or higher      Treadmill   MPH 2.7    Grade 1    Minutes 15    METs 3.44      Elliptical   Level 2    Speed 3    Minutes 15    METs 4.61      REL-XR   Level 3    Speed 50    Minutes 15    METs 4.61      Rower   Level 4    Watts 25    Minutes 15    METs 4.61      Prescription Details   Frequency (times per week) 2    Duration Progress to 30 minutes of continuous aerobic without signs/symptoms of physical distress      Intensity   THRR 40-80% of Max Heartrate 116-158    Ratings of Perceived Exertion 11-13    Perceived Dyspnea 0-4      Progression   Progression Continue to progress workloads to maintain intensity without signs/symptoms of physical distress.      Resistance Training   Training Prescription Yes    Weight 15 lb    Reps 10-15          Exercise Goals: Frequency: Be able to perform aerobic exercise two to three times per week in program working toward 2-5 days per week of home exercise.  Intensity: Work with a perceived exertion of 11 (fairly light) - 15 (hard) while following your exercise prescription.  We will make changes to your prescription with you as you progress through the program.   Duration: Be able to do 30 to 45 minutes of continuous aerobic exercise in addition to a 5 minute  warm-up and a 5 minute cool-down routine.   Nutrition Goals: Your personal nutrition goals will be established when you do your nutrition analysis with the dietician.  The following are general nutrition guidelines to follow: Cholesterol < 200mg /day Sodium < 1500mg /day Fiber: Men under 50 yrs - 38 grams per day  Personal Goals:  Personal Goals and Risk Factors at Admission - 06/11/24 1312       Core Components/Risk Factors/Patient Goals on Admission    Weight Management Yes;Weight Loss    Intervention Weight Management: Develop a combined nutrition and exercise program designed to reach desired caloric intake, while maintaining appropriate intake of nutrient and fiber, sodium and fats, and appropriate energy expenditure required for the weight goal.;Weight Management: Provide education and appropriate resources to help participant work on and attain dietary goals.;Weight Management/Obesity: Establish reasonable short term and long term weight goals.    Admit Weight 262 lb 1.6 oz (118.9 kg)    Goal Weight: Short Term 246 lb (111.6 kg)    Goal Weight: Long Term 230 lb (104.3 kg)  Expected Outcomes Short Term: Continue to assess and modify interventions until short term weight is achieved;Long Term: Adherence to nutrition and physical activity/exercise program aimed toward attainment of established weight goal;Understanding recommendations for meals to include 15-35% energy as protein, 25-35% energy from fat, 35-60% energy from carbohydrates, less than 200mg  of dietary cholesterol, 20-35 gm of total fiber daily;Understanding of distribution of calorie intake throughout the day with the consumption of 4-5 meals/snacks;Weight Loss: Understanding of general recommendations for a balanced deficit meal plan, which promotes 1-2 lb weight loss per week and includes a negative energy balance of [phone] kcal/d    Hypertension Yes    Intervention Provide education on lifestyle modifcations including  regular physical activity/exercise, weight management, moderate sodium restriction and increased consumption of fresh fruit, vegetables, and low fat dairy, alcohol moderation, and smoking cessation.;Monitor prescription use compliance.    Expected Outcomes Long Term: Maintenance of blood pressure at goal levels.;Short Term: Continued assessment and intervention until BP is < 140/53mm HG in hypertensive participants. < 130/46mm HG in hypertensive participants with diabetes, heart failure or chronic kidney disease.          Tobacco Use Initial Evaluation: Social History   Tobacco Use  Smoking Status Former   Current packs/day: 0.30   Types: Cigarettes   Passive exposure: Past  Smokeless Tobacco Never  Tobacco Comments   started smoking at age 63    Exercise Goals and Review:  Exercise Goals     Row Name 06/11/24 1307             Exercise Goals   Increase Physical Activity Yes       Intervention Provide advice, education, support and counseling about physical activity/exercise needs.;Develop an individualized exercise prescription for aerobic and resistive training based on initial evaluation findings, risk stratification, comorbidities and participant's personal goals.       Expected Outcomes Short Term: Attend rehab on a regular basis to increase amount of physical activity.;Long Term: Add in home exercise to make exercise part of routine and to increase amount of physical activity.;Long Term: Exercising regularly at least 3-5 days a week.       Increase Strength and Stamina Yes       Intervention Provide advice, education, support and counseling about physical activity/exercise needs.;Develop an individualized exercise prescription for aerobic and resistive training based on initial evaluation findings, risk stratification, comorbidities and participant's personal goals.       Expected Outcomes Short Term: Increase workloads from initial exercise prescription for resistance, speed,  and METs.;Short Term: Perform resistance training exercises routinely during rehab and add in resistance training at home;Long Term: Improve cardiorespiratory fitness, muscular endurance and strength as measured by increased METs and functional capacity ( )       Able to understand and use rate of perceived exertion (RPE) scale Yes       Intervention Provide education and explanation on how to use RPE scale       Expected Outcomes Short Term: Able to use RPE daily in rehab to express subjective intensity level;Long Term:  Able to use RPE to guide intensity level when exercising independently       Able to understand and use Dyspnea scale Yes       Intervention Provide education and explanation on how to use Dyspnea scale       Expected Outcomes Short Term: Able to use Dyspnea scale daily in rehab to express subjective sense of shortness of breath during exertion;Long Term: Able to use Dyspnea  scale to guide intensity level when exercising independently       Knowledge and understanding of Target Heart Rate Range (THRR) Yes       Intervention Provide education and explanation of THRR including how the numbers were predicted and where they are located for reference       Expected Outcomes Long Term: Able to use THRR to govern intensity when exercising independently;Short Term: Able to use daily as guideline for intensity in rehab;Short Term: Able to state/look up THRR       Able to check pulse independently Yes       Intervention Provide education and demonstration on how to check pulse in carotid and radial arteries.;Review the importance of being able to check your own pulse for safety during independent exercise       Expected Outcomes Short Term: Able to explain why pulse checking is important during independent exercise;Long Term: Able to check pulse independently and accurately       Understanding of Exercise Prescription Yes       Intervention Provide education, explanation, and written  materials on patient's individual exercise prescription       Expected Outcomes Short Term: Able to explain program exercise prescription;Long Term: Able to explain home exercise prescription to exercise independently

## 2024-06-11 NOTE — Progress Notes (Signed)
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 maintaining appropriate intake of nutrient and fiber, sodium and fats, and appropriate energy expenditure required for the weight goal.;Weight Management: Provide education and appropriate resources to help participant work on and attain dietary goals.;Weight Management/Obesity: Establish reasonable short term and long term weight goals.    Admit Weight 262 lb 1.6 oz (118.9 kg)    Goal Weight: Short Term 246 lb (111.6 kg)    Goal Weight: Long Term 230 lb (104.3 kg)    Expected Outcomes Short Term: Continue to assess and modify interventions until short term weight is achieved;Long Term: Adherence to nutrition and physical activity/exercise program aimed toward attainment of established weight goal;Understanding recommendations for meals to include 15-35% energy as protein, 25-35% energy from fat, 35-60% energy from carbohydrates, less than 200mg  of dietary cholesterol,  20-35 gm of total fiber daily;Understanding of distribution of calorie intake throughout the day with the consumption of 4-5 meals/snacks;Weight Loss: Understanding of general recommendations for a balanced deficit meal plan, which promotes 1-2 lb weight loss per week and includes a negative energy balance of [phone] kcal/d    Hypertension Yes    Intervention Provide education on lifestyle modifcations including regular physical activity/exercise, weight management, moderate sodium restriction and increased consumption of fresh fruit, vegetables, and low fat dairy, alcohol moderation, and smoking cessation.;Monitor prescription use compliance.    Expected Outcomes Long Term: Maintenance of blood pressure at goal levels.;Short Term: Continued assessment and intervention until BP is < 140/50mm HG in hypertensive participants. < 130/51mm HG in hypertensive participants with diabetes, heart failure or chronic kidney disease.          Education:Diabetes - Individual verbal and written instruction to review signs/symptoms of diabetes, desired ranges of glucose level fasting, after meals and with exercise. Acknowledge that pre and post exercise glucose checks will be done for 3 sessions at entry of program.   Core Components/Risk Factors/Patient Goals Review:    Core Components/Risk Factors/Patient Goals at Discharge (Final Review):    ITP Comments:  ITP Comments     Row Name 06/06/24 1033 06/11/24 1209         ITP Comments Initial phone call completed. Diagnosis  can be found in [facility] 8/15. EP Orientation scheduled for Monday 9/15 at 10am. Completed and gym orientation for cardiac rehab. Initial ITP created and sent for review to Dr. Oneil Pinal, Medical Director.         Comments: Initial ITP

## 2024-06-14 ENCOUNTER — Telehealth: Payer: Self-pay | Admitting: Family Medicine

## 2024-06-14 NOTE — Telephone Encounter (Addendum)
 The fax from 05/11/24 is different from this request. The request from 05/11/24 was for [facility] paperwork and this request is for short term disability.   Called Montavius Subramaniam (pt's mother) @ [phone] and advised her that we have not received the fax from [facility] for the short term disability and asked if she could please pass a message to her because Deane is not on the [facility] and I cannot disclose any info to her. Glenys stated that she will get in contact with [facility] and send us  another fax with the info for the short term disability.

## 2024-06-14 NOTE — Telephone Encounter (Signed)
 We do not recall seeing this fax, do you have this?

## 2024-06-14 NOTE — Telephone Encounter (Signed)
 Copied from CRM [id number]. Topic: General - Other >> Jun 04, 2024  1:57 PM Donna BRAVO wrote: Reason for CRM: Joshua Bartlett (Self) Phone [phone]  fax [phone]  [facility]  [employer] faxed a form on 09-03/25 regarding short term disability and they require Attending physician Statement. This is separate from the [facility] paperwork that was faxed in on 05/17/24   Joshua Bartlett would like a call back regarding Attending physician Statement.Phone [phone]

## 2024-06-14 NOTE — Telephone Encounter (Signed)
 I completed it like a month ago - I see a phone note from 8/15 saying it was faxed

## 2024-06-19 ENCOUNTER — Encounter

## 2024-06-19 DIAGNOSIS — I213 ST elevation (STEMI) myocardial infarction of unspecified site: Secondary | ICD-10-CM

## 2024-06-19 DIAGNOSIS — I519 Heart disease, unspecified: Secondary | ICD-10-CM | POA: Diagnosis not present

## 2024-06-19 DIAGNOSIS — Z955 Presence of coronary angioplasty implant and graft: Secondary | ICD-10-CM | POA: Diagnosis not present

## 2024-06-19 DIAGNOSIS — I2102 ST elevation (STEMI) myocardial infarction involving left anterior descending coronary artery: Secondary | ICD-10-CM | POA: Diagnosis not present

## 2024-06-19 NOTE — Progress Notes (Signed)
 Assessment start time: 8:32 AM  Digestive issues/concerns: no known food allergies   24-hours Recall: B: granola bar OR [place] sausage hashbrown OR banana OR muffin L: was eat [employer] tuna sub D: salmon. veggies  Beverages juice, water  (32oz), coffee,   Education r/t nutrition plan Patient drinking ~32oz of water , set goal to get 48-64oz consistently. He eats 3 meals per day. Reviewed mediterranean diet handout. Educated on types of fats, sources, and how to read facts labels. Provided guideline limits of less than 12g saturated fat and less than 1500mg  sodium. Brainstormed several small meals and snacks with foods he likes and will eat focusing on less sodium saturated fat and more colorful produce.   Goal 1: Read labels and reduce sodium intake to below 2300mg . Ideally 1500mg  per day.  Goal 2: Reduce saturated fat, less than 12g per day. Replace bad fats for more heart healthy fats.  Goal 3: Include more colorful produce, aim for 5-8 servings of fruits and veggies per day  End time 9:31 AM

## 2024-06-19 NOTE — Progress Notes (Signed)
 Daily Session Note  Patient Details  Name: Joshua Bartlett MRN: [id number] Date of Birth: 1981-06-18 Referring Provider:   Flowsheet Row Cardiac Rehab from 06/11/2024 in [facility] Cardiac [facility]  Referring Provider Leita Allean Qualia, MD    Encounter Date: 06/19/2024  Check In:  Session Check In - 06/19/24 0858       Check-In   Supervising physician immediately available to respond to emergencies See telemetry face sheet for immediately available ER MD    Location [facility]-Cardiac & Pulmonary Rehab    Staff Present Burnard Davenport [facility], RN, DNP, NE-BC;Maxon Burnell BS, Exercise Physiologist;Margaret Best, MS, Exercise Physiologist;Jason Elnor RDN,LDN    Virtual Visit No    Medication changes reported     No    Fall or balance concerns reported    No    Warm-up and Cool-down Performed on first and last piece of equipment    Resistance Training Performed Yes    VAD Patient? No    PAD/SET Patient? No      Pain Assessment   Currently in Pain? No/denies             Social History   Tobacco Use  Smoking Status Former   Current packs/day: 0.30   Types: Cigarettes   Passive exposure: Past  Smokeless Tobacco Never  Tobacco Comments   started smoking at age 5    Goals Met:  Independence with exercise equipment Exercise tolerated well No report of concerns or symptoms today Strength training completed today  Goals Unmet:  Not Applicable  Comments: First full day of exercise!  Patient was oriented to gym and equipment including functions, settings, policies, and procedures.  Patient's individual exercise prescription and treatment plan were reviewed.  All starting workloads were established based on the results of the 6 minute walk test done at initial orientation visit.  The plan for exercise progression was also introduced and progression will be customized based on patient's performance and goals.    Dr. Oneil Pinal is Medical Director for  [facility] Cardiac Rehabilitation.  Dr. Fuad Aleskerov is Medical Director for [facility] Pulmonary Rehabilitation.

## 2024-06-20 DIAGNOSIS — Z955 Presence of coronary angioplasty implant and graft: Secondary | ICD-10-CM

## 2024-06-20 DIAGNOSIS — I213 ST elevation (STEMI) myocardial infarction of unspecified site: Secondary | ICD-10-CM

## 2024-06-20 NOTE — Progress Notes (Signed)
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 in [facility] Cardiac and Pulmonary Rehab  Education need identified 06/11/24    Medication Safety: - Group verbal and visual instruction to review commonly prescribed medications for heart and lung disease. Reviews the medication, class of the drug, and side effects. Includes the steps to properly store meds and maintain the prescription regimen. Written material provided at class time.   Intimacy: - Group verbal instruction through game format to discuss how heart and lung disease can affect sexual intimacy. Written material provided at class time.   Know Your Numbers and Heart Failure: - Group verbal and visual instruction to discuss disease risk factors for cardiac and pulmonary disease and treatment options.  Reviews associated critical values for Overweight/Obesity, Hypertension, Cholesterol, and Diabetes.  Discusses basics of heart failure: signs/symptoms and  treatments.  Introduces Heart Failure Zone chart for action plan for heart failure. Written material provided at class time. Flowsheet Row Cardiac Rehab from 06/11/2024 in [facility] Cardiac and Pulmonary Rehab  Education need identified 06/11/24    Infection Prevention: - Provides verbal and written material to individual with discussion of infection control including proper hand washing and proper equipment cleaning during exercise session. Flowsheet Row Cardiac Rehab from 06/11/2024 in [facility] Cardiac and Pulmonary Rehab  Date 06/11/24  Educator MB  Instruction Review Code 1- Verbalizes Understanding    Falls Prevention: - Provides verbal and written material to individual with discussion of falls prevention and safety. Flowsheet Row Cardiac Rehab from 06/11/2024 in [facility] Cardiac and Pulmonary Rehab  Date 06/11/24  Educator MB  Instruction Review Code 1- Verbalizes Understanding    Other: -Provides group and verbal instruction on various topics (see comments)   Knowledge Questionnaire Score:  Knowledge Questionnaire Score - 06/11/24 1312  Knowledge Questionnaire Score   Pre Score 21/26          Core Components/Risk Factors/Patient Goals at Admission:  Personal Goals and Risk Factors at Admission - 06/11/24 1312       Core Components/Risk Factors/Patient Goals on Admission    Weight Management Yes;Weight Loss    Intervention Weight Management: Develop a combined nutrition and exercise program designed to reach desired caloric intake, while maintaining appropriate intake of nutrient and fiber, sodium and fats, and appropriate energy expenditure required for the weight goal.;Weight Management: Provide education and appropriate resources to help participant work on and attain dietary goals.;Weight Management/Obesity: Establish reasonable short term and long term weight goals.    Admit Weight 262 lb 1.6 oz (118.9 kg)    Goal Weight: Short Term 246 lb (111.6 kg)    Goal Weight: Long  Term 230 lb (104.3 kg)    Expected Outcomes Short Term: Continue to assess and modify interventions until short term weight is achieved;Long Term: Adherence to nutrition and physical activity/exercise program aimed toward attainment of established weight goal;Understanding recommendations for meals to include 15-35% energy as protein, 25-35% energy from fat, 35-60% energy from carbohydrates, less than 200mg  of dietary cholesterol, 20-35 gm of total fiber daily;Understanding of distribution of calorie intake throughout the day with the consumption of 4-5 meals/snacks;Weight Loss: Understanding of general recommendations for a balanced deficit meal plan, which promotes 1-2 lb weight loss per week and includes a negative energy balance of [phone] kcal/d    Hypertension Yes    Intervention Provide education on lifestyle modifcations including regular physical activity/exercise, weight management, moderate sodium restriction and increased consumption of fresh fruit, vegetables, and low fat dairy, alcohol moderation, and smoking cessation.;Monitor prescription use compliance.    Expected Outcomes Long Term: Maintenance of blood pressure at goal levels.;Short Term: Continued assessment and intervention until BP is < 140/58mm HG in hypertensive participants. < 130/70mm HG in hypertensive participants with diabetes, heart failure or chronic kidney disease.          Education:Diabetes - Individual verbal and written instruction to review signs/symptoms of diabetes, desired ranges of glucose level fasting, after meals and with exercise. Acknowledge that pre and post exercise glucose checks will be done for 3 sessions at entry of program.   Core Components/Risk Factors/Patient Goals Review:    Core Components/Risk Factors/Patient Goals at Discharge (Final Review):    ITP Comments:  ITP Comments     Row Name 06/06/24 1033 06/11/24 1209 06/19/24 0859 06/20/24 1004     ITP Comments Initial phone call  completed. Diagnosis can be found in [facility] 8/15. EP Orientation scheduled for Monday 9/15 at 10am. Completed and gym orientation for cardiac rehab. Initial ITP created and sent for review to Dr. Oneil Pinal, Medical Director. First full day of exercise!  Patient was oriented to gym and equipment including functions, settings, policies, and procedures.  Patient's individual exercise prescription and treatment plan were reviewed.  All starting workloads were established based on the results of the 6 minute walk test done at initial orientation visit.  The plan for exercise progression was also introduced and progression will be customized based on patient's performance and goals. 30 Day review completed. Medical Director ITP review done, changes made as directed, and signed approval by Medical Director. New to program.       Comments: 30 day review

## 2024-06-21 ENCOUNTER — Encounter

## 2024-06-26 ENCOUNTER — Encounter

## 2024-06-28 ENCOUNTER — Encounter: Attending: Cardiology | Admitting: Emergency Medicine

## 2024-06-28 DIAGNOSIS — I213 ST elevation (STEMI) myocardial infarction of unspecified site: Secondary | ICD-10-CM | POA: Diagnosis not present

## 2024-06-28 DIAGNOSIS — Z955 Presence of coronary angioplasty implant and graft: Secondary | ICD-10-CM | POA: Insufficient documentation

## 2024-06-28 NOTE — Progress Notes (Signed)
 Daily Session Note  Patient Details  Name: Joshua Bartlett MRN: [id number] Date of Birth: 1981/09/18 Referring Provider:   Flowsheet Row Cardiac Rehab from 06/11/2024 in [facility] Cardiac and Pulmonary Rehab  Referring Provider Leita Allean Qualia, MD    Encounter Date: 06/28/2024  Check In:  Session Check In - 06/28/24 0940       Check-In   Supervising physician immediately available to respond to emergencies See telemetry face sheet for immediately available ER MD    Location [facility]-Cardiac & Pulmonary Rehab    Staff Present Rollene Paterson, MS, Exercise Physiologist;Tessie Ordaz Vita RN,BSN;Joseph [facility] BS, Exercise Physiologist    Virtual Visit No    Medication changes reported     No    Fall or balance concerns reported    No    Warm-up and Cool-down Performed on first and last piece of equipment    Resistance Training Performed Yes    VAD Patient? No    PAD/SET Patient? No      Pain Assessment   Currently in Pain? No/denies             Social History   Tobacco Use  Smoking Status Former   Current packs/day: 0.30   Types: Cigarettes   Passive exposure: Past  Smokeless Tobacco Never  Tobacco Comments   started smoking at age 3    Goals Met:  Independence with exercise equipment Exercise tolerated well No report of concerns or symptoms today Strength training completed today  Goals Unmet:  Not Applicable  Comments: Pt able to follow exercise prescription today without complaint.  Will continue to monitor for progression.    Dr. Oneil Pinal is Medical Director for [facility] Cardiac Rehabilitation.  Dr. Fuad Aleskerov is Medical Director for [facility] Pulmonary Rehabilitation.

## 2024-07-03 ENCOUNTER — Encounter

## 2024-07-03 DIAGNOSIS — I213 ST elevation (STEMI) myocardial infarction of unspecified site: Secondary | ICD-10-CM

## 2024-07-03 DIAGNOSIS — Z955 Presence of coronary angioplasty implant and graft: Secondary | ICD-10-CM

## 2024-07-03 NOTE — Progress Notes (Signed)
 Daily Session Note  Patient Details  Name: TOBEN ACUNA MRN: [id number] Date of Birth: [birth year] Referring Provider:   Flowsheet Row Cardiac Rehab from 06/11/2024 in [facility] Cardiac and Pulmonary Rehab  Referring Provider Leita Allean Qualia, MD    Encounter Date: 07/03/2024  Check In:  Session Check In - 07/03/24 0920       Check-In   Supervising physician immediately available to respond to emergencies See telemetry face sheet for immediately available ER MD    Location [facility]-Cardiac & Pulmonary Rehab    Staff Present Burnard Davenport RN,BSN,MPA;Maxon Conetta BS, Exercise Physiologist;Margaret Best, MS, Exercise Physiologist;Jason Elnor RDN,LDN    Virtual Visit No    Medication changes reported     No    Fall or balance concerns reported    No    Warm-up and Cool-down Performed on first and last piece of equipment    Resistance Training Performed Yes    VAD Patient? No    PAD/SET Patient? No      Pain Assessment   Currently in Pain? No/denies             Social History   Tobacco Use  Smoking Status Former   Current packs/day: 0.30   Types: Cigarettes   Passive exposure: Past  Smokeless Tobacco Never  Tobacco Comments   started smoking at age 49    Goals Met:  Independence with exercise equipment Exercise tolerated well No report of concerns or symptoms today Strength training completed today  Goals Unmet:  Not Applicable  Comments: Pt able to follow exercise prescription today without complaint.  Will continue to monitor for progression.    Dr. Oneil Pinal is Medical Director for [facility] Cardiac Rehabilitation.  Dr. Fuad Aleskerov is Medical Director for [facility] Pulmonary Rehabilitation.

## 2024-07-05 ENCOUNTER — Encounter: Admitting: Emergency Medicine

## 2024-07-05 DIAGNOSIS — I213 ST elevation (STEMI) myocardial infarction of unspecified site: Secondary | ICD-10-CM | POA: Diagnosis not present

## 2024-07-05 DIAGNOSIS — Z955 Presence of coronary angioplasty implant and graft: Secondary | ICD-10-CM

## 2024-07-05 NOTE — Progress Notes (Signed)
 Daily Session Note  Patient Details  Name: Joshua Bartlett MRN: [id number] Date of Birth: 08/21/1981 Referring Provider:   Flowsheet Row Cardiac Rehab from 06/11/2024 in [facility] Cardiac and Pulmonary Rehab  Referring Provider Leita Allean Qualia, MD    Encounter Date: 07/05/2024  Check In:  Session Check In - 07/05/24 0940       Check-In   Supervising physician immediately available to respond to emergencies See telemetry face sheet for immediately available ER MD    Location [facility]-Cardiac & Pulmonary Rehab    Staff Present Fairy Plater RCP,RRT,BSRT;Maxon Conetta BS, Exercise Physiologist;Kaine Mcquillen RN,BSN;Margaret Best, MS, Exercise Physiologist;Jason Elnor RDN,LDN    Virtual Visit No    Medication changes reported     No    Fall or balance concerns reported    No    Warm-up and Cool-down Performed on first and last piece of equipment    Resistance Training Performed Yes    VAD Patient? No    PAD/SET Patient? No      Pain Assessment   Currently in Pain? No/denies             Social History   Tobacco Use  Smoking Status Former   Current packs/day: 0.30   Types: Cigarettes   Passive exposure: Past  Smokeless Tobacco Never  Tobacco Comments   started smoking at age 86    Goals Met:  Independence with exercise equipment Exercise tolerated well No report of concerns or symptoms today Strength training completed today  Goals Unmet:  Not Applicable  Comments: Pt able to follow exercise prescription today without complaint.  Will continue to monitor for progression.    Dr. Oneil Pinal is Medical Director for [facility] Cardiac Rehabilitation.  Dr. Fuad Aleskerov is Medical Director for [facility] Pulmonary Rehabilitation.

## 2024-07-10 ENCOUNTER — Encounter

## 2024-07-10 DIAGNOSIS — Z955 Presence of coronary angioplasty implant and graft: Secondary | ICD-10-CM | POA: Diagnosis not present

## 2024-07-10 DIAGNOSIS — I213 ST elevation (STEMI) myocardial infarction of unspecified site: Secondary | ICD-10-CM | POA: Diagnosis not present

## 2024-07-10 NOTE — Progress Notes (Signed)
 Daily Session Note  Patient Details  Name: Joshua Bartlett MRN: [id number] Date of Birth: 04-Jan-1981 Referring Provider:   Flowsheet Row Cardiac Rehab from 06/11/2024 in [facility] Cardiac and Pulmonary Rehab  Referring Provider Leita Allean Qualia, MD    Encounter Date: 07/10/2024  Check In:  Session Check In - 07/10/24 0920       Check-In   Supervising physician immediately available to respond to emergencies See telemetry face sheet for immediately available ER MD    Location [facility]-Cardiac & Pulmonary Rehab    Staff Present Burnard Davenport RN,BSN,MPA;Maxon Conetta BS, Exercise Physiologist;Margaret Best, MS, Exercise Physiologist;Jason Elnor RDN,LDN    Virtual Visit No    Medication changes reported     No    Fall or balance concerns reported    No    Warm-up and Cool-down Performed on first and last piece of equipment    Resistance Training Performed Yes    VAD Patient? No    PAD/SET Patient? No      Pain Assessment   Currently in Pain? No/denies             Social History   Tobacco Use  Smoking Status Former   Current packs/day: 0.30   Types: Cigarettes   Passive exposure: Past  Smokeless Tobacco Never  Tobacco Comments   started smoking at age 43    Goals Met:  Independence with exercise equipment Exercise tolerated well No report of concerns or symptoms today Strength training completed today  Goals Unmet:  Not Applicable  Comments: Pt able to follow exercise prescription today without complaint.  Will continue to monitor for progression.    Dr. Oneil Pinal is Medical Director for [facility] Cardiac Rehabilitation.  Dr. Fuad Aleskerov is Medical Director for [facility] Pulmonary Rehabilitation.

## 2024-07-12 ENCOUNTER — Encounter: Admitting: *Deleted

## 2024-07-12 DIAGNOSIS — I213 ST elevation (STEMI) myocardial infarction of unspecified site: Secondary | ICD-10-CM | POA: Diagnosis not present

## 2024-07-12 DIAGNOSIS — Z955 Presence of coronary angioplasty implant and graft: Secondary | ICD-10-CM | POA: Diagnosis not present

## 2024-07-12 NOTE — Progress Notes (Signed)
 Daily Session Note  Patient Details  Name: Joshua Bartlett MRN: [id number] Date of Birth: Jul 30, 1981 Referring Provider:   Flowsheet Row Cardiac Rehab from 06/11/2024 in [facility] Cardiac and Pulmonary Rehab  Referring Provider Leita Allean Qualia, MD    Encounter Date: 07/12/2024  Check In:  Session Check In - 07/12/24 1718       Check-In   Supervising physician immediately available to respond to emergencies See telemetry face sheet for immediately available ER MD    Location [facility]-Cardiac & Pulmonary Rehab    Staff Present Hoy Rodney RN,BSN;Joseph [facility] BS, Exercise Physiologist    Virtual Visit No    Medication changes reported     No    Fall or balance concerns reported    No    Warm-up and Cool-down Performed on first and last piece of equipment    Resistance Training Performed Yes    VAD Patient? No    PAD/SET Patient? No      Pain Assessment   Currently in Pain? No/denies             Social History   Tobacco Use  Smoking Status Former   Current packs/day: 0.30   Types: Cigarettes   Passive exposure: Past  Smokeless Tobacco Never  Tobacco Comments   started smoking at age 20    Goals Met:  Independence with exercise equipment Exercise tolerated well No report of concerns or symptoms today Strength training completed today  Goals Unmet:  Not Applicable  Comments: Pt able to follow exercise prescription today without complaint.  Will continue to monitor for progression.    Dr. Oneil Pinal is Medical Director for [facility] Cardiac Rehabilitation.  Dr. Fuad Aleskerov is Medical Director for [facility] Pulmonary Rehabilitation.

## 2024-07-16 DIAGNOSIS — I2102 ST elevation (STEMI) myocardial infarction involving left anterior descending coronary artery: Secondary | ICD-10-CM | POA: Diagnosis not present

## 2024-07-17 ENCOUNTER — Encounter

## 2024-07-18 DIAGNOSIS — Z955 Presence of coronary angioplasty implant and graft: Secondary | ICD-10-CM

## 2024-07-18 DIAGNOSIS — I213 ST elevation (STEMI) myocardial infarction of unspecified site: Secondary | ICD-10-CM

## 2024-07-18 NOTE — Progress Notes (Signed)
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 exercise prescription    Comments Mr. Sesay is coming to cardiac rehab after a STEMI w/ stent. He has a history of ankylosing spondylitis and states the best thing for him is to move, so this program will help. He used to do a lot of weight training in the past but stopped when he was diagnosed with AS but is looking forward to getting into a regular exercise routine again. He was discharged with a life vest which has made sleep even more difficult. he has a long history of bad sleep, has tried different medications with no real help, and is going to try magnesium  that his doctor recently prescribed. 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    Expected Outcomes Short: attend cardiac rehab for education and exercise Long: develop and maintain positive self care habits    Continue Psychosocial Services  Follow up  required by staff          Psychosocial Re-Evaluation:   Psychosocial Discharge (Final Psychosocial Re-Evaluation):   Vocational Rehabilitation: Provide vocational rehab assistance to qualifying candidates.   Vocational Rehab Evaluation & Intervention:  Vocational Rehab - 06/06/24 1015  Initial Vocational Rehab Evaluation & Intervention   Assessment shows need for Vocational Rehabilitation No          Education: Education Goals: Education classes will be provided on a variety of topics geared toward better understanding of heart health and risk factor modification. Participant will state understanding/return demonstration of topics presented as noted by education test scores.  Learning Barriers/Preferences:  Learning Barriers/Preferences - 06/06/24 1013       Learning Barriers/Preferences   Learning Barriers None    Learning Preferences None          General Cardiac Education Topics:  AED/CPR: - Group verbal and written instruction with the use of models to demonstrate the basic use of the AED with the basic ABC's of resuscitation.   Test and Procedures: - Group verbal and visual presentation and models provide information about basic cardiac anatomy and function. Reviews the testing methods done to diagnose heart disease and the outcomes of the test results. Describes the treatment choices: Medical Management, Angioplasty, or Coronary Bypass Surgery for treating various heart conditions including Myocardial Infarction, Angina, Valve Disease, and Cardiac Arrhythmias. Written material provided at class time. Flowsheet Row Cardiac Rehab from 06/11/2024 in [facility] Cardiac and Pulmonary Rehab  Education need identified 06/11/24    Medication Safety: - Group verbal and visual instruction to review commonly prescribed medications for heart and lung disease. Reviews the medication, class of the drug, and side effects. Includes the steps to properly store meds and  maintain the prescription regimen. Written material provided at class time.   Intimacy: - Group verbal instruction through game format to discuss how heart and lung disease can affect sexual intimacy. Written material provided at class time.   Know Your Numbers and Heart Failure: - Group verbal and visual instruction to discuss disease risk factors for cardiac and pulmonary disease and treatment options.  Reviews associated critical values for Overweight/Obesity, Hypertension, Cholesterol, and Diabetes.  Discusses basics of heart failure: signs/symptoms and treatments.  Introduces Heart Failure Zone chart for action plan for heart failure. Written material provided at class time. Flowsheet Row Cardiac Rehab from 06/11/2024 in [facility] Cardiac and Pulmonary Rehab  Education need identified 06/11/24    Infection Prevention: - Provides verbal and written material to individual with discussion of infection control including proper hand washing and proper equipment cleaning during exercise session. Flowsheet Row Cardiac Rehab from 06/11/2024 in [facility] Cardiac and Pulmonary Rehab  Date 06/11/24  Educator MB  Instruction Review Code 1- Verbalizes Understanding    Falls Prevention: - Provides verbal and written material to individual with discussion of falls prevention and safety. Flowsheet Row Cardiac Rehab from 06/11/2024 in [facility] Cardiac and Pulmonary Rehab  Date 06/11/24  Educator MB  Instruction Review Code 1- Verbalizes Understanding    Other: -Provides group and verbal instruction on various topics (see comments)   Knowledge Questionnaire Score:  Knowledge Questionnaire Score - 06/11/24 1312       Knowledge Questionnaire Score   Pre Score 21/26          Core Components/Risk Factors/Patient Goals at Admission:  Personal Goals and Risk Factors at Admission - 06/11/24 1312       Core Components/Risk Factors/Patient Goals on Admission    Weight Management Yes;Weight Loss     Intervention Weight Management: Develop a combined nutrition and exercise program designed to reach desired caloric intake, while maintaining appropriate intake of nutrient and fiber, sodium and fats, and appropriate energy expenditure required for the weight goal.;Weight Management: Provide  education and appropriate resources to help participant work on and attain dietary goals.;Weight Management/Obesity: Establish reasonable short term and long term weight goals.    Admit Weight 262 lb 1.6 oz (118.9 kg)    Goal Weight: Short Term 246 lb (111.6 kg)    Goal Weight: Long Term 230 lb (104.3 kg)    Expected Outcomes Short Term: Continue to assess and modify interventions until short term weight is achieved;Long Term: Adherence to nutrition and physical activity/exercise program aimed toward attainment of established weight goal;Understanding recommendations for meals to include 15-35% energy as protein, 25-35% energy from fat, 35-60% energy from carbohydrates, less than 200mg  of dietary cholesterol, 20-35 gm of total fiber daily;Understanding of distribution of calorie intake throughout the day with the consumption of 4-5 meals/snacks;Weight Loss: Understanding of general recommendations for a balanced deficit meal plan, which promotes 1-2 lb weight loss per week and includes a negative energy balance of [phone] kcal/d    Hypertension Yes    Intervention Provide education on lifestyle modifcations including regular physical activity/exercise, weight management, moderate sodium restriction and increased consumption of fresh fruit, vegetables, and low fat dairy, alcohol moderation, and smoking cessation.;Monitor prescription use compliance.    Expected Outcomes Long Term: Maintenance of blood pressure at goal levels.;Short Term: Continued assessment and intervention until BP is < 140/36mm HG in hypertensive participants. < 130/36mm HG in hypertensive participants with diabetes, heart failure or chronic kidney  disease.          Education:Diabetes - Individual verbal and written instruction to review signs/symptoms of diabetes, desired ranges of glucose level fasting, after meals and with exercise. Acknowledge that pre and post exercise glucose checks will be done for 3 sessions at entry of program.   Core Components/Risk Factors/Patient Goals Review:    Core Components/Risk Factors/Patient Goals at Discharge (Final Review):    ITP Comments:  ITP Comments     Row Name 06/06/24 1033 06/11/24 1209 06/19/24 0859 06/20/24 1004 07/18/24 0837   ITP Comments Initial phone call completed. Diagnosis can be found in [facility] 8/15. EP Orientation scheduled for Monday 9/15 at 10am. Completed and gym orientation for cardiac rehab. Initial ITP created and sent for review to Dr. Oneil Pinal, Medical Director. First full day of exercise!  Patient was oriented to gym and equipment including functions, settings, policies, and procedures.  Patient's individual exercise prescription and treatment plan were reviewed.  All starting workloads were established based on the results of the 6 minute walk test done at initial orientation visit.  The plan for exercise progression was also introduced and progression will be customized based on patient's performance and goals. 30 Day review completed. Medical Director ITP review done, changes made as directed, and signed approval by Medical Director. New to program. 30 Day review completed. Medical Director ITP review done, changes made as directed, and signed approval by Medical Director.      Comments: 30 day review

## 2024-07-19 ENCOUNTER — Encounter

## 2024-07-19 DIAGNOSIS — I5022 Chronic systolic (congestive) heart failure: Secondary | ICD-10-CM | POA: Diagnosis not present

## 2024-07-19 DIAGNOSIS — I2102 ST elevation (STEMI) myocardial infarction involving left anterior descending coronary artery: Secondary | ICD-10-CM | POA: Diagnosis not present

## 2024-07-19 DIAGNOSIS — I255 Ischemic cardiomyopathy: Secondary | ICD-10-CM | POA: Diagnosis not present

## 2024-07-24 ENCOUNTER — Encounter

## 2024-07-26 ENCOUNTER — Encounter: Admitting: Emergency Medicine

## 2024-07-26 DIAGNOSIS — I472 Ventricular tachycardia, unspecified: Secondary | ICD-10-CM | POA: Diagnosis not present

## 2024-07-26 DIAGNOSIS — I213 ST elevation (STEMI) myocardial infarction of unspecified site: Secondary | ICD-10-CM

## 2024-07-26 DIAGNOSIS — I2510 Atherosclerotic heart disease of native coronary artery without angina pectoris: Secondary | ICD-10-CM | POA: Diagnosis not present

## 2024-07-26 DIAGNOSIS — Z955 Presence of coronary angioplasty implant and graft: Secondary | ICD-10-CM | POA: Diagnosis not present

## 2024-07-26 DIAGNOSIS — Z79899 Other long term (current) drug therapy: Secondary | ICD-10-CM | POA: Diagnosis not present

## 2024-07-26 DIAGNOSIS — I519 Heart disease, unspecified: Secondary | ICD-10-CM | POA: Diagnosis not present

## 2024-07-26 DIAGNOSIS — I5020 Unspecified systolic (congestive) heart failure: Secondary | ICD-10-CM | POA: Diagnosis not present

## 2024-07-26 NOTE — Progress Notes (Signed)
 Daily Session Note  Patient Details  Name: Joshua Bartlett MRN: [id number] Date of Birth: 1981-05-22 Referring Provider:   Flowsheet Row Cardiac Rehab from 06/11/2024 in [facility] Cardiac and Pulmonary Rehab  Referring Provider Leita Allean Qualia, MD    Encounter Date: 07/26/2024  Check In:  Session Check In - 07/26/24 9078       Check-In   Supervising physician immediately available to respond to emergencies See telemetry face sheet for immediately available ER MD    Location [facility]-Cardiac & Pulmonary Rehab    Staff Present Leita Franks RN,BSN;[facility] RCP,RRT,BSRT;Margaret Best, MS, Exercise Physiologist    Virtual Visit No    Medication changes reported     Yes    Comments started lasix    Fall or balance concerns reported    No    Warm-up and Cool-down Performed on first and last piece of equipment    Resistance Training Performed Yes    VAD Patient? No    PAD/SET Patient? No      Pain Assessment   Currently in Pain? No/denies             Social History   Tobacco Use  Smoking Status Former   Current packs/day: 0.30   Types: Cigarettes   Passive exposure: Past  Smokeless Tobacco Never  Tobacco Comments   started smoking at age 50    Goals Met:  Independence with exercise equipment Exercise tolerated well No report of concerns or symptoms today Strength training completed today  Goals Unmet:  Not Applicable  Comments: Pt able to follow exercise prescription today without complaint.  Will continue to monitor for progression.    Dr. Oneil Pinal is Medical Director for [facility] Cardiac Rehabilitation.  Dr. Fuad Aleskerov is Medical Director for [facility] Pulmonary Rehabilitation.

## 2024-07-31 ENCOUNTER — Encounter

## 2024-08-02 ENCOUNTER — Encounter: Attending: Cardiology

## 2024-08-02 DIAGNOSIS — I213 ST elevation (STEMI) myocardial infarction of unspecified site: Secondary | ICD-10-CM | POA: Insufficient documentation

## 2024-08-02 DIAGNOSIS — Z955 Presence of coronary angioplasty implant and graft: Secondary | ICD-10-CM | POA: Insufficient documentation

## 2024-08-07 ENCOUNTER — Encounter

## 2024-08-09 ENCOUNTER — Telehealth: Payer: Self-pay

## 2024-08-09 ENCOUNTER — Encounter

## 2024-08-09 NOTE — Telephone Encounter (Signed)
 We called Pt today as he has not attended rehab since 07/26/2024. There was no answer. We left a message requesting that he call us  back to update us  on his status in the program.

## 2024-08-09 NOTE — Telephone Encounter (Signed)
 Pt called us  back to inform us  that he has been having some dental issues which is why he has been unable to attend rehab recently. He will continue to keep us  updated with his status in the program.

## 2024-08-14 ENCOUNTER — Encounter

## 2024-08-15 ENCOUNTER — Encounter: Payer: Self-pay | Admitting: *Deleted

## 2024-08-15 DIAGNOSIS — Z955 Presence of coronary angioplasty implant and graft: Secondary | ICD-10-CM

## 2024-08-15 DIAGNOSIS — I213 ST elevation (STEMI) myocardial infarction of unspecified site: Secondary | ICD-10-CM

## 2024-08-15 NOTE — Progress Notes (Signed)
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   Health/Function Pre 15.77 %    Socioeconomic Pre 24.21 %    Psych/Spiritual Pre 14.57 %    Family Pre 21 %    GLOBAL Pre 17.94 %         Scores of 19 and below usually indicate a poorer quality of life in these areas.  A difference of  2-3 points is a clinically meaningful difference.  A difference of 2-3 points in the total score of the Quality of Life Index has been associated with significant improvement in overall quality of life, self-image, physical symptoms, and general health in studies assessing change in quality of life.  PHQ-9: Review Flowsheet  More data exists      06/11/2024 08/02/2023 07/05/2023 02/16/2023 07/23/2021  Depression screen PHQ 2/9  Decreased Interest 3 0 1 1 0  Down, Depressed, Hopeless 2 0 0 0 0  PHQ - 2 Score 5 0 1 1 0  Altered sleeping 3 2 2  0 1  Tired, decreased energy 3 1 2  0 0  Change in appetite 0 1 0 0 0  Feeling bad or failure about yourself  0 0 0 0 0   Trouble concentrating 1 0 2 1 1   Moving slowly or fidgety/restless 2 0 0 0 0  Suicidal thoughts 0 0 0 0 0  PHQ-9 Score 14  4  7  2  2    Difficult doing work/chores Somewhat difficult - Somewhat difficult Not difficult at all Somewhat difficult    Details       Data saved with a previous flowsheet row definition        Interpretation of Total Score  Total Score Depression Severity:  1-4 = Minimal depression, 5-9 = Mild depression, 10-14 = Moderate depression, 15-19 = Moderately severe depression, 20-27 = Severe depression   Psychosocial Evaluation and Intervention:  Psychosocial Evaluation - 06/06/24 1025       Psychosocial Evaluation & Interventions   Interventions Encouraged  to exercise with the program and follow exercise prescription    Comments Mr. Copeman is coming to cardiac rehab after a STEMI w/ stent. He has a history of ankylosing spondylitis and states the best thing for him is to move, so this program will help. He used to do a lot of weight training in the past but stopped when he was diagnosed with AS but is looking forward to getting into a regular exercise routine again. He was discharged with a life vest which has made sleep even more difficult. he has a long history of bad sleep, has tried different medications with no real help, and is going to try magnesium  that his doctor recently prescribed. 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    Expected Outcomes Short: attend cardiac rehab for education and exercise Long: develop and maintain positive self care habits    Continue Psychosocial Services  Follow up required by staff          Psychosocial Re-Evaluation:   Psychosocial Discharge (Final Psychosocial Re-Evaluation):   Vocational Rehabilitation: Provide vocational rehab assistance to qualifying candidates.   Vocational Rehab Evaluation & Intervention:  Vocational Rehab - 06/06/24 1015       Initial  Vocational Rehab Evaluation & Intervention   Assessment shows need for Vocational Rehabilitation No          Education: Education Goals: Education classes will be provided on a variety of topics geared toward better understanding of heart health and risk factor modification. Participant will state understanding/return demonstration of topics presented as noted by education test scores.  Learning Barriers/Preferences:  Learning Barriers/Preferences - 06/06/24 1013       Learning Barriers/Preferences   Learning Barriers None    Learning Preferences None          General Cardiac Education Topics:  AED/CPR: - Group verbal and written instruction with the use of models to demonstrate the basic use of the AED with the basic ABC's of resuscitation.   Test and Procedures: - Group verbal and visual presentation and models provide information about basic cardiac anatomy and function. Reviews the testing methods done to diagnose heart disease and the outcomes of the test results. Describes the treatment choices: Medical Management, Angioplasty, or Coronary Bypass Surgery for treating various heart conditions including Myocardial Infarction, Angina, Valve Disease, and Cardiac Arrhythmias. Written material provided at class time. Flowsheet Row Cardiac Rehab from 06/11/2024 in [facility] Cardiac [facility] Pul[facility] Re[facility]  Education need identified 06/11/24    Medication Safety: - Group verbal and visual instruction to review commonly prescribed medications for heart and lung disease. Reviews the medication, class of the drug, and side effects. Includes the steps to properly store meds and maintain the prescription regimen. Written material provided at class time.   Intimacy: - Group verbal instruction through game format to discuss how heart and lung disease can affect sexual intimacy. Written material provided at class time.   Know Your Numbers and Heart Failure: - Group verbal and visual  instruction to discuss disease risk factors for cardiac and pulmonary disease and treatment options.  Reviews associated critical values for Overweight/Obesity, Hypertension, Cholesterol, and Diabetes.  Discusses basics of heart failure: signs/symptoms and treatments.  Introduces Heart Failure Zone chart for action plan for heart failure. Written material provided at class time. Flowsheet Row Cardiac Rehab from 06/11/2024 in [facility] Cardiac and Pulmonary Rehab  Education need identified 06/11/24    Infection Prevention: -  Provides verbal and written material to individual with discussion of infection control including proper hand washing and proper equipment cleaning during exercise session. Flowsheet Row Cardiac Rehab from 06/11/2024 in [facility] Cardiac and Pulmonary Rehab  Date 06/11/24  Educator MB  Instruction Review Code 1- Verbalizes Understanding    Falls Prevention: - Provides verbal and written material to individual with discussion of falls prevention and safety. Flowsheet Row Cardiac Rehab from 06/11/2024 in [facility] Cardiac and Pulmonary Rehab  Date 06/11/24  Educator MB  Instruction Review Code 1- Verbalizes Understanding    Other: -Provides group and verbal instruction on various topics (see comments)   Knowledge Questionnaire Score:  Knowledge Questionnaire Score - 06/11/24 1312       Knowledge Questionnaire Score   Pre Score 21/26          Core Components/Risk Factors/Patient Goals at Admission:  Personal Goals and Risk Factors at Admission - 06/11/24 1312       Core Components/Risk Factors/Patient Goals on Admission    Weight Management Yes;Weight Loss    Intervention Weight Management: Develop a combined nutrition and exercise program designed to reach desired caloric intake, while maintaining appropriate intake of nutrient and fiber, sodium and fats, and appropriate energy expenditure required for the weight goal.;Weight Management: Provide education and appropriate  resources to help participant work on and attain dietary goals.;Weight Management/Obesity: Establish reasonable short term and long term weight goals.    Admit Weight 262 lb 1.6 oz (118.9 kg)    Goal Weight: Short Term 246 lb (111.6 kg)    Goal Weight: Long Term 230 lb (104.3 kg)    Expected Outcomes Short Term: Continue to assess and modify interventions until short term weight is achieved;Long Term: Adherence to nutrition and physical activity/exercise program aimed toward attainment of established weight goal;Understanding recommendations for meals to include 15-35% energy as protein, 25-35% energy from fat, 35-60% energy from carbohydrates, less than 200mg  of dietary cholesterol, 20-35 gm of total fiber daily;Understanding of distribution of calorie intake throughout the day with the consumption of 4-5 meals/snacks;Weight Loss: Understanding of general recommendations for a balanced deficit meal plan, which promotes 1-2 lb weight loss per week and includes a negative energy balance of [phone] kcal/d    Hypertension Yes    Intervention Provide education on lifestyle modifcations including regular physical activity/exercise, weight management, moderate sodium restriction and increased consumption of fresh fruit, vegetables, and low fat dairy, alcohol moderation, and smoking cessation.;Monitor prescription use compliance.    Expected Outcomes Long Term: Maintenance of blood pressure at goal levels.;Short Term: Continued assessment and intervention until BP is < 140/27mm HG in hypertensive participants. < 130/78mm HG in hypertensive participants with diabetes, heart failure or chronic kidney disease.          Education:Diabetes - Individual verbal and written instruction to review signs/symptoms of diabetes, desired ranges of glucose level fasting, after meals and with exercise. Acknowledge that pre and post exercise glucose checks will be done for 3 sessions at entry of program.   Core  Components/Risk Factors/Patient Goals Review:    Core Components/Risk Factors/Patient Goals at Discharge (Final Review):    ITP Comments:  ITP Comments     Row Name 06/06/24 1033 06/11/24 1209 06/19/24 0859 06/20/24 1004 07/18/24 0837   ITP Comments Initial phone call completed. Diagnosis can be found in [facility] 8/15. EP Orientation scheduled for Monday 9/15 at 10am. Completed and gym orientation for cardiac rehab. Initial ITP created and sent for review to Dr. Oneil Pinal,  Medical Director. First full day of exercise!  Patient was oriented to gym and equipment including functions, settings, policies, and procedures.  Patient's individual exercise prescription and treatment plan were reviewed.  All starting workloads were established based on the results of the 6 minute walk test done at initial orientation visit.  The plan for exercise progression was also introduced and progression will be customized based on patient's performance and goals. 30 Day review completed. Medical Director ITP review done, changes made as directed, and signed approval by Medical Director. New to program. 30 Day review completed. Medical Director ITP review done, changes made as directed, and signed approval by Medical Director.    Row Name 08/15/24 1006           ITP Comments 30 Day review completed. Medical Director ITP review done, changes made as directed, and signed approval by Medical Director.          Comments: 30 Day Review ITP

## 2024-08-16 ENCOUNTER — Encounter

## 2024-08-16 DIAGNOSIS — I513 Intracardiac thrombosis, not elsewhere classified: Secondary | ICD-10-CM | POA: Diagnosis not present

## 2024-08-16 DIAGNOSIS — I2510 Atherosclerotic heart disease of native coronary artery without angina pectoris: Secondary | ICD-10-CM | POA: Diagnosis not present

## 2024-08-16 DIAGNOSIS — I472 Ventricular tachycardia, unspecified: Secondary | ICD-10-CM | POA: Diagnosis not present

## 2024-08-16 DIAGNOSIS — I4901 Ventricular fibrillation: Secondary | ICD-10-CM | POA: Diagnosis not present

## 2024-08-16 DIAGNOSIS — Z79899 Other long term (current) drug therapy: Secondary | ICD-10-CM | POA: Diagnosis not present

## 2024-08-16 DIAGNOSIS — I5020 Unspecified systolic (congestive) heart failure: Secondary | ICD-10-CM | POA: Diagnosis not present

## 2024-08-21 ENCOUNTER — Encounter

## 2024-08-21 ENCOUNTER — Telehealth: Payer: Self-pay

## 2024-08-21 NOTE — Telephone Encounter (Signed)
 Semisi stated he'll back next week on 12/2, Tuesday.

## 2024-08-28 ENCOUNTER — Encounter

## 2024-08-28 DIAGNOSIS — I213 ST elevation (STEMI) myocardial infarction of unspecified site: Secondary | ICD-10-CM | POA: Insufficient documentation

## 2024-08-28 DIAGNOSIS — Z955 Presence of coronary angioplasty implant and graft: Secondary | ICD-10-CM | POA: Insufficient documentation

## 2024-08-30 ENCOUNTER — Encounter

## 2024-09-03 DIAGNOSIS — I472 Ventricular tachycardia, unspecified: Secondary | ICD-10-CM | POA: Diagnosis not present

## 2024-09-03 DIAGNOSIS — I5020 Unspecified systolic (congestive) heart failure: Secondary | ICD-10-CM | POA: Diagnosis not present

## 2024-09-03 DIAGNOSIS — Z01818 Encounter for other preprocedural examination: Secondary | ICD-10-CM | POA: Diagnosis not present

## 2024-09-03 DIAGNOSIS — I4901 Ventricular fibrillation: Secondary | ICD-10-CM | POA: Diagnosis not present

## 2024-09-04 ENCOUNTER — Encounter

## 2024-09-04 DIAGNOSIS — I2510 Atherosclerotic heart disease of native coronary artery without angina pectoris: Secondary | ICD-10-CM | POA: Diagnosis not present

## 2024-09-04 DIAGNOSIS — I5022 Chronic systolic (congestive) heart failure: Secondary | ICD-10-CM | POA: Diagnosis not present

## 2024-09-04 DIAGNOSIS — I10 Essential (primary) hypertension: Secondary | ICD-10-CM | POA: Diagnosis not present

## 2024-09-04 DIAGNOSIS — I2129 ST elevation (STEMI) myocardial infarction involving other sites: Secondary | ICD-10-CM | POA: Diagnosis not present

## 2024-09-06 ENCOUNTER — Encounter

## 2024-09-10 ENCOUNTER — Telehealth: Payer: Self-pay | Admitting: *Deleted

## 2024-09-10 ENCOUNTER — Telehealth: Payer: Self-pay

## 2024-09-10 NOTE — Telephone Encounter (Signed)
 Patient called back after message left that he would be discharged for lack of attendance. Patient stated he would like to come back but is sick with a cold. He plans to return to the program next Tuesday, December 23rd and continue with 2 days a week attendance there after.

## 2024-09-10 NOTE — Telephone Encounter (Signed)
 LVM. Informed patient that he has not attended Cardiac Rehab since 07/26/2024.

## 2024-09-11 ENCOUNTER — Encounter

## 2024-09-12 ENCOUNTER — Encounter: Payer: Self-pay | Admitting: *Deleted

## 2024-09-12 DIAGNOSIS — I213 ST elevation (STEMI) myocardial infarction of unspecified site: Secondary | ICD-10-CM

## 2024-09-12 DIAGNOSIS — Z955 Presence of coronary angioplasty implant and graft: Secondary | ICD-10-CM

## 2024-09-12 NOTE — Progress Notes (Signed)
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 Encouraged to exercise with the program and follow exercise prescription    Comments Mr. Conery is coming to cardiac rehab after a STEMI w/ stent. He has a history of ankylosing spondylitis and states the best thing for him is to move, so this program will help. He used to do a lot of weight training in the past but stopped when he was diagnosed with AS but is looking forward to getting into a regular exercise routine again. He was discharged with a life vest which has made sleep even more difficult. he has a long history of bad sleep, has tried different medications with no real help, and is going to try magnesium  that his doctor recently prescribed. 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    Expected Outcomes Short: attend cardiac rehab for education and exercise Long: develop and maintain positive self care habits     Continue Psychosocial Services  Follow up required by staff          Psychosocial Re-Evaluation:   Psychosocial Discharge (Final Psychosocial Re-Evaluation):   Vocational Rehabilitation: Provide vocational rehab assistance to qualifying candidates.   Vocational Rehab Evaluation & Intervention:  Vocational Rehab - 06/06/24 1015       Initial Vocational Rehab Evaluation & Intervention   Assessment shows need for Vocational Rehabilitation No          Education: Education Goals: Education classes will be provided on a variety of topics geared toward better understanding of heart health and risk factor modification. Participant will state understanding/return demonstration of topics presented as noted by education test scores.  Learning Barriers/Preferences:  Learning Barriers/Preferences - 06/06/24 1013       Learning Barriers/Preferences   Learning Barriers None    Learning Preferences None  General Cardiac Education Topics:  AED/CPR: - Group verbal and written instruction with the use of models to demonstrate the basic use of the AED with the basic ABC's of resuscitation.   Test and Procedures: - Group verbal and visual presentation and models provide information about basic cardiac anatomy and function. Reviews the testing methods done to diagnose heart disease and the outcomes of the test results. Describes the treatment choices: Medical Management, Angioplasty, or Coronary Bypass Surgery for treating various heart conditions including Myocardial Infarction, Angina, Valve Disease, and Cardiac Arrhythmias. Written material provided at class time. Flowsheet Row Cardiac Rehab from 06/11/2024 in [facility] Cardiac and Pulmonary Rehab  Education need identified 06/11/24    Medication Safety: - Group verbal and visual instruction to review commonly prescribed medications for heart and lung disease. Reviews the medication, class of the drug, and side effects. Includes  the steps to properly store meds and maintain the prescription regimen. Written material provided at class time.   Intimacy: - Group verbal instruction through game format to discuss how heart and lung disease can affect sexual intimacy. Written material provided at class time.   Know Your Numbers and Heart Failure: - Group verbal and visual instruction to discuss disease risk factors for cardiac and pulmonary disease and treatment options.  Reviews associated critical values for Overweight/Obesity, Hypertension, Cholesterol, and Diabetes.  Discusses basics of heart failure: signs/symptoms and treatments.  Introduces Heart Failure Zone chart for action plan for heart failure. Written material provided at class time. Flowsheet Row Cardiac Rehab from 06/11/2024 in [facility] Cardiac and Pulmonary Rehab  Education need identified 06/11/24    Infection Prevention: - Provides verbal and written material to individual with discussion of infection control including proper hand washing and proper equipment cleaning during exercise session. Flowsheet Row Cardiac Rehab from 06/11/2024 in [facility] Cardiac and Pulmonary Rehab  Date 06/11/24  Educator MB  Instruction Review Code 1- Verbalizes Understanding    Falls Prevention: - Provides verbal and written material to individual with discussion of falls prevention and safety. Flowsheet Row Cardiac Rehab from 06/11/2024 in [facility] Cardiac and Pulmonary Rehab  Date 06/11/24  Educator MB  Instruction Review Code 1- Verbalizes Understanding    Other: -Provides group and verbal instruction on various topics (see comments)   Knowledge Questionnaire Score:  Knowledge Questionnaire Score - 06/11/24 1312       Knowledge Questionnaire Score   Pre Score 21/26          Core Components/Risk Factors/Patient Goals at Admission:  Personal Goals and Risk Factors at Admission - 06/11/24 1312       Core Components/Risk Factors/Patient Goals on Admission    Weight  Management Yes;Weight Loss    Intervention Weight Management: Develop a combined nutrition and exercise program designed to reach desired caloric intake, while maintaining appropriate intake of nutrient and fiber, sodium and fats, and appropriate energy expenditure required for the weight goal.;Weight Management: Provide education and appropriate resources to help participant work on and attain dietary goals.;Weight Management/Obesity: Establish reasonable short term and long term weight goals.    Admit Weight 262 lb 1.6 oz (118.9 kg)    Goal Weight: Short Term 246 lb (111.6 kg)    Goal Weight: Long Term 230 lb (104.3 kg)    Expected Outcomes Short Term: Continue to assess and modify interventions until short term weight is achieved;Long Term: Adherence to nutrition and physical activity/exercise program aimed toward attainment of established weight goal;Understanding recommendations for meals to include 15-35% energy as protein,  25-35% energy from fat, 35-60% energy from carbohydrates, less than 200mg  of dietary cholesterol, 20-35 gm of total fiber daily;Understanding of distribution of calorie intake throughout the day with the consumption of 4-5 meals/snacks;Weight Loss: Understanding of general recommendations for a balanced deficit meal plan, which promotes 1-2 lb weight loss per week and includes a negative energy balance of [phone] kcal/d    Hypertension Yes    Intervention Provide education on lifestyle modifcations including regular physical activity/exercise, weight management, moderate sodium restriction and increased consumption of fresh fruit, vegetables, and low fat dairy, alcohol moderation, and smoking cessation.;Monitor prescription use compliance.    Expected Outcomes Long Term: Maintenance of blood pressure at goal levels.;Short Term: Continued assessment and intervention until BP is < 140/38mm HG in hypertensive participants. < 130/15mm HG in hypertensive participants with diabetes,  heart failure or chronic kidney disease.          Education:Diabetes - Individual verbal and written instruction to review signs/symptoms of diabetes, desired ranges of glucose level fasting, after meals and with exercise. Acknowledge that pre and post exercise glucose checks will be done for 3 sessions at entry of program.   Core Components/Risk Factors/Patient Goals Review:    Core Components/Risk Factors/Patient Goals at Discharge (Final Review):    ITP Comments:  ITP Comments     Row Name 06/06/24 1033 06/11/24 1209 06/19/24 0859 06/20/24 1004 07/18/24 0837   ITP Comments Initial phone call completed. Diagnosis can be found in [facility] 8/15. EP Orientation scheduled for Monday 9/15 at 10am. Completed and gym orientation for cardiac rehab. Initial ITP created and sent for review to Dr. Oneil Pinal, Medical Director. First full day of exercise!  Patient was oriented to gym and equipment including functions, settings, policies, and procedures.  Patient's individual exercise prescription and treatment plan were reviewed.  All starting workloads were established based on the results of the 6 minute walk test done at initial orientation visit.  The plan for exercise progression was also introduced and progression will be customized based on patient's performance and goals. 30 Day review completed. Medical Director ITP review done, changes made as directed, and signed approval by Medical Director. New to program. 30 Day review completed. Medical Director ITP review done, changes made as directed, and signed approval by Medical Director.    Row Name 08/15/24 1006 09/12/24 1046         ITP Comments 30 Day review completed. Medical Director ITP review done, changes made as directed, and signed approval by Medical Director. 30 Day review completed. Medical Director ITP review done, changes made as directed, and signed approval by Medical Director.         Comments: 30 Day Review     [1]   Current Outpatient Medications:    amiodarone  (NEXTERONE  PREMIX) 360-4.14 MG/200ML-% SOLN, Inject 30 mg/hr into the vein continuous., Disp: , Rfl:    aspirin  81 MG chewable tablet, Chew 1 tablet (81 mg total) by mouth daily., Disp: , Rfl:    atorvastatin  (LIPITOR ) 80 MG tablet, Take 1 tablet (80 mg total) by mouth daily., Disp: , Rfl:    Chlorhexidine  Gluconate Cloth 2 % PADS, Apply 6 each topically daily. (Patient not taking: Reported on 06/06/2024), Disp: , Rfl:    empagliflozin  (JARDIANCE ) 10 MG TABS tablet, Take 1 tablet (10 mg total) by mouth daily. (Patient not taking: Reported on 06/06/2024), Disp: , Rfl:    famotidine  (PEPCID ) 20 MG tablet, Take 1 tablet (20 mg total) by mouth 2 (two)  times daily. (Patient not taking: Reported on 06/06/2024), Disp: , Rfl:    fentaNYL  (SUBLIMAZE ) 50 MCG/ML injection, Inject 1 mL (50 mcg total) into the vein every 2 (two) hours as needed for severe pain (pain score 7-10). (Patient not taking: Reported on 06/06/2024), Disp: , Rfl:    heparin  25000 UT/250ML infusion, Inject 1,500 Units/hr into the vein continuous. (Patient not taking: Reported on 06/06/2024), Disp: , Rfl:    lidocaine  (LIDODERM ) 5 %, Place 1 patch onto the skin daily. Remove & Discard patch within 12 hours or as directed by MD (Patient not taking: Reported on 06/06/2024), Disp: , Rfl:    losartan  (COZAAR ) 25 MG tablet, Take 0.5 tablets (12.5 mg total) by mouth daily., Disp: , Rfl:    metoprolol  succinate (TOPROL -XL) 50 MG 24 hr tablet, Take 1 tablet (50 mg total) by mouth 2 (two) times daily. Take with or immediately following a meal., Disp: , Rfl:    Mouthwashes (MOUTH RINSE) LIQD solution, 15 mLs by Mouth Rinse route as needed (for oral care)., Disp: , Rfl:    nitroGLYCERIN (NITROSTAT) 0.4 MG SL tablet, Place 0.4 mg under the tongue every 5 (five) minutes as needed for chest pain., Disp: , Rfl:    ondansetron  (ZOFRAN ) 4 MG/2ML SOLN injection, Inject 2 mLs (4 mg total) into the vein every 6 (six)  hours as needed for nausea. (Patient not taking: Reported on 06/06/2024), Disp: , Rfl:    ticagrelor  (BRILINTA ) 90 MG TABS tablet, Take 1 tablet (90 mg total) by mouth 2 (two) times daily. (Patient not taking: Reported on 06/06/2024), Disp: , Rfl:  [2]  Social History Tobacco Use  Smoking Status Former   Current packs/day: 0.30   Types: Cigarettes   Passive exposure: Past  Smokeless Tobacco Never  Tobacco Comments   started smoking at age 49

## 2024-09-13 ENCOUNTER — Encounter

## 2024-09-18 ENCOUNTER — Encounter

## 2024-09-19 ENCOUNTER — Encounter

## 2024-09-19 DIAGNOSIS — I213 ST elevation (STEMI) myocardial infarction of unspecified site: Secondary | ICD-10-CM

## 2024-09-19 DIAGNOSIS — Z955 Presence of coronary angioplasty implant and graft: Secondary | ICD-10-CM

## 2024-09-19 NOTE — Progress Notes (Signed)
 Daily Session Note  Patient Details  Name: Joshua Bartlett MRN: [id number] Date of Birth: 1981/07/18 Referring Provider:   Flowsheet Row Cardiac Rehab from 06/11/2024 in [facility] Cardiac and Pulmonary Rehab  Referring Provider Leita Allean Qualia, MD    Encounter Date: 09/19/2024  Check In:  Session Check In - 09/19/24 1106       Check-In   Supervising physician immediately available to respond to emergencies See telemetry face sheet for immediately available ER MD    Location [facility]-Cardiac & Pulmonary Rehab    Staff Present Burnard Davenport RN,BSN,MPA;Joseph [facility] RCP,RRT,BSRT;Laura Cates RN,BSN;Noah Tickle, [place], Exercise Physiologist    Virtual Visit No    Medication changes reported     No    Fall or balance concerns reported    No    Warm-up and Cool-down Performed on first and last piece of equipment    Resistance Training Performed Yes    VAD Patient? No    PAD/SET Patient? No      Pain Assessment   Currently in Pain? No/denies             Tobacco Use History[1]  Goals Met:  Independence with exercise equipment Exercise tolerated well No report of concerns or symptoms today Strength training completed today  Goals Unmet:  Not Applicable  Comments: Pt able to follow exercise prescription today without complaint.  Will continue to monitor for progression.    Dr. Oneil Pinal is Medical Director for [facility] Cardiac Rehabilitation.  Dr. Fuad Aleskerov is Medical Director for [facility] Pulmonary Rehabilitation.    [1]  Social History Tobacco Use  Smoking Status Former   Current packs/day: 0.30   Types: Cigarettes   Passive exposure: Past  Smokeless Tobacco Never  Tobacco Comments   started smoking at age 23

## 2024-09-25 ENCOUNTER — Encounter

## 2024-09-26 ENCOUNTER — Encounter

## 2024-09-26 DIAGNOSIS — Z955 Presence of coronary angioplasty implant and graft: Secondary | ICD-10-CM | POA: Diagnosis not present

## 2024-09-26 DIAGNOSIS — I213 ST elevation (STEMI) myocardial infarction of unspecified site: Secondary | ICD-10-CM

## 2024-09-26 NOTE — Progress Notes (Signed)
 Daily Session Note  Patient Details  Name: Joshua Bartlett MRN: [id number] Date of Birth: [birth year] Referring Provider:   Flowsheet Row Cardiac Rehab from 06/11/2024 in [facility] Cardiac and Pulmonary Rehab  Referring Provider Leita Allean Qualia, MD    Encounter Date: 09/26/2024  Check In:  Session Check In - 09/26/24 1535       Check-In   Supervising physician immediately available to respond to emergencies See telemetry face sheet for immediately available ER MD    Location [facility]-Cardiac & Pulmonary Rehab    Staff Present Burnard Davenport RN,BSN,MPA;Laura Cates RN,BSN;Joseph Rolinda NORWOOD HARMAN Cecilie Daring, BS, RRT, CPFT    Virtual Visit No    Medication changes reported     No    Fall or balance concerns reported    No    Warm-up and Cool-down Performed on first and last piece of equipment    Resistance Training Performed Yes    VAD Patient? No    PAD/SET Patient? No      Pain Assessment   Currently in Pain? No/denies             Tobacco Use History[1]  Goals Met:  Independence with exercise equipment Exercise tolerated well No report of concerns or symptoms today Strength training completed today  Goals Unmet:  Not Applicable  Comments: Pt able to follow exercise prescription today without complaint.  Will continue to monitor for progression.    Dr. Oneil Pinal is Medical Director for [facility] Cardiac Rehabilitation.  Dr. Fuad Aleskerov is Medical Director for [facility] Pulmonary Rehabilitation.    [1]  Social History Tobacco Use  Smoking Status Former   Current packs/day: 0.30   Types: Cigarettes   Passive exposure: Past  Smokeless Tobacco Never  Tobacco Comments   started smoking at age 39

## 2024-09-27 ENCOUNTER — Encounter

## 2024-10-02 ENCOUNTER — Encounter: Attending: Cardiology

## 2024-10-02 ENCOUNTER — Encounter

## 2024-10-02 DIAGNOSIS — I252 Old myocardial infarction: Secondary | ICD-10-CM | POA: Insufficient documentation

## 2024-10-02 DIAGNOSIS — Z955 Presence of coronary angioplasty implant and graft: Secondary | ICD-10-CM | POA: Insufficient documentation

## 2024-10-02 DIAGNOSIS — Z48812 Encounter for surgical aftercare following surgery on the circulatory system: Secondary | ICD-10-CM | POA: Insufficient documentation

## 2024-10-04 ENCOUNTER — Encounter

## 2024-10-04 ENCOUNTER — Encounter: Admitting: *Deleted

## 2024-10-04 DIAGNOSIS — Z48812 Encounter for surgical aftercare following surgery on the circulatory system: Secondary | ICD-10-CM | POA: Diagnosis present

## 2024-10-04 DIAGNOSIS — I252 Old myocardial infarction: Secondary | ICD-10-CM | POA: Diagnosis not present

## 2024-10-04 DIAGNOSIS — I213 ST elevation (STEMI) myocardial infarction of unspecified site: Secondary | ICD-10-CM | POA: Diagnosis present

## 2024-10-04 DIAGNOSIS — Z955 Presence of coronary angioplasty implant and graft: Secondary | ICD-10-CM | POA: Diagnosis present

## 2024-10-04 NOTE — Progress Notes (Signed)
 Daily Session Note  Patient Details  Name: Joshua Bartlett MRN: [id number] Date of Birth: [birth year] Referring Provider:   Flowsheet Row Cardiac Rehab from 06/11/2024 in [facility] Cardiac and Pulmonary Rehab  Referring Provider Leita Allean Qualia, MD    Encounter Date: 10/04/2024  Check In:  Session Check In - 10/04/24 1414       Check-In   Supervising physician immediately available to respond to emergencies See telemetry face sheet for immediately available ER MD    Location [facility]-Cardiac & Pulmonary Rehab    Staff Present Othel Durand, RN, BSN, CCRP;Meredith Tressa RN,BSN;Joseph Hood RCP,RRT,BSRT;Noah Tickle, [place], Exercise Physiologist    Virtual Visit No    Medication changes reported     No    Fall or balance concerns reported    No    Warm-up and Cool-down Performed on first and last piece of equipment    Resistance Training Performed Yes    VAD Patient? No    PAD/SET Patient? No      Pain Assessment   Currently in Pain? No/denies             Tobacco Use History[1]  Goals Met:  Independence with exercise equipment Exercise tolerated well No report of concerns or symptoms today  Goals Unmet:  Not Applicable  Comments: Pt able to follow exercise prescription today without complaint.  Will continue to monitor for progression.    Dr. Oneil Pinal is Medical Director for [facility] Cardiac Rehabilitation.  Dr. Fuad Aleskerov is Medical Director for [facility] Pulmonary Rehabilitation.    [1]  Social History Tobacco Use  Smoking Status Former   Current packs/day: 0.30   Types: Cigarettes   Passive exposure: Past  Smokeless Tobacco Never  Tobacco Comments   started smoking at age 55

## 2024-10-09 ENCOUNTER — Encounter

## 2024-10-10 DIAGNOSIS — I213 ST elevation (STEMI) myocardial infarction of unspecified site: Secondary | ICD-10-CM

## 2024-10-10 DIAGNOSIS — Z955 Presence of coronary angioplasty implant and graft: Secondary | ICD-10-CM

## 2024-10-10 NOTE — Progress Notes (Signed)
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 Life or Depression concerns found by scoring the questionnaire.;Long Term goal: The participant improves quality of Life and PHQ9 Scores as seen by post scores and/or verbalization of changes          Quality of Life Scores:   Quality of Life - 06/11/24 1311       Quality of Life   Select Quality of Life      Quality of Life Scores   Health/Function Pre 15.77 %    Socioeconomic Pre 24.21 %    Psych/Spiritual Pre 14.57 %    Family Pre 21 %    GLOBAL Pre 17.94 %         Scores of 19 and below usually indicate a poorer quality of life in these areas.  A difference of  2-3 points is a clinically meaningful difference.  A difference of 2-3 points in the total score of the Quality of Life Index has been associated with significant improvement in overall quality of life, self-image, physical symptoms, and general health in studies  assessing change in quality of life.  PHQ-9: Review Flowsheet  More data exists      06/11/2024 08/02/2023 07/05/2023 02/16/2023 07/23/2021  Depression screen PHQ 2/9  Decreased Interest 3 0 1 1 0  Down, Depressed, Hopeless 2 0 0 0  0  PHQ - 2 Score 5 0 1 1 0  Altered sleeping 3 2 2  0 1  Tired, decreased energy 3 1 2  0 0  Change in appetite 0 1 0 0 0  Feeling bad or failure about yourself  0 0 0 0 0  Trouble concentrating 1 0 2 1 1   Moving slowly or fidgety/restless 2 0 0 0 0  Suicidal thoughts 0 0 0 0 0  PHQ-9 Score 14  4  7  2  2    Difficult doing work/chores Somewhat difficult - Somewhat difficult Not difficult at all Somewhat difficult    Details       Data saved with a previous flowsheet row definition        Interpretation of Total Score  Total Score Depression Severity:  1-4 = Minimal depression, 5-9 = Mild depression, 10-14 = Moderate depression, 15-19 = Moderately severe depression, 20-27 = Severe depression   Psychosocial Evaluation and Intervention:  Psychosocial Evaluation - 06/06/24 1025       Psychosocial Evaluation & Interventions   Interventions Encouraged to exercise with the program and follow exercise prescription    Comments Mr. Skibicki is coming to cardiac rehab after a STEMI w/ stent. He has a history of ankylosing spondylitis and states the best thing for him is to move, so this program will help. He used to do a lot of weight training in the past but stopped when he was diagnosed with AS but is looking forward to getting into a regular exercise routine again. He was discharged with a life vest which has made sleep even more difficult. he has a long history of bad sleep, has tried different medications with no real help, and is going to try magnesium  that his doctor recently prescribed. 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    Expected Outcomes Short: attend cardiac rehab for education and exercise Long:  develop and maintain positive self care habits    Continue Psychosocial Services  Follow up required by staff          Psychosocial Re-Evaluation:   Psychosocial Discharge (Final Psychosocial Re-Evaluation):   Vocational Rehabilitation: Provide vocational rehab assistance to qualifying candidates.   Vocational Rehab Evaluation & Intervention:  Vocational Rehab - 06/06/24 1015       Initial Vocational Rehab Evaluation & Intervention   Assessment shows need for Vocational Rehabilitation No          Education: Education Goals: Education classes will be provided on a variety of topics geared toward better understanding of heart health and risk factor modification. Participant will state understanding/return demonstration of topics presented as noted by education test scores.  Learning Barriers/Preferences:  Learning Barriers/Preferences - 06/06/24 1013       Learning Barriers/Preferences   Learning Barriers None    Learning Preferences None          General Cardiac Education Topics:  AED/CPR: - Group verbal and written instruction with the use of models to demonstrate the basic use of the AED with the basic ABC's of resuscitation.   Test and Procedures: - Group verbal and visual presentation and models provide information about basic cardiac anatomy and function. Reviews the testing methods done to diagnose heart disease and the outcomes of the test results. Describes the treatment choices: Medical Management, Angioplasty, or Coronary Bypass Surgery for treating various heart conditions including Myocardial Infarction, Angina, Valve Disease, and Cardiac Arrhythmias. Written material  provided at class time. Flowsheet Row Cardiac Rehab from 06/11/2024 in [facility] Cardiac [facility]  Education need identified 06/11/24    Medication Safety: - Group verbal and visual instruction to review commonly prescribed medications for heart and lung disease. Reviews the  medication, class of the drug, and side effects. Includes the steps to properly store meds and maintain the prescription regimen. Written material provided at class time.   Intimacy: - Group verbal instruction through game format to discuss how heart and lung disease can affect sexual intimacy. Written material provided at class time.   Know Your Numbers and Heart Failure: - Group verbal and visual instruction to discuss disease risk factors for cardiac and pulmonary disease and treatment options.  Reviews associated critical values for Overweight/Obesity, Hypertension, Cholesterol, and Diabetes.  Discusses basics of heart failure: signs/symptoms and treatments.  Introduces Heart Failure Zone chart for action plan for heart failure. Written material provided at class time. Flowsheet Row Cardiac Rehab from 06/11/2024 in [facility] Cardiac and Pulmonary Rehab  Education need identified 06/11/24    Infection Prevention: - Provides verbal and written material to individual with discussion of infection control including proper hand washing and proper equipment cleaning during exercise session. Flowsheet Row Cardiac Rehab from 06/11/2024 in [facility] Cardiac [facility] Re[facility]  Date 06/11/24  Educator MB  Instruction Review Code 1- Verbalizes Understanding    Falls Prevention: - Provides verbal and written material to individual with discussion of falls prevention and safety. Flowsheet Row [facility]iac [facility]hab [facility]  Date 06/11/24  Educator MB  Instruction Review Code 1- Verbalizes Understanding    Other: -Provides group and verbal instruction on various topics (see comments)   Knowledge Questionnaire Score:  Knowledge Questionnaire Score - 06/11/24 1312       Knowledge Questionnaire Score   Pre Score 21/26          Core Components/Risk Factors/Patient Goals at Admission:  Personal Goals and Risk Factors at Admission - 06/11/24 1312       Core  Components/Risk Factors/Patient Goals on Admission    Weight Management Yes;Weight Loss    Intervention Weight Management: Develop a combined nutrition and exercise program designed to reach desired caloric intake, while maintaining appropriate intake of nutrient and fiber, sodium and fats, and appropriate energy expenditure required for the weight goal.;Weight Management: Provide education and appropriate resources to help participant work on and attain dietary goals.;Weight Management/Obesity: Establish reasonable short term and long term weight goals.    Admit Weight 262 lb 1.6 oz (118.9 kg)    Goal Weight: Short Term 246 lb (111.6 kg)    Goal Weight: Long Term 230 lb (104.3 kg)    Expected Outcomes Short Term: Continue to assess and modify interventions until short term weight is achieved;Long Term: Adherence to nutrition and physical activity/exercise program aimed toward attainment of established weight goal;Understanding recommendations for meals to include 15-35% energy as protein, 25-35% energy from fat, 35-60% energy from carbohydrates, less than 200mg  of dietary cholesterol, 20-35 gm of total fiber daily;Understanding of distribution of calorie intake throughout the day with the consumption of 4-5 meals/snacks;Weight Loss: Understanding of general recommendations for a balanced deficit meal plan, which promotes 1-2 lb weight loss per week and includes a negative energy balance of [phone] kcal/d    Hypertension Yes    Intervention Provide education on lifestyle modifcations including regular physical activity/exercise, weight management, moderate sodium restriction and increased consumption of fresh fruit, vegetables, and low fat  dairy, alcohol moderation, and smoking cessation.;Monitor prescription use compliance.    Expected Outcomes Long Term: Maintenance of blood pressure at goal levels.;Short Term: Continued assessment and intervention until BP is < 140/85mm HG in hypertensive participants.  < 130/34mm HG in hypertensive participants with diabetes, heart failure or chronic kidney disease.          Education:Diabetes - Individual verbal and written instruction to review signs/symptoms of diabetes, desired ranges of glucose level fasting, after meals and with exercise. Acknowledge that pre and post exercise glucose checks will be done for 3 sessions at entry of program.   Core Components/Risk Factors/Patient Goals Review:    Core Components/Risk Factors/Patient Goals at Discharge (Final Review):    ITP Comments:  ITP Comments     Row Name 06/06/24 1033 06/11/24 1209 06/19/24 0859 06/20/24 1004 07/18/24 0837   ITP Comments Initial phone call completed. Diagnosis can be found in [facility] 8/15. EP Orientation scheduled for Monday 9/15 at 10am. Completed and gym orientation for cardiac rehab. Initial ITP created and sent for review to Dr. Oneil Pinal, Medical Director. First full day of exercise!  Patient was oriented to gym and equipment including functions, settings, policies, and procedures.  Patient's individual exercise prescription and treatment plan were reviewed.  All starting workloads were established based on the results of the 6 minute walk test done at initial orientation visit.  The plan for exercise progression was also introduced and progression will be customized based on patient's performance and goals. 30 Day review completed. Medical Director ITP review done, changes made as directed, and signed approval by Medical Director. New to program. 30 Day review completed. Medical Director ITP review done, changes made as directed, and signed approval by Medical Director.    Row Name 08/15/24 1006 09/12/24 1046 10/10/24 0758       ITP Comments 30 Day review completed. Medical Director ITP review done, changes made as directed, and signed approval by Medical Director. 30 Day review completed. Medical Director ITP review done, changes made as directed, and signed approval by  Medical Director. 30 Day review completed. Medical Director ITP review done, changes made as directed, and signed approval by Medical Director. Patient has not been to rehab routinely and has not had a review due to attendance.        Comments: 30 day review      [1]  Current Outpatient Medications:    amiodarone  (NEXTERONE  PREMIX) 360-4.14 MG/200ML-% SOLN, Inject 30 mg/hr into the vein continuous., Disp: , Rfl:    aspirin  81 MG chewable tablet, Chew 1 tablet (81 mg total) by mouth daily., Disp: , Rfl:    atorvastatin  (LIPITOR ) 80 MG tablet, Take 1 tablet (80 mg total) by mouth daily., Disp: , Rfl:    Chlorhexidine  Gluconate Cloth 2 % PADS, Apply 6 each topically daily. (Patient not taking: Reported on 06/06/2024), Disp: , Rfl:    empagliflozin  (JARDIANCE ) 10 MG TABS tablet, Take 1 tablet (10 mg total) by mouth daily. (Patient not taking: Reported on 06/06/2024), Disp: , Rfl:    famotidine  (PEPCID ) 20 MG tablet, Take 1 tablet (20 mg total) by mouth 2 (two) times daily. (Patient not taking: Reported on 06/06/2024), Disp: , Rfl:    fentaNYL  (SUBLIMAZE ) 50 MCG/ML injection, Inject 1 mL (50 mcg total) into the vein every 2 (two) hours as needed for severe pain (pain score 7-10). (Patient not taking: Reported on 06/06/2024), Disp: , Rfl:    heparin  25000 UT/250ML infusion, Inject 1,500 Units/hr into  the vein continuous. (Patient not taking: Reported on 06/06/2024), Disp: , Rfl:    lidocaine  (LIDODERM ) 5 %, Place 1 patch onto the skin daily. Remove & Discard patch within 12 hours or as directed by MD (Patient not taking: Reported on 06/06/2024), Disp: , Rfl:    losartan  (COZAAR ) 25 MG tablet, Take 0.5 tablets (12.5 mg total) by mouth daily., Disp: , Rfl:    metoprolol  succinate (TOPROL -XL) 50 MG 24 hr tablet, Take 1 tablet (50 mg total) by mouth 2 (two) times daily. Take with or immediately following a meal., Disp: , Rfl:    Mouthwashes (MOUTH RINSE) LIQD solution, 15 mLs by Mouth Rinse route as needed  (for oral care)., Disp: , Rfl:    nitroGLYCERIN (NITROSTAT) 0.4 MG SL tablet, Place 0.4 mg under the tongue every 5 (five) minutes as needed for chest pain., Disp: , Rfl:    ondansetron  (ZOFRAN ) 4 MG/2ML SOLN injection, Inject 2 mLs (4 mg total) into the vein every 6 (six) hours as needed for nausea. (Patient not taking: Reported on 06/06/2024), Disp: , Rfl:    ticagrelor  (BRILINTA ) 90 MG TABS tablet, Take 1 tablet (90 mg total) by mouth 2 (two) times daily. (Patient not taking: Reported on 06/06/2024), Disp: , Rfl:  [2]  Social History Tobacco Use  Smoking Status Former   Current packs/day: 0.30   Types: Cigarettes   Passive exposure: Past  Smokeless Tobacco Never  Tobacco Comments   started smoking at age 61

## 2024-10-10 NOTE — Progress Notes (Signed)
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 Quality of Life or Depression concerns found by scoring the questionnaire.;Long Term goal: The participant improves quality of Life and PHQ9 Scores as seen by post scores and/or verbalization of changes          Quality of Life Scores:   Quality of Life - 06/11/24 1311       Quality of Life   Select Quality of Life      Quality of Life Scores   Health/Function Pre 15.77 %    Socioeconomic Pre 24.21 %    Psych/Spiritual Pre 14.57 %    Family Pre 21 %    GLOBAL Pre 17.94 %         Scores of 19 and below usually indicate a poorer quality of life in these areas.  A difference of  2-3 points is a clinically meaningful difference.  A difference of 2-3 points in the total score of the Quality of Life Index has been associated with significant improvement in overall quality of life, self-image, physical symptoms, and general health in studies  assessing change in quality of life.  PHQ-9: Review Flowsheet  More data exists      06/11/2024 08/02/2023 07/05/2023 02/16/2023 07/23/2021  Depression screen PHQ 2/9  Decreased Interest 3 0 1 1 0  Down, Depressed, Hopeless 2 0 0 0  0  PHQ - 2 Score 5 0 1 1 0  Altered sleeping 3 2 2  0 1  Tired, decreased energy 3 1 2  0 0  Change in appetite 0 1 0 0 0  Feeling bad or failure about yourself  0 0 0 0 0  Trouble concentrating 1 0 2 1 1   Moving slowly or fidgety/restless 2 0 0 0 0  Suicidal thoughts 0 0 0 0 0  PHQ-9 Score 14  4  7  2  2    Difficult doing work/chores Somewhat difficult - Somewhat difficult Not difficult at all Somewhat difficult    Details       Data saved with a previous flowsheet row definition        Interpretation of Total Score  Total Score Depression Severity:  1-4 = Minimal depression, 5-9 = Mild depression, 10-14 = Moderate depression, 15-19 = Moderately severe depression, 20-27 = Severe depression   Psychosocial Evaluation and Intervention:  Psychosocial Evaluation - 06/06/24 1025       Psychosocial Evaluation & Interventions   Interventions Encouraged to exercise with the program and follow exercise prescription    Comments Mr. Skibicki is coming to cardiac rehab after a STEMI w/ stent. He has a history of ankylosing spondylitis and states the best thing for him is to move, so this program will help. He used to do a lot of weight training in the past but stopped when he was diagnosed with AS but is looking forward to getting into a regular exercise routine again. He was discharged with a life vest which has made sleep even more difficult. he has a long history of bad sleep, has tried different medications with no real help, and is going to try magnesium  that his doctor recently prescribed. 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    Expected Outcomes Short: attend cardiac rehab for education and exercise Long:  develop and maintain positive self care habits    Continue Psychosocial Services  Follow up required by staff          Psychosocial Re-Evaluation:   Psychosocial Discharge (Final Psychosocial Re-Evaluation):   Vocational Rehabilitation: Provide vocational rehab assistance to qualifying candidates.   Vocational Rehab Evaluation & Intervention:  Vocational Rehab - 06/06/24 1015       Initial Vocational Rehab Evaluation & Intervention   Assessment shows need for Vocational Rehabilitation No          Education: Education Goals: Education classes will be provided on a variety of topics geared toward better understanding of heart health and risk factor modification. Participant will state understanding/return demonstration of topics presented as noted by education test scores.  Learning Barriers/Preferences:  Learning Barriers/Preferences - 06/06/24 1013       Learning Barriers/Preferences   Learning Barriers None    Learning Preferences None          General Cardiac Education Topics:  AED/CPR: - Group verbal and written instruction with the use of models to demonstrate the basic use of the AED with the basic ABC's of resuscitation.   Test and Procedures: - Group verbal and visual presentation and models provide information about basic cardiac anatomy and function. Reviews the testing methods done to diagnose heart disease and the outcomes of the test results. Describes the treatment choices: Medical Management, Angioplasty, or Coronary Bypass Surgery for treating various heart conditions including Myocardial Infarction, Angina, Valve Disease, and Cardiac Arrhythmias. Written material  provided at class time. Flowsheet Row Cardiac Rehab from 06/11/2024 in [facility] Cardiac and Pulmonary Rehab  Education need identified 06/11/24    Medication Safety: - Group verbal and visual instruction to review commonly prescribed medications for heart and lung disease. Reviews the  medication, class of the drug, and side effects. Includes the steps to properly store meds and maintain the prescription regimen. Written material provided at class time.   Intimacy: - Group verbal instruction through game format to discuss how heart and lung disease can affect sexual intimacy. Written material provided at class time.   Know Your Numbers and Heart Failure: - Group verbal and visual instruction to discuss disease risk factors for cardiac and pulmonary disease and treatment options.  Reviews associated critical values for Overweight/Obesity, Hypertension, Cholesterol, and Diabetes.  Discusses basics of heart failure: signs/symptoms and treatments.  Introduces Heart Failure Zone chart for action plan for heart failure. Written material provided at class time. Flowsheet Row Cardiac Rehab from 06/11/2024 in [facility] Cardiac and Pulmonary Rehab  Education need identified 06/11/24    Infection Prevention: - Provides verbal and written material to individual with discussion of infection control including proper hand washing and proper equipment cleaning during exercise session. Flowsheet Row Cardiac Rehab from 06/11/2024 in [facility] Cardiac and Pulmonary Rehab  Date 06/11/24  Educator MB  Instruction Review Code 1- Verbalizes Understanding    Falls Prevention: - Provides verbal and written material to individual with discussion of falls prevention and safety. Flowsheet Row Cardiac Rehab from 06/11/2024 in [facility] Cardiac and Pulmonary Rehab  Date 06/11/24  Educator MB  Instruction Review Code 1- Verbalizes Understanding    Other: -Provides group and verbal instruction on various topics (see comments)   Knowledge Questionnaire Score:  Knowledge Questionnaire Score - 06/11/24 1312       Knowledge Questionnaire Score   Pre Score 21/26          Core Components/Risk Factors/Patient Goals at Admission:  Personal Goals and Risk Factors at Admission - 06/11/24 1312       Core  Components/Risk Factors/Patient Goals on Admission    Weight Management Yes;Weight Loss    Intervention Weight Management: Develop a combined nutrition and exercise program designed to reach desired caloric intake, while maintaining appropriate intake of nutrient and fiber, sodium and fats, and appropriate energy expenditure required for the weight goal.;Weight Management: Provide education and appropriate resources to help participant work on and attain dietary goals.;Weight Management/Obesity: Establish reasonable short term and long term weight goals.    Admit Weight 262 lb 1.6 oz (118.9 kg)    Goal Weight: Short Term 246 lb (111.6 kg)    Goal Weight: Long Term 230 lb (104.3 kg)    Expected Outcomes Short Term: Continue to assess and modify interventions until short term weight is achieved;Long Term: Adherence to nutrition and physical activity/exercise program aimed toward attainment of established weight goal;Understanding recommendations for meals to include 15-35% energy as protein, 25-35% energy from fat, 35-60% energy from carbohydrates, less than 200mg  of dietary cholesterol, 20-35 gm of total fiber daily;Understanding of distribution of calorie intake throughout the day with the consumption of 4-5 meals/snacks;Weight Loss: Understanding of general recommendations for a balanced deficit meal plan, which promotes 1-2 lb weight loss per week and includes a negative energy balance of [phone] kcal/d    Hypertension Yes    Intervention Provide education on lifestyle modifcations including regular physical activity/exercise, weight management, moderate sodium restriction and increased consumption of fresh fruit, vegetables, and low fat  dairy, alcohol moderation, and smoking cessation.;Monitor prescription use compliance.    Expected Outcomes Long Term: Maintenance of blood pressure at goal levels.;Short Term: Continued assessment and intervention until BP is < 140/85mm HG in hypertensive participants.  < 130/34mm HG in hypertensive participants with diabetes, heart failure or chronic kidney disease.          Education:Diabetes - Individual verbal and written instruction to review signs/symptoms of diabetes, desired ranges of glucose level fasting, after meals and with exercise. Acknowledge that pre and post exercise glucose checks will be done for 3 sessions at entry of program.   Core Components/Risk Factors/Patient Goals Review:    Core Components/Risk Factors/Patient Goals at Discharge (Final Review):    ITP Comments:  ITP Comments     Row Name 06/06/24 1033 06/11/24 1209 06/19/24 0859 06/20/24 1004 07/18/24 0837   ITP Comments Initial phone call completed. Diagnosis can be found in [facility] 8/15. EP Orientation scheduled for Monday 9/15 at 10am. Completed and gym orientation for cardiac rehab. Initial ITP created and sent for review to Dr. Oneil Pinal, Medical Director. First full day of exercise!  Patient was oriented to gym and equipment including functions, settings, policies, and procedures.  Patient's individual exercise prescription and treatment plan were reviewed.  All starting workloads were established based on the results of the 6 minute walk test done at initial orientation visit.  The plan for exercise progression was also introduced and progression will be customized based on patient's performance and goals. 30 Day review completed. Medical Director ITP review done, changes made as directed, and signed approval by Medical Director. New to program. 30 Day review completed. Medical Director ITP review done, changes made as directed, and signed approval by Medical Director.    Row Name 08/15/24 1006 09/12/24 1046 10/10/24 0758       ITP Comments 30 Day review completed. Medical Director ITP review done, changes made as directed, and signed approval by Medical Director. 30 Day review completed. Medical Director ITP review done, changes made as directed, and signed approval by  Medical Director. 30 Day review completed. Medical Director ITP review done, changes made as directed, and signed approval by Medical Director. Patient has not been to rehab routinely and has not had a review due to attendance.        Comments: 30 day review      [1]  Current Outpatient Medications:    amiodarone  (NEXTERONE  PREMIX) 360-4.14 MG/200ML-% SOLN, Inject 30 mg/hr into the vein continuous., Disp: , Rfl:    aspirin  81 MG chewable tablet, Chew 1 tablet (81 mg total) by mouth daily., Disp: , Rfl:    atorvastatin  (LIPITOR ) 80 MG tablet, Take 1 tablet (80 mg total) by mouth daily., Disp: , Rfl:    Chlorhexidine  Gluconate Cloth 2 % PADS, Apply 6 each topically daily. (Patient not taking: Reported on 06/06/2024), Disp: , Rfl:    empagliflozin  (JARDIANCE ) 10 MG TABS tablet, Take 1 tablet (10 mg total) by mouth daily. (Patient not taking: Reported on 06/06/2024), Disp: , Rfl:    famotidine  (PEPCID ) 20 MG tablet, Take 1 tablet (20 mg total) by mouth 2 (two) times daily. (Patient not taking: Reported on 06/06/2024), Disp: , Rfl:    fentaNYL  (SUBLIMAZE ) 50 MCG/ML injection, Inject 1 mL (50 mcg total) into the vein every 2 (two) hours as needed for severe pain (pain score 7-10). (Patient not taking: Reported on 06/06/2024), Disp: , Rfl:    heparin  25000 UT/250ML infusion, Inject 1,500 Units/hr into  the vein continuous. (Patient not taking: Reported on 06/06/2024), Disp: , Rfl:    lidocaine  (LIDODERM ) 5 %, Place 1 patch onto the skin daily. Remove & Discard patch within 12 hours or as directed by MD (Patient not taking: Reported on 06/06/2024), Disp: , Rfl:    losartan  (COZAAR ) 25 MG tablet, Take 0.5 tablets (12.5 mg total) by mouth daily., Disp: , Rfl:    metoprolol  succinate (TOPROL -XL) 50 MG 24 hr tablet, Take 1 tablet (50 mg total) by mouth 2 (two) times daily. Take with or immediately following a meal., Disp: , Rfl:    Mouthwashes (MOUTH RINSE) LIQD solution, 15 mLs by Mouth Rinse route as needed  (for oral care)., Disp: , Rfl:    nitroGLYCERIN (NITROSTAT) 0.4 MG SL tablet, Place 0.4 mg under the tongue every 5 (five) minutes as needed for chest pain., Disp: , Rfl:    ondansetron  (ZOFRAN ) 4 MG/2ML SOLN injection, Inject 2 mLs (4 mg total) into the vein every 6 (six) hours as needed for nausea. (Patient not taking: Reported on 06/06/2024), Disp: , Rfl:    ticagrelor  (BRILINTA ) 90 MG TABS tablet, Take 1 tablet (90 mg total) by mouth 2 (two) times daily. (Patient not taking: Reported on 06/06/2024), Disp: , Rfl:  [2]  Social History Tobacco Use  Smoking Status Former   Current packs/day: 0.30   Types: Cigarettes   Passive exposure: Past  Smokeless Tobacco Never  Tobacco Comments   started smoking at age 61

## 2024-10-11 ENCOUNTER — Encounter

## 2024-10-16 ENCOUNTER — Encounter

## 2024-10-18 ENCOUNTER — Encounter: Admitting: Emergency Medicine

## 2024-10-18 DIAGNOSIS — Z955 Presence of coronary angioplasty implant and graft: Secondary | ICD-10-CM

## 2024-10-18 DIAGNOSIS — I213 ST elevation (STEMI) myocardial infarction of unspecified site: Secondary | ICD-10-CM

## 2024-10-18 DIAGNOSIS — Z48812 Encounter for surgical aftercare following surgery on the circulatory system: Secondary | ICD-10-CM | POA: Diagnosis not present

## 2024-10-18 NOTE — Progress Notes (Signed)
 Daily Session Note  Patient Details  Name: Joshua Bartlett MRN: [id number] Date of Birth: Jun 05, 1981 Referring Provider:   Flowsheet Row Cardiac Rehab from 06/11/2024 in [facility] Cardiac and Pulmonary Rehab  Referring Provider Leita Allean Qualia, MD    Encounter Date: 10/18/2024  Check In:  Session Check In - 10/18/24 1726       Check-In   Supervising physician immediately available to respond to emergencies See telemetry face sheet for immediately available ER MD    Location [facility]-Cardiac & Pulmonary Rehab    Staff Present Leita Franks RN,BSN;Joseph [facility] RCP,RRT,BSRT;Margaret Best, MS, Exercise Physiologist    Virtual Visit No    Medication changes reported     No    Fall or balance concerns reported    No    Warm-up and Cool-down Performed on first and last piece of equipment    Resistance Training Performed Yes    VAD Patient? No    PAD/SET Patient? No      Pain Assessment   Currently in Pain? No/denies             Tobacco Use History[1]  Goals Met:  Independence with exercise equipment Exercise tolerated well No report of concerns or symptoms today Strength training completed today  Goals Unmet:  Not Applicable  Comments: Pt able to follow exercise prescription today without complaint.  Will continue to monitor for progression.    Dr. Oneil Pinal is Medical Director for [facility] Cardiac Rehabilitation.  Dr. Fuad Aleskerov is Medical Director for [facility] Pulmonary Rehabilitation.     [1]  Social History Tobacco Use  Smoking Status Former   Current packs/day: 0.30   Types: Cigarettes   Passive exposure: Past  Smokeless Tobacco Never  Tobacco Comments   started smoking at age 74

## 2024-10-23 ENCOUNTER — Encounter

## 2024-10-25 ENCOUNTER — Encounter

## 2024-10-30 ENCOUNTER — Encounter

## 2024-10-31 ENCOUNTER — Telehealth: Payer: Self-pay

## 2024-10-31 NOTE — Telephone Encounter (Signed)
 Spoke with Joshua Bartlett, he reports he will come to rehab tomorrow to graduate and be discharged

## 2024-11-01 ENCOUNTER — Encounter: Admitting: *Deleted

## 2024-11-01 DIAGNOSIS — I213 ST elevation (STEMI) myocardial infarction of unspecified site: Secondary | ICD-10-CM

## 2024-11-01 DIAGNOSIS — Z955 Presence of coronary angioplasty implant and graft: Secondary | ICD-10-CM

## 2024-11-01 NOTE — Progress Notes (Signed)
 Daily Session Note  Patient Details  Name: Joshua Bartlett MRN: [id number] Date of Birth: [birth year] Referring Provider:   Flowsheet Row Cardiac Rehab from 06/11/2024 in [facility] Cardiac and Pulmonary Rehab  Referring Provider Leita Allean Qualia, MD    Encounter Date: 11/01/2024  Check In:  Session Check In - 11/01/24 1543       Check-In   Supervising physician immediately available to respond to emergencies See telemetry face sheet for immediately available ER MD    Location [facility]-Cardiac & Pulmonary Rehab    Staff Present Hoy Rodney RN,BSN;Joseph [facility] RCP,RRT,BSRT;Noah Tickle, [place], Exercise Physiologist;Laura Cates RN,BSN    Virtual Visit No    Medication changes reported     No    Fall or balance concerns reported    No    Warm-up and Cool-down Performed on first and last piece of equipment    Resistance Training Performed Yes    VAD Patient? No    PAD/SET Patient? No      Pain Assessment   Currently in Pain? No/denies             Tobacco Use History[1]  Goals Met:  Independence with exercise equipment Exercise tolerated well No report of concerns or symptoms today Strength training completed today  Goals Unmet:  Not Applicable  Comments:  [name] graduated early from  rehab with 13 sessions completed.  Details of the patient's exercise prescription and what He needs to do in order to continue the prescription and progress were discussed with patient.  Patient was given a copy of prescription and goals.  Patient verbalized understanding. Valon plans to continue to exercise by walking.    Dr. Oneil Pinal is Medical Director for [facility] Cardiac Rehabilitation.  Dr. Fuad Aleskerov is Medical Director for [facility] Pulmonary Rehabilitation.    [1]  Social History Tobacco Use  Smoking Status Former   Current packs/day: 0.30   Types: Cigarettes   Passive exposure: Past  Smokeless Tobacco Never  Tobacco Comments   started smoking at age 8

## 2024-11-01 NOTE — Progress Notes (Signed)
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 Re-Evaluation):   Psychosocial: Target Goals: Acknowledge presence or absence of significant depression and/or stress, maximize coping skills, provide positive support system. Participant is able to verbalize types and ability to use techniques and skills needed for reducing stress and depression.   Education: Stress, Anxiety, and Depression - Group verbal and visual presentation to define topics covered.  Reviews how body is impacted by stress, anxiety, and depression.  Also discusses healthy ways to reduce stress and to treat/manage anxiety and depression. Written material provided at class time.   Education: Sleep Hygiene -Provides group verbal and written instruction about how sleep can affect your health.  Define sleep hygiene, discuss sleep cycles and impact of sleep habits. Review good sleep hygiene tips.   Initial Review & Psychosocial Screening:  Initial Psych Review & Screening - 06/06/24 1013       Initial Review    Current issues with Current Sleep Concerns      Family Dynamics   Good Support System? Yes      Barriers   Psychosocial barriers to participate in program There are no identifiable barriers or psychosocial needs.;The patient should benefit from training in stress management and relaxation.      Screening Interventions   Interventions Encouraged to exercise;Provide feedback about the scores to participant;To provide support and resources with identified psychosocial needs    Expected Outcomes Short Term goal: Utilizing psychosocial counselor, staff and physician to assist with  identification of specific Stressors or current issues interfering with healing process. Setting desired goal for each stressor or current issue identified.;Long Term Goal: Stressors or current issues are controlled or eliminated.;Short Term goal: Identification and review with participant of any Quality of Life or Depression concerns found by scoring the questionnaire.;Long Term goal: The participant improves quality of Life and PHQ9 Scores as seen by post scores and/or verbalization of changes          Quality of Life Scores:   Quality of Life - 06/11/24 1311       Quality of Life   Select Quality of Life      Quality of Life Scores   Health/Function Pre 15.77 %    Socioeconomic Pre 24.21 %    Psych/Spiritual Pre 14.57 %    Family Pre 21 %    GLOBAL Pre 17.94 %         Scores of 19 and below usually indicate a poorer quality of life in these areas.  A difference of  2-3 points is a clinically meaningful difference.  A difference of 2-3 points in the total score of the Quality of Life Index has been associated with significant improvement in overall quality of life, self-image, physical symptoms, and general health in studies assessing change in quality of life.  PHQ-9: Review Flowsheet  More data exists      06/11/2024 08/02/2023 07/05/2023 02/16/2023 07/23/2021  Depression screen PHQ 2/9  Decreased  Interest 3 0 1 1 0  Down, Depressed, Hopeless 2 0 0 0 0  PHQ - 2 Score 5 0 1 1 0  Altered sleeping 3 2 2  0 1  Tired, decreased energy 3 1 2  0 0  Change in appetite 0 1 0 0 0  Feeling bad or failure about yourself  0 0 0 0 0  Trouble concentrating 1 0 2 1 1   Moving slowly or fidgety/restless 2 0 0 0 0  Suicidal thoughts 0 0 0 0 0  PHQ-9 Score 14  4  7  2  2    Difficult doing work/chores Somewhat difficult - Somewhat difficult Not difficult at all Somewhat difficult    Details       Data saved with a previous flowsheet row definition        Interpretation of Total Score  Total Score Depression Severity:  1-4 = Minimal depression, 5-9 = Mild depression, 10-14 = Moderate depression, 15-19 = Moderately severe depression, 20-27 = Severe depression   Psychosocial Evaluation and Intervention:  Psychosocial Evaluation - 06/06/24 1025       Psychosocial Evaluation & Interventions   Interventions Encouraged to exercise with the program and follow exercise prescription    Comments Mr. Breck is coming to cardiac rehab after a STEMI w/ stent. He has a history of ankylosing spondylitis and states the best thing for him is to move, so this program will help. He used to do a lot of weight training in the past but stopped when he was diagnosed with AS but is looking forward to getting into a regular exercise routine again. He was discharged with a life vest which has made sleep even more difficult. he has a long history of bad sleep, has tried different medications with no real help, and is going to try magnesium  that his doctor recently prescribed. 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    Expected Outcomes Short: attend cardiac rehab for  education and exercise Long: develop and maintain positive self care habits    Continue Psychosocial Services  Follow up required by staff          Psychosocial Re-Evaluation:   Psychosocial Discharge  (Final Psychosocial Re-Evaluation):   Vocational Rehabilitation: Provide vocational rehab assistance to qualifying candidates.   Vocational Rehab Evaluation & Intervention:  Vocational Rehab - 06/06/24 1015       Initial Vocational Rehab Evaluation & Intervention   Assessment shows need for Vocational Rehabilitation No          Education: Education Goals: Education classes will be provided on a variety of topics geared toward better understanding of heart health and risk factor modification. Participant will state understanding/return demonstration of topics presented as noted by education test scores.  Learning Barriers/Preferences:  Learning Barriers/Preferences - 06/06/24 1013       Learning Barriers/Preferences   Learning Barriers None    Learning Preferences None          General Cardiac Education Topics:  AED/CPR: - Group verbal and written instruction with the use of models to demonstrate the basic use of the AED with the basic ABC's of resuscitation.   Test and Procedures: - Group verbal and visual presentation and models provide information about basic cardiac anatomy and function. Reviews the testing methods done to diagnose heart disease and the outcomes of the test results. Describes the treatment choices: Medical Management, Angioplasty, or Coronary Bypass Surgery for treating various heart conditions including Myocardial Infarction, Angina, Valve Disease, and Cardiac Arrhythmias. Written material provided at class time. Flowsheet Row Cardiac Rehab from 06/11/2024 in [facility] Cardiac and Pulmonary Rehab  Education need identified 06/11/24    Medication Safety: - Group verbal and visual instruction to review commonly prescribed medications for heart and lung disease. Reviews the medication, class of the drug, and side effects. Includes the steps to properly store meds and maintain the prescription regimen. Written material provided at class time.   Intimacy: -  Group verbal instruction through game format to discuss how heart and lung disease can affect sexual intimacy. Written material provided at class time.   Know Your Numbers and Heart Failure: - Group verbal and visual instruction to discuss disease risk factors for cardiac and pulmonary disease and treatment options.  Reviews associated critical values for Overweight/Obesity, Hypertension, Cholesterol, and Diabetes.  Discusses basics of heart failure: signs/symptoms and treatments.  Introduces Heart Failure Zone chart for action plan for heart failure. Written material provided at class time. Flowsheet Row Cardiac Rehab from 06/11/2024 in [facility] Cardiac and Pulmonary Rehab  Education need identified 06/11/24    Infection Prevention: - Provides verbal and written material to individual with discussion of infection control including proper hand washing and proper equipment cleaning during exercise session. Flowsheet Row Cardiac Rehab from 06/11/2024 in [facility] Cardiac and Pulmonary Rehab  Date 06/11/24  Educator MB  Instruction Review Code 1- Verbalizes Understanding    Falls Prevention: - Provides verbal and written material to individual with discussion of falls prevention and safety. Flowsheet Row Cardiac Rehab from 06/11/2024 in [facility] Cardiac and Pulmonary Rehab  Date 06/11/24  Educator MB  Instruction Review Code 1- Verbalizes Understanding    Other: -Provides group and verbal instruction on various topics (see comments)   Knowledge Questionnaire Score:  Knowledge Questionnaire Score - 06/11/24 1312       Knowledge Questionnaire Score   Pre Score 21/26          Core Components/Risk Factors/Patient Goals at Admission:  Personal Goals and Risk Factors at Admission - 06/11/24 1312       Core Components/Risk Factors/Patient Goals on Admission    Weight Management Yes;Weight Loss    Intervention Weight Management: Develop a combined nutrition and exercise program designed to reach  desired caloric intake, while maintaining appropriate intake of nutrient and fiber, sodium and fats, and appropriate energy expenditure required for the weight goal.;Weight Management: Provide education and appropriate resources to help participant work on and attain dietary goals.;Weight Management/Obesity: Establish reasonable short term and long term weight goals.    Admit Weight 262 lb 1.6 oz (118.9 kg)    Goal Weight: Short Term 246 lb (111.6 kg)    Goal Weight: Long Term 230 lb (104.3 kg)    Expected Outcomes Short Term: Continue to assess and modify interventions until short term weight is achieved;Long Term: Adherence to nutrition and physical activity/exercise program aimed toward attainment of established weight goal;Understanding recommendations for meals to include 15-35% energy as protein, 25-35% energy from fat, 35-60% energy from carbohydrates, less than 200mg  of dietary cholesterol, 20-35 gm of total fiber daily;Understanding of distribution of calorie intake throughout the day with the consumption of 4-5 meals/snacks;Weight Loss: Understanding of general recommendations for a balanced deficit meal plan, which promotes 1-2 lb weight loss per week and includes a negative energy balance of [phone] kcal/d    Hypertension Yes    Intervention Provide education on lifestyle modifcations including regular physical activity/exercise, weight management, moderate sodium restriction and increased consumption of fresh fruit, vegetables, and low fat dairy, alcohol moderation, and smoking cessation.;Monitor prescription use compliance.    Expected Outcomes Long Term: Maintenance of blood pressure at goal levels.;Short Term: Continued assessment and intervention until BP is < 140/64mm HG in hypertensive participants. < 130/12mm HG in hypertensive participants with diabetes, heart failure or chronic kidney disease.          Education:Diabetes - Individual verbal and written instruction to review  signs/symptoms of diabetes, desired ranges of glucose level fasting, after meals and with exercise. Acknowledge that pre and post exercise glucose checks will be done for 3 sessions at entry of program.   Core Components/Risk Factors/Patient Goals Review:    Core Components/Risk Factors/Patient Goals at Discharge (Final Review):    ITP Comments:  ITP Comments     Row Name 06/06/24 1033 06/11/24 1209 06/19/24 0859 06/20/24 1004 07/18/24 0837   ITP Comments Initial phone call completed. Diagnosis can be found in [facility] 8/15. EP Orientation scheduled for Monday 9/15 at 10am. Completed and gym orientation for cardiac rehab. Initial ITP created and sent for review to Dr. Oneil Pinal, Medical Director. First full day of exercise!  Patient was oriented to gym and equipment including functions, settings, policies, and procedures.  Patient's individual exercise prescription and treatment plan were reviewed.  All starting workloads were established based on the results of the 6 minute walk test done at initial orientation visit.  The plan for exercise progression was also introduced and progression will be customized based on patient's performance and goals. 30 Day review completed. Medical Director ITP review done, changes made as directed, and signed approval by Medical Director. New to program. 30 Day review completed. Medical Director ITP review done, changes made as directed, and signed approval by Medical Director.    Row Name 08/15/24 1006 09/12/24 1046 10/10/24 0758 11/01/24 1545     ITP Comments 30 Day review completed. Medical Director ITP review done, changes made as directed, and signed approval by  Medical Director. 30 Day review completed. Medical Director ITP review done, changes made as directed, and signed approval by Medical Director. 30 Day review completed. Medical Director ITP review done, changes made as directed, and signed approval by Medical Director. Patient has not been to rehab  routinely and has not had a review due to attendance. Joshua Bartlett graduated early from  rehab with 13 sessions completed.  Details of the patient's exercise prescription and what He needs to do in order to continue the prescription and progress were discussed with patient.  Patient was given a copy of prescription and goals.  Patient verbalized understanding. Joshua Bartlett plans to continue to exercise by walking.       Comments: Early DC ITP     [1]  Current Outpatient Medications:    amiodarone  (NEXTERONE  PREMIX) 360-4.14 MG/200ML-% SOLN, Inject 30 mg/hr into the vein continuous., Disp: , Rfl:    aspirin  81 MG chewable tablet, Chew 1 tablet (81 mg total) by mouth daily., Disp: , Rfl:    atorvastatin  (LIPITOR ) 80 MG tablet, Take 1 tablet (80 mg total) by mouth daily., Disp: , Rfl:    Chlorhexidine  Gluconate Cloth 2 % PADS, Apply 6 each topically daily. (Patient not taking: Reported on 06/06/2024), Disp: , Rfl:    empagliflozin  (JARDIANCE ) 10 MG TABS tablet, Take 1 tablet (10 mg total) by mouth daily. (Patient not taking: Reported on 06/06/2024), Disp: , Rfl:    famotidine  (PEPCID ) 20 MG tablet, Take 1 tablet (20 mg total) by mouth 2 (two) times daily. (Patient not taking: Reported on 06/06/2024), Disp: , Rfl:    fentaNYL  (SUBLIMAZE ) 50 MCG/ML injection, Inject 1 mL (50 mcg total) into the vein every 2 (two) hours as needed for severe pain (pain score 7-10). (Patient not taking: Reported on 06/06/2024), Disp: , Rfl:    heparin  25000 UT/250ML infusion, Inject 1,500 Units/hr into the vein continuous. (Patient not taking: Reported on 06/06/2024), Disp: , Rfl:    lidocaine  (LIDODERM ) 5 %, Place 1 patch onto the skin daily. Remove & Discard patch within 12 hours or as directed by MD (Patient not taking: Reported on 06/06/2024), Disp: , Rfl:    losartan  (COZAAR ) 25 MG tablet, Take 0.5 tablets (12.5 mg total) by mouth daily., Disp: , Rfl:    metoprolol  succinate (TOPROL -XL) 50 MG 24 hr tablet, Take 1 tablet (50 mg total)  by mouth 2 (two) times daily. Take with or immediately following a meal., Disp: , Rfl:    Mouthwashes (MOUTH RINSE) LIQD solution, 15 mLs by Mouth Rinse route as needed (for oral care)., Disp: , Rfl:    nitroGLYCERIN (NITROSTAT) 0.4 MG SL tablet, Place 0.4 mg under the tongue every 5 (five) minutes as needed for chest pain., Disp: , Rfl:    ondansetron  (ZOFRAN ) 4 MG/2ML SOLN injection, Inject 2 mLs (4 mg total) into the vein every 6 (six) hours as needed for nausea. (Patient not taking: Reported on 06/06/2024), Disp: , Rfl:    ticagrelor  (BRILINTA ) 90 MG TABS tablet, Take 1 tablet (90 mg total) by mouth 2 (two) times daily. (Patient not taking: Reported on 06/06/2024), Disp: , Rfl:  [2]  Social History Tobacco Use  Smoking Status Former   Current packs/day: 0.30   Types: Cigarettes   Passive exposure: Past  Smokeless Tobacco Never  Tobacco Comments   started smoking at age 37

## 2024-11-01 NOTE — Progress Notes (Signed)
 Early Discharge Summary  Joshua Bartlett 1981/01/03  Joshua Bartlett is discharging early due to lack of attendance related to work and health. He completed 13 of 36 sessions.  Details of the patient's exercise prescription and what He needs to do in order to continue the prescription and progress were discussed with patient.  Patient was given a copy of prescription and goals.  Patient verbalized understanding. Miracle plans to continue to exercise by walking.   6 Minute Walk     Row Name 06/11/24 1302         6 Minute Walk   Phase Initial     Distance 1405 feet     Walk Time 6 minutes     # of Rest Breaks 0     MPH 2.66     METS 4.61     RPE 7     Perceived Dyspnea  0     VO2 Peak 16.13     Symptoms No     Resting HR 74 bpm     Resting BP 118/74     Resting Oxygen Saturation  98 %     Exercise Oxygen Saturation  during 6 min walk 97 %     Max Ex. HR 117 bpm     Max Ex. BP 130/62     2 Minute Post BP 108/72

## 2024-11-06 ENCOUNTER — Encounter

## 2024-11-08 ENCOUNTER — Encounter

## 2024-11-13 ENCOUNTER — Encounter

## 2024-11-15 ENCOUNTER — Encounter

## 2024-11-20 ENCOUNTER — Encounter

## 2024-11-22 ENCOUNTER — Encounter
